# Patient Record
Sex: Male | Born: 1965 | Race: White | Hispanic: No | Marital: Single | Smoking: Current every day smoker
Health system: Southern US, Community
[De-identification: ages and names within clinical notes are randomized; demographics above are authoritative.]

## PROBLEM LIST (undated history)

## (undated) DIAGNOSIS — K859 Acute pancreatitis without necrosis or infection, unspecified: Secondary | ICD-10-CM

## (undated) DIAGNOSIS — S129XXA Fracture of neck, unspecified, initial encounter: Secondary | ICD-10-CM

## (undated) DIAGNOSIS — I10 Essential (primary) hypertension: Secondary | ICD-10-CM

## (undated) DIAGNOSIS — F319 Bipolar disorder, unspecified: Secondary | ICD-10-CM

## (undated) DIAGNOSIS — F333 Major depressive disorder, recurrent, severe with psychotic symptoms: Secondary | ICD-10-CM

## (undated) DIAGNOSIS — F1190 Opioid use, unspecified, uncomplicated: Secondary | ICD-10-CM

## (undated) DIAGNOSIS — G4700 Insomnia, unspecified: Secondary | ICD-10-CM

## (undated) DIAGNOSIS — F1010 Alcohol abuse, uncomplicated: Secondary | ICD-10-CM

## (undated) DIAGNOSIS — D649 Anemia, unspecified: Secondary | ICD-10-CM

## (undated) DIAGNOSIS — Z87898 Personal history of other specified conditions: Secondary | ICD-10-CM

---

## 2006-01-03 ENCOUNTER — Emergency Department: Payer: Self-pay | Admitting: Emergency Medicine

## 2006-02-03 ENCOUNTER — Emergency Department: Payer: Self-pay | Admitting: Emergency Medicine

## 2006-04-11 ENCOUNTER — Emergency Department: Payer: Self-pay | Admitting: Emergency Medicine

## 2006-04-13 ENCOUNTER — Emergency Department: Payer: Self-pay | Admitting: Internal Medicine

## 2011-03-21 ENCOUNTER — Emergency Department: Payer: Self-pay | Admitting: Unknown Physician Specialty

## 2012-07-12 DIAGNOSIS — Z87898 Personal history of other specified conditions: Secondary | ICD-10-CM | POA: Insufficient documentation

## 2013-08-25 DIAGNOSIS — K861 Other chronic pancreatitis: Secondary | ICD-10-CM | POA: Insufficient documentation

## 2013-08-25 DIAGNOSIS — F1020 Alcohol dependence, uncomplicated: Secondary | ICD-10-CM | POA: Insufficient documentation

## 2014-02-26 DIAGNOSIS — IMO0002 Reserved for concepts with insufficient information to code with codable children: Secondary | ICD-10-CM | POA: Insufficient documentation

## 2014-02-26 DIAGNOSIS — Z5982 Transportation insecurity: Secondary | ICD-10-CM | POA: Insufficient documentation

## 2014-03-20 DIAGNOSIS — K2981 Duodenitis with bleeding: Secondary | ICD-10-CM | POA: Insufficient documentation

## 2014-05-22 DIAGNOSIS — D649 Anemia, unspecified: Secondary | ICD-10-CM | POA: Insufficient documentation

## 2014-05-22 DIAGNOSIS — I10 Essential (primary) hypertension: Secondary | ICD-10-CM | POA: Insufficient documentation

## 2014-06-25 DIAGNOSIS — G8929 Other chronic pain: Secondary | ICD-10-CM | POA: Insufficient documentation

## 2014-07-22 DIAGNOSIS — F333 Major depressive disorder, recurrent, severe with psychotic symptoms: Secondary | ICD-10-CM | POA: Insufficient documentation

## 2014-07-22 DIAGNOSIS — G40409 Other generalized epilepsy and epileptic syndromes, not intractable, without status epilepticus: Secondary | ICD-10-CM | POA: Insufficient documentation

## 2014-10-02 DIAGNOSIS — K859 Acute pancreatitis without necrosis or infection, unspecified: Secondary | ICD-10-CM | POA: Insufficient documentation

## 2015-03-27 DIAGNOSIS — M958 Other specified acquired deformities of musculoskeletal system: Secondary | ICD-10-CM | POA: Insufficient documentation

## 2015-03-27 DIAGNOSIS — G5621 Lesion of ulnar nerve, right upper limb: Secondary | ICD-10-CM | POA: Insufficient documentation

## 2015-03-28 DIAGNOSIS — G8929 Other chronic pain: Secondary | ICD-10-CM | POA: Insufficient documentation

## 2015-03-28 DIAGNOSIS — M19019 Primary osteoarthritis, unspecified shoulder: Secondary | ICD-10-CM | POA: Insufficient documentation

## 2015-03-28 DIAGNOSIS — S248XXA Injury of other specified nerves of thorax, initial encounter: Secondary | ICD-10-CM | POA: Insufficient documentation

## 2015-10-30 DIAGNOSIS — R339 Retention of urine, unspecified: Secondary | ICD-10-CM | POA: Insufficient documentation

## 2015-10-30 DIAGNOSIS — R350 Frequency of micturition: Secondary | ICD-10-CM | POA: Insufficient documentation

## 2015-10-30 DIAGNOSIS — N3943 Post-void dribbling: Secondary | ICD-10-CM | POA: Insufficient documentation

## 2015-10-30 DIAGNOSIS — R351 Nocturia: Secondary | ICD-10-CM | POA: Insufficient documentation

## 2015-11-02 DIAGNOSIS — N5203 Combined arterial insufficiency and corporo-venous occlusive erectile dysfunction: Secondary | ICD-10-CM | POA: Insufficient documentation

## 2015-11-02 DIAGNOSIS — R3914 Feeling of incomplete bladder emptying: Secondary | ICD-10-CM | POA: Insufficient documentation

## 2016-11-09 DIAGNOSIS — L409 Psoriasis, unspecified: Secondary | ICD-10-CM | POA: Insufficient documentation

## 2019-06-07 ENCOUNTER — Encounter: Payer: Self-pay | Admitting: *Deleted

## 2019-06-07 ENCOUNTER — Emergency Department: Payer: Medicaid Other

## 2019-06-07 ENCOUNTER — Encounter
Admission: EM | Disposition: A | Discharge status: Home Health | Payer: Self-pay | Source: Home / Self Care | Attending: Internal Medicine

## 2019-06-07 ENCOUNTER — Other Ambulatory Visit: Payer: Self-pay

## 2019-06-07 ENCOUNTER — Inpatient Hospital Stay
Admission: EM | Admit: 2019-06-07 | Discharge: 2019-07-09 | DRG: 252 | Disposition: A | Discharge status: Home Health | Payer: Medicaid Other | Attending: Internal Medicine | Admitting: Internal Medicine

## 2019-06-07 DIAGNOSIS — Z515 Encounter for palliative care: Secondary | ICD-10-CM | POA: Diagnosis not present

## 2019-06-07 DIAGNOSIS — F17200 Nicotine dependence, unspecified, uncomplicated: Secondary | ICD-10-CM | POA: Diagnosis present

## 2019-06-07 DIAGNOSIS — I10 Essential (primary) hypertension: Secondary | ICD-10-CM | POA: Diagnosis present

## 2019-06-07 DIAGNOSIS — Z8669 Personal history of other diseases of the nervous system and sense organs: Secondary | ICD-10-CM

## 2019-06-07 DIAGNOSIS — Z20828 Contact with and (suspected) exposure to other viral communicable diseases: Secondary | ICD-10-CM | POA: Diagnosis present

## 2019-06-07 DIAGNOSIS — F250 Schizoaffective disorder, bipolar type: Secondary | ICD-10-CM | POA: Diagnosis present

## 2019-06-07 DIAGNOSIS — J9602 Acute respiratory failure with hypercapnia: Secondary | ICD-10-CM | POA: Diagnosis not present

## 2019-06-07 DIAGNOSIS — I998 Other disorder of circulatory system: Secondary | ICD-10-CM | POA: Diagnosis not present

## 2019-06-07 DIAGNOSIS — I70203 Unspecified atherosclerosis of native arteries of extremities, bilateral legs: Secondary | ICD-10-CM

## 2019-06-07 DIAGNOSIS — J156 Pneumonia due to other aerobic Gram-negative bacteria: Secondary | ICD-10-CM | POA: Diagnosis not present

## 2019-06-07 DIAGNOSIS — J984 Other disorders of lung: Secondary | ICD-10-CM

## 2019-06-07 DIAGNOSIS — G4700 Insomnia, unspecified: Secondary | ICD-10-CM | POA: Diagnosis present

## 2019-06-07 DIAGNOSIS — J150 Pneumonia due to Klebsiella pneumoniae: Secondary | ICD-10-CM | POA: Diagnosis not present

## 2019-06-07 DIAGNOSIS — F1290 Cannabis use, unspecified, uncomplicated: Secondary | ICD-10-CM | POA: Diagnosis present

## 2019-06-07 DIAGNOSIS — I959 Hypotension, unspecified: Secondary | ICD-10-CM | POA: Diagnosis not present

## 2019-06-07 DIAGNOSIS — M79662 Pain in left lower leg: Secondary | ICD-10-CM

## 2019-06-07 DIAGNOSIS — R0989 Other specified symptoms and signs involving the circulatory and respiratory systems: Secondary | ICD-10-CM

## 2019-06-07 DIAGNOSIS — R230 Cyanosis: Secondary | ICD-10-CM | POA: Diagnosis present

## 2019-06-07 DIAGNOSIS — Z981 Arthrodesis status: Secondary | ICD-10-CM | POA: Diagnosis not present

## 2019-06-07 DIAGNOSIS — Z01818 Encounter for other preprocedural examination: Secondary | ICD-10-CM

## 2019-06-07 DIAGNOSIS — Z885 Allergy status to narcotic agent status: Secondary | ICD-10-CM

## 2019-06-07 DIAGNOSIS — J690 Pneumonitis due to inhalation of food and vomit: Secondary | ICD-10-CM | POA: Diagnosis not present

## 2019-06-07 DIAGNOSIS — G9341 Metabolic encephalopathy: Secondary | ICD-10-CM | POA: Diagnosis not present

## 2019-06-07 DIAGNOSIS — Z8719 Personal history of other diseases of the digestive system: Secondary | ICD-10-CM

## 2019-06-07 DIAGNOSIS — Y92010 Kitchen of single-family (private) house as the place of occurrence of the external cause: Secondary | ICD-10-CM

## 2019-06-07 DIAGNOSIS — J9600 Acute respiratory failure, unspecified whether with hypoxia or hypercapnia: Secondary | ICD-10-CM

## 2019-06-07 DIAGNOSIS — S82851A Displaced trimalleolar fracture of right lower leg, initial encounter for closed fracture: Secondary | ICD-10-CM

## 2019-06-07 DIAGNOSIS — Q7290 Unspecified reduction defect of unspecified lower limb: Secondary | ICD-10-CM

## 2019-06-07 DIAGNOSIS — R410 Disorientation, unspecified: Secondary | ICD-10-CM | POA: Diagnosis not present

## 2019-06-07 DIAGNOSIS — M79661 Pain in right lower leg: Secondary | ICD-10-CM

## 2019-06-07 DIAGNOSIS — X501XXA Overexertion from prolonged static or awkward postures, initial encounter: Secondary | ICD-10-CM

## 2019-06-07 DIAGNOSIS — I70221 Atherosclerosis of native arteries of extremities with rest pain, right leg: Principal | ICD-10-CM | POA: Diagnosis present

## 2019-06-07 DIAGNOSIS — A498 Other bacterial infections of unspecified site: Secondary | ICD-10-CM | POA: Diagnosis not present

## 2019-06-07 DIAGNOSIS — J9601 Acute respiratory failure with hypoxia: Secondary | ICD-10-CM

## 2019-06-07 DIAGNOSIS — E538 Deficiency of other specified B group vitamins: Secondary | ICD-10-CM | POA: Diagnosis present

## 2019-06-07 DIAGNOSIS — Z4659 Encounter for fitting and adjustment of other gastrointestinal appliance and device: Secondary | ICD-10-CM

## 2019-06-07 DIAGNOSIS — W010XXA Fall on same level from slipping, tripping and stumbling without subsequent striking against object, initial encounter: Secondary | ICD-10-CM | POA: Diagnosis present

## 2019-06-07 DIAGNOSIS — F419 Anxiety disorder, unspecified: Secondary | ICD-10-CM | POA: Diagnosis present

## 2019-06-07 DIAGNOSIS — J9690 Respiratory failure, unspecified, unspecified whether with hypoxia or hypercapnia: Secondary | ICD-10-CM

## 2019-06-07 DIAGNOSIS — F10231 Alcohol dependence with withdrawal delirium: Secondary | ICD-10-CM | POA: Diagnosis not present

## 2019-06-07 DIAGNOSIS — I745 Embolism and thrombosis of iliac artery: Secondary | ICD-10-CM | POA: Diagnosis present

## 2019-06-07 DIAGNOSIS — M7989 Other specified soft tissue disorders: Secondary | ICD-10-CM

## 2019-06-07 DIAGNOSIS — Z7189 Other specified counseling: Secondary | ICD-10-CM | POA: Diagnosis not present

## 2019-06-07 DIAGNOSIS — I82409 Acute embolism and thrombosis of unspecified deep veins of unspecified lower extremity: Secondary | ICD-10-CM

## 2019-06-07 DIAGNOSIS — F05 Delirium due to known physiological condition: Secondary | ICD-10-CM | POA: Diagnosis not present

## 2019-06-07 DIAGNOSIS — F1120 Opioid dependence, uncomplicated: Secondary | ICD-10-CM | POA: Diagnosis present

## 2019-06-07 DIAGNOSIS — Z9114 Patient's other noncompliance with medication regimen: Secondary | ICD-10-CM

## 2019-06-07 DIAGNOSIS — Z95828 Presence of other vascular implants and grafts: Secondary | ICD-10-CM

## 2019-06-07 DIAGNOSIS — R0902 Hypoxemia: Secondary | ICD-10-CM

## 2019-06-07 DIAGNOSIS — Z79899 Other long term (current) drug therapy: Secondary | ICD-10-CM

## 2019-06-07 DIAGNOSIS — Z0189 Encounter for other specified special examinations: Secondary | ICD-10-CM

## 2019-06-07 DIAGNOSIS — Z781 Physical restraint status: Secondary | ICD-10-CM

## 2019-06-07 DIAGNOSIS — S82891A Other fracture of right lower leg, initial encounter for closed fracture: Secondary | ICD-10-CM

## 2019-06-07 HISTORY — DX: Bipolar disorder, unspecified: F31.9

## 2019-06-07 HISTORY — DX: Major depressive disorder, recurrent, severe with psychotic symptoms: F33.3

## 2019-06-07 HISTORY — DX: Acute pancreatitis without necrosis or infection, unspecified: K85.90

## 2019-06-07 HISTORY — PX: LOWER EXTREMITY ANGIOGRAPHY: CATH118251

## 2019-06-07 HISTORY — DX: Insomnia, unspecified: G47.00

## 2019-06-07 HISTORY — DX: Personal history of other specified conditions: Z87.898

## 2019-06-07 HISTORY — DX: Fracture of neck, unspecified, initial encounter: S12.9XXA

## 2019-06-07 HISTORY — DX: Opioid use, unspecified, uncomplicated: F11.90

## 2019-06-07 HISTORY — DX: Essential (primary) hypertension: I10

## 2019-06-07 HISTORY — DX: Anemia, unspecified: D64.9

## 2019-06-07 HISTORY — DX: Alcohol abuse, uncomplicated: F10.10

## 2019-06-07 LAB — PROTIME-INR
INR: 1 (ref 0.8–1.2)
Prothrombin Time: 12.7 seconds (ref 11.4–15.2)

## 2019-06-07 LAB — BASIC METABOLIC PANEL
Anion gap: 8 (ref 5–15)
BUN: 18 mg/dL (ref 6–20)
CO2: 25 mmol/L (ref 22–32)
Calcium: 8.5 mg/dL — ABNORMAL LOW (ref 8.9–10.3)
Chloride: 103 mmol/L (ref 98–111)
Creatinine, Ser: 1.58 mg/dL — ABNORMAL HIGH (ref 0.61–1.24)
GFR calc Af Amer: 57 mL/min — ABNORMAL LOW (ref >60)
GFR calc non Af Amer: 49 mL/min — ABNORMAL LOW (ref >60)
Glucose, Bld: 96 mg/dL (ref 70–99)
Potassium: 4.3 mmol/L (ref 3.5–5.1)
Sodium: 136 mmol/L (ref 135–145)

## 2019-06-07 LAB — CBC
HCT: 46.7 % (ref 39.0–52.0)
Hemoglobin: 15.6 g/dL (ref 13.0–17.0)
MCH: 33.9 pg (ref 26.0–34.0)
MCHC: 33.4 g/dL (ref 30.0–36.0)
MCV: 101.5 fL — ABNORMAL HIGH (ref 80.0–100.0)
Platelets: 131 10*3/uL — ABNORMAL LOW (ref 150–400)
RBC: 4.6 MIL/uL (ref 4.22–5.81)
RDW: 13.8 % (ref 11.5–15.5)
WBC: 8 10*3/uL (ref 4.0–10.5)
nRBC: 0 % (ref 0.0–0.2)

## 2019-06-07 LAB — SARS CORONAVIRUS 2 BY RT PCR (HOSPITAL ORDER, PERFORMED IN ~~LOC~~ HOSPITAL LAB): SARS Coronavirus 2: NEGATIVE

## 2019-06-07 SURGERY — LOWER EXTREMITY ANGIOGRAPHY
Anesthesia: Moderate Sedation | Laterality: Right

## 2019-06-07 MED ORDER — HEPARIN SODIUM (PORCINE) 1000 UNIT/ML IJ SOLN
INTRAMUSCULAR | Status: AC
  Filled 2019-06-07: qty 1

## 2019-06-07 MED ORDER — FENTANYL CITRATE (PF) 100 MCG/2ML IJ SOLN
INTRAMUSCULAR | Status: AC
  Filled 2019-06-07: qty 8

## 2019-06-07 MED ORDER — MIDAZOLAM HCL 2 MG/2ML IJ SOLN
INTRAMUSCULAR | Status: AC
  Filled 2019-06-07: qty 8

## 2019-06-07 MED ORDER — SODIUM CHLORIDE 0.9 % IV SOLN: 250.0000 mL | INTRAVENOUS | Status: DC | PRN

## 2019-06-07 MED ORDER — SODIUM CHLORIDE 0.9% FLUSH
3.0000 mL | Freq: Two times a day (BID) | INTRAVENOUS | Status: DC
  Administered 2019-06-09: 22:00:00 3 mL via INTRAVENOUS

## 2019-06-07 MED ORDER — FENTANYL CITRATE (PF) 100 MCG/2ML IJ SOLN
INTRAMUSCULAR | Status: AC
  Filled 2019-06-07: qty 2

## 2019-06-07 MED ORDER — HEPARIN BOLUS VIA INFUSION
2000.0000 [IU] | Freq: Once | INTRAVENOUS | Status: AC
  Administered 2019-06-08: 2000 [IU] via INTRAVENOUS
  Filled 2019-06-07: qty 2000

## 2019-06-07 MED ORDER — HEPARIN (PORCINE) 25000 UT/250ML-% IV SOLN
1100.0000 [IU]/h | INTRAVENOUS | Status: DC
  Administered 2019-06-08: 1100 [IU]/h via INTRAVENOUS
  Filled 2019-06-07: qty 250

## 2019-06-07 MED ORDER — MIDAZOLAM HCL 2 MG/2ML IJ SOLN
INTRAMUSCULAR | Status: AC | PRN
  Administered 2019-06-07: 2 mg via INTRAVENOUS

## 2019-06-07 MED ORDER — ATORVASTATIN CALCIUM 10 MG PO TABS: 10.0000 mg | ORAL_TABLET | Freq: Every day | ORAL | Status: DC

## 2019-06-07 MED ORDER — ONDANSETRON HCL 4 MG/2ML IJ SOLN: 4.0000 mg | Freq: Four times a day (QID) | INTRAMUSCULAR | Status: DC | PRN

## 2019-06-07 MED ORDER — HYDROMORPHONE HCL 1 MG/ML IJ SOLN
1.0000 mg | Freq: Once | INTRAMUSCULAR | Status: AC
  Administered 2019-06-07: 1 mg via INTRAVENOUS
  Filled 2019-06-07: qty 1

## 2019-06-07 MED ORDER — CEFAZOLIN SODIUM-DEXTROSE 1-4 GM/50ML-% IV SOLN: 1.0000 g | INTRAVENOUS | Status: DC

## 2019-06-07 MED ORDER — INFLUENZA VAC SPLIT QUAD 0.5 ML IM SUSY: 0.5000 mL | PREFILLED_SYRINGE | INTRAMUSCULAR | Status: DC

## 2019-06-07 MED ORDER — OXYCODONE HCL 5 MG PO TABS
5.0000 mg | ORAL_TABLET | ORAL | Status: DC | PRN
  Administered 2019-06-08: 10 mg via ORAL
  Filled 2019-06-07 (×2): qty 1

## 2019-06-07 MED ORDER — SODIUM CHLORIDE 0.9% FLUSH
3.0000 mL | INTRAVENOUS | Status: DC | PRN
  Administered 2019-06-16 – 2019-07-09 (×4): 3 mL via INTRAVENOUS
  Filled 2019-06-07 (×4): qty 3

## 2019-06-07 MED ORDER — MIDAZOLAM HCL 5 MG/5ML IJ SOLN
INTRAMUSCULAR | Status: AC
  Filled 2019-06-07: qty 5

## 2019-06-07 MED ORDER — FENTANYL CITRATE (PF) 100 MCG/2ML IJ SOLN
INTRAMUSCULAR | Status: DC | PRN
  Administered 2019-06-07 (×3): 25 ug via INTRAVENOUS
  Administered 2019-06-07: 50 ug via INTRAVENOUS

## 2019-06-07 MED ORDER — HYDROMORPHONE HCL 1 MG/ML IJ SOLN
0.5000 mg | INTRAMUSCULAR | Status: DC | PRN
  Administered 2019-06-08: 1 mg via INTRAVENOUS
  Filled 2019-06-07: qty 1

## 2019-06-07 MED ORDER — HEPARIN SODIUM (PORCINE) 1000 UNIT/ML IJ SOLN
INTRAMUSCULAR | Status: DC | PRN
  Administered 2019-06-07: 4000 [IU] via INTRAVENOUS

## 2019-06-07 MED ORDER — MIDAZOLAM HCL 2 MG/2ML IJ SOLN
INTRAMUSCULAR | Status: DC | PRN
  Administered 2019-06-07: 1 mg via INTRAVENOUS
  Administered 2019-06-07: 2 mg via INTRAVENOUS
  Administered 2019-06-07 (×2): 1 mg via INTRAVENOUS

## 2019-06-07 MED ORDER — SODIUM CHLORIDE 0.9 % IV SOLN: INTRAVENOUS | Status: DC

## 2019-06-07 MED ORDER — ACETAMINOPHEN 325 MG PO TABS: 650.0000 mg | ORAL_TABLET | ORAL | Status: DC | PRN

## 2019-06-07 MED ORDER — FENTANYL CITRATE (PF) 100 MCG/2ML IJ SOLN
INTRAMUSCULAR | Status: AC | PRN
  Administered 2019-06-07: 50 ug via INTRAVENOUS
  Administered 2019-06-07: 100 ug via INTRAVENOUS

## 2019-06-07 MED ORDER — SODIUM CHLORIDE 0.9 % IV SOLN
INTRAVENOUS | Status: DC
  Administered 2019-06-07: 23:00:00 via INTRAVENOUS

## 2019-06-07 SURGICAL SUPPLY — 27 items
BALLN LUTONIX 4X220X130 (BALLOONS) ×3
BALLN LUTONIX 5X150X130 (BALLOONS) ×3
BALLN LUTONIX DCB 5X40X130 (BALLOONS) ×3
BALLN LUTONIX DCB 5X60X130 (BALLOONS) ×3
BALLN LUTONIX DCB 7X60X130 (BALLOONS) ×3
BALLOON LUTONIX 4X220X130 (BALLOONS) ×1 IMPLANT
BALLOON LUTONIX 5X150X130 (BALLOONS) ×1 IMPLANT
BALLOON LUTONIX DCB 5X40X130 (BALLOONS) ×1 IMPLANT
BALLOON LUTONIX DCB 5X60X130 (BALLOONS) ×1 IMPLANT
BALLOON LUTONIX DCB 7X60X130 (BALLOONS) ×1 IMPLANT
CATH BEACON 5 .035 65 KMP TIP (CATHETERS) ×3 IMPLANT
CATH BEACON 5 .038 100 VERT TP (CATHETERS) ×3 IMPLANT
CATH PIG 70CM (CATHETERS) ×3 IMPLANT
CATH VERT 5FR 125CM (CATHETERS) ×3 IMPLANT
DEVICE PRESTO INFLATION (MISCELLANEOUS) ×3 IMPLANT
DEVICE STARCLOSE SE CLOSURE (Vascular Products) ×3 IMPLANT
GLIDEWIRE ADV .035X260CM (WIRE) ×3 IMPLANT
NEEDLE ENTRY 21GA 7CM ECHOTIP (NEEDLE) ×3 IMPLANT
PACK ANGIOGRAPHY (CUSTOM PROCEDURE TRAY) ×3 IMPLANT
SET INTRO CAPELLA COAXIAL (SET/KITS/TRAYS/PACK) ×3 IMPLANT
SHEATH ANL2 6FRX45 HC (SHEATH) ×3 IMPLANT
SHEATH BRITE TIP 5FRX11 (SHEATH) ×3 IMPLANT
STENT LIFESTAR 8X40 (Permanent Stent) ×3 IMPLANT
STENT LIFESTENT 5F 6X40X135 (Permanent Stent) ×3 IMPLANT
SYR MEDRAD MARK 7 150ML (SYRINGE) ×3 IMPLANT
TUBING CONTRAST HIGH PRESS 72 (TUBING) ×3 IMPLANT
WIRE J 3MM .035X145CM (WIRE) ×3 IMPLANT

## 2019-06-07 NOTE — ED Provider Notes (Signed)
Personally saw and evaluated this patient.   ____________________________________________  RADIOLOGY  Dg Ankle 2 Views Right  Result Date: 06/07/2019 CLINICAL DATA:  Medial and lateral malleolar fracture subluxation status post reduction. EXAM: RIGHT ANKLE - 2 VIEW COMPARISON:  Right ankle x-rays from same day. FINDINGS: Improved alignment of the medial malleolar and distal fibular fractures status post reduction. Residual 2 mm posterior displacement of the distal fibular fracture with 1.5 cm overriding. Slight residual widening of the medial clear space. Joint spaces are preserved. Bone mineralization is normal. Unchanged diffuse soft tissue swelling. IMPRESSION: Improved alignment status post reduction of the medial and lateral malleolar fractures. Electronically Signed   By: Obie DredgeWilliam T Derry M.D.   On: 06/07/2019 20:18   Dg Ankle Complete Right  Result Date: 06/07/2019 CLINICAL DATA:  Per EMS report, patient slipped and fell in his kitchen and was witnessed by his wife. Patient has deformity to right ankle. Patient was given 50mcg of Fentanyl en route by EMT. Patient denies relief of the pain. Patient arrives with right lower leg splinted. EXAM: RIGHT ANKLE - COMPLETE 3+ VIEW COMPARISON:  None. FINDINGS: There is a transverse fracture across the base of the medial malleolus and an oblique fracture of the distal fibula extending from the metadiaphysis to the distal metaphysis at the level of the ankle joint. The talus is subluxed posteriorly and laterally by approximately 5 mm. The distal fibular fracture is displaced posteriorly by 1.3 cm. There is surrounding soft tissue swelling. IMPRESSION: 1. Displaced fractures of the medial malleolus and distal fibula with subluxation of the talus as detailed. No dislocation. Electronically Signed   By: Amie Portlandavid  Ormond M.D.   On: 06/07/2019 18:41    ____________________________________________  Procedure(s) performed: Moderate sedation, right ankle reduction,  see procedure note(s).  .Sedation  Date/Time: 06/07/2019 7:38 PM Performed by: Sharyn CreamerQuale, Ceasar Decandia, MD Authorized by: Sharyn CreamerQuale, Sotero Brinkmeyer, MD   Consent:    Consent obtained:  Written (electronic informed consent)   Consent given by:  Patient   Risks discussed:  Allergic reaction, dysrhythmia, inadequate sedation, nausea, respiratory compromise necessitating ventilatory assistance and intubation, prolonged sedation necessitating reversal and prolonged hypoxia resulting in organ damage   Alternatives discussed:  Analgesia without sedation Universal protocol:    Procedure explained and questions answered to patient or proxy's satisfaction: yes     Relevant documents present and verified: yes     Test results available and properly labeled: yes     Imaging studies available: yes     Required blood products, implants, devices, and special equipment available: yes     Site/side marked: yes     Immediately prior to procedure a time out was called: yes     Patient identity confirmation method:  Arm band Indications:    Procedure performed:  Dislocation reduction   Procedure necessitating sedation performed by:  Physician performing sedation Pre-sedation assessment:    NPO status caution: urgency dictates proceeding with non-ideal NPO status     ASA classification: class 2 - patient with mild systemic disease     Neck mobility: normal     Mouth opening:  2 finger widths   Thyromental distance:  3 finger widths   Mallampati score:  II - soft palate, uvula, fauces visible   Pre-sedation assessments completed and reviewed: airway patency, cardiovascular function, hydration status, mental status, nausea/vomiting, pain level, respiratory function and temperature     Pre-sedation assessment completed:  05/31/2019 6:50 PM Immediate pre-procedure details:    Reassessment: Patient reassessed immediately prior to  procedure     Reviewed: vital signs, relevant labs/tests and NPO status     Verified: bag valve mask  available, emergency equipment available, intubation equipment available, IV patency confirmed, oxygen available, reversal medications available and suction available   Procedure details (see MAR for exact dosages):    Preoxygenation:  Nasal cannula   Analgesia:  Fentanyl (versed as anxiolysis)   Intra-procedure monitoring:  Blood pressure monitoring, continuous pulse oximetry, cardiac monitor, frequent vital sign checks and frequent LOC assessments   Intra-procedure events: none     Total Provider sedation time (minutes):  20 Post-procedure details:    Post-sedation assessment completed:  06/07/2019 7:40 PM   Attendance: Constant attendance by certified staff until patient recovered     Recovery: Patient returned to pre-procedure baseline     Post-sedation assessments completed and reviewed: airway patency, cardiovascular function, hydration status, mental status, nausea/vomiting, pain level and respiratory function     Patient is stable for discharge or admission: yes     Patient tolerance:  Tolerated well, no immediate complications Reduction of fracture  Date/Time: 06/07/2019 7:41 PM Performed by: Delman Kitten, MD Authorized by: Delman Kitten, MD  Consent: Verbal consent obtained. Risks and benefits: risks, benefits and alternatives were discussed Consent given by: patient Patient understanding: patient states understanding of the procedure being performed Patient consent: the patient's understanding of the procedure matches consent given Procedure consent: procedure consent matches procedure scheduled Relevant documents: relevant documents present and verified Test results: test results available and properly labeled Site marked: the operative site was marked Imaging studies: imaging studies available Required items: required blood products, implants, devices, and special equipment available Patient identity confirmed: arm band Time out: Immediately prior to procedure a "time out" was  called to verify the correct patient, procedure, equipment, support staff and site/side marked as required. Local anesthesia used: no  Anesthesia: Local anesthesia used: no  Sedation: Patient sedated: yes Sedation type: moderate (conscious) sedation  Patient tolerance: patient tolerated the procedure well with no immediate complications     Critical Care performed: Yes, see critical care note(s)  CRITICAL CARE Performed by: Delman Kitten   Total critical care time: 65 minutes  Critical care time was exclusive of separately billable procedures and treating other patients.  Critical care was necessary to treat or prevent imminent or life-threatening deterioration.  Critical care was time spent personally by me on the following activities: development of treatment plan with patient and/or surrogate as well as nursing, discussions with consultants, evaluation of patient's response to treatment, examination of patient, obtaining history from patient or surrogate, ordering and performing treatments and interventions, ordering and review of laboratory studies, ordering and review of radiographic studies, pulse oximetry and re-evaluation of patient's condition.   Clinical Course as of Jun 06 2045  Thu Jun 07, 2019  1839 Patient alert, consents to sedation and reduction for R ankle injury. Emergent basis due to pulseless R foot.   [MQ]  1839 Paged ortho for stat consult (Dr. Sabra Heck) at this time   [MQ]  1858 Dr. Sabra Heck returned page. Stat consult placed. Will see patient shortly.    [MQ]  2015 Dr. Ronalee Belts, vascular surgery at bedside with patient now.   [MQ]    Clinical Course User Index [MQ] Delman Kitten, MD   ----------------------------------------- 8:46 PM on 06/07/2019 -----------------------------------------  Have discussed the case with both orthopedics as well as vascular surgery of both seen and evaluated the patient in the ER at this time.  Patient currently posted  to go  to the OR with vascular surgery for further work-up regarding his right foot.  Dr. Hyacinth Meeker consulting with Dr. Cyril Loosen Devian Bartolomei was evaluated in Emergency Department on 06/07/2019 for the symptoms described in the history of present illness. He was evaluated in the context of the global COVID-19 pandemic, which necessitated consideration that the patient might be at risk for infection with the SARS-CoV-2 virus that causes COVID-19. Institutional protocols and algorithms that pertain to the evaluation of patients at risk for COVID-19 are in a state of rapid change based on information released by regulatory bodies including the CDC and federal and state organizations. These policies and algorithms were followed during the patient's care in the ED.  COVID negative  ____________________________________________   FINAL CLINICAL IMPRESSION(S) / ED DIAGNOSES  Final diagnoses:  Closed fracture of right ankle, initial encounter       Sharyn Creamer, MD 06/07/19 2047

## 2019-06-07 NOTE — ED Notes (Signed)
Report given to [facility] (vasc lab)

## 2019-06-07 NOTE — H&P (Signed)
@LOGO @   MRN : [id number]  Thomas [name] is a 53 y.o. ([birth year]) male who presents with chief complaint of  Chief Complaint  Patient presents with  . Fall  .  History of Present Illness: I am asked to evaluate the patient by Dr. Jacqualine Code for loss of Doppler signal in the right foot.  The patient is a 53 year old gentleman who sustained a right ankle fracture earlier today.  Is brought to the emergency room by EMS.  He subsequently underwent closed reduction in the emergency room.  On evaluation by ER staff he was noted to have a cool foot with mild cyanosis and absence of Doppler signals.  By history the patient has known peripheral vascular disease.  ABIs obtained several months ago at [facility] were significant for a right ABI of 0.4 with absence of Doppler signals in the great toe.  Patient denies baseline rest pain symptoms or difficulty with walking.  He denies claudication.  He denies recent open wounds or nonhealing ulcerations.  At the time of my interview he is complaining of intense ankle pain.   No outpatient medications have been marked as taking for the 06/07/19 encounter [facility] Encounter).    No past medical history on file.    Social History Social History   Tobacco Use  . Smoking status: Current Every Day Smoker  . Smokeless tobacco: Never Used  Substance Use Topics  . Alcohol use: Yes    Comment: occasionally  . Drug use: Not on file    Family History No family history on file.  Allergies  Allergen Reactions  . Haldol [Haloperidol Lactate]      REVIEW OF SYSTEMS (Negative unless checked)  Constitutional: [] Weight loss  [] Fever  [] Chills Cardiac: [] Chest pain   [] Chest pressure   [] Palpitations   [] Shortness of breath when laying flat   [] Shortness of breath with exertion. Vascular:  [] Pain in legs with walking   [x] Pain in legs at rest  [] History of DVT   [] Phlebitis   [x] Swelling in legs   [] Varicose veins   [] Non-healing ulcers Pulmonary:    [] Uses home oxygen   [] Productive cough   [] Hemoptysis   [] Wheeze  [] COPD   [] Asthma Neurologic:  [] Dizziness   [] Seizures   [] History of stroke   [] History of TIA  [] Aphasia   [] Vissual changes   [] Weakness or numbness in arm   [] Weakness or numbness in leg Musculoskeletal:   [] Joint swelling   [x] Joint pain   [] Low back pain Hematologic:  [] Easy bruising  [] Easy bleeding   [] Hypercoagulable state   [] Anemic Gastrointestinal:  [] Diarrhea   [] Vomiting  [] Gastroesophageal reflux/heartburn   [] Difficulty swallowing. Genitourinary:  [] Chronic kidney disease   [] Difficult urination  [] Frequent urination   [] Blood in urine Skin:  [] Rashes   [] Ulcers  Psychological:  [] History of anxiety   []  History of major depression.  Physical Examination  Vitals:   06/07/19 1916 06/07/19 1920 06/07/19 1934 06/07/19 2000  BP: (!) 120/104 94/62  91/69  Pulse: 79 76 79 72  Resp: 13 16 17 15   Temp:      TempSrc:      SpO2: 94% 100% 97% 100%   There is no height or weight on file to calculate BMI. Gen: WD/WN, NAD Head: [place]/AT, No temporalis wasting.  Ear/Nose/Throat: Hearing grossly intact, nares w/o erythema or drainage Eyes: PER, EOMI, sclera nonicteric.  Neck: Supple, no large masses.   Pulmonary:  Good air movement, no audible wheezing bilaterally, no use of accessory  muscles.  Cardiac: RRR, no JVD Vascular: Right foot is cyanotic with very poor capillary refill it is quite cool to the touch.  I am unable to obtain Doppler signals in the dorsalis pedis posterior tibial or peroneal distributions.  In contrast on the left I am able to obtain a monophasic to biphasic posterior tibial signal I am unable to obtain a DP signal. Vessel Right Left  PT  not palpable  not palpable  DP  not palpable  not palpable  Gastrointestinal: Non-distended. No guarding/no peritoneal signs.  Musculoskeletal: M/S 5/5 throughout.  Right ankle deformity consistent with his recent fracture dislocation but no atrophy.   Neurologic: CN 2-12 intact. Symmetrical.  Speech is fluent. Motor exam as listed above. Psychiatric: Judgment intact, Mood & affect appropriate for pt's clinical situation. Dermatologic: No rashes or ulcers noted.  No changes consistent with cellulitis. Lymph : No lichenification or skin changes of chronic lymphedema.  CBC Lab Results  Component Value Date   WBC 8.0 06/07/2019   HGB 15.6 06/07/2019   HCT 46.7 06/07/2019   MCV 101.5 (H) 06/07/2019   PLT 131 (L) 06/07/2019    BMET    Component Value Date/Time   NA 136 06/07/2019 1834   K 4.3 06/07/2019 1834   CL 103 06/07/2019 1834   CO2 25 06/07/2019 1834   GLUCOSE 96 06/07/2019 1834   BUN 18 06/07/2019 1834   CREATININE 1.58 (H) 06/07/2019 1834   CALCIUM 8.5 (L) 06/07/2019 1834   GFRNONAA 49 (L) 06/07/2019 1834   GFRAA 57 (L) 06/07/2019 1834   CrCl cannot be calculated (Unknown ideal weight.).  COAG Lab Results  Component Value Date   INR 1.0 06/07/2019    Radiology Dg Ankle 2 Views Right  Result Date: 06/07/2019 CLINICAL DATA:  Medial and lateral malleolar fracture subluxation status post reduction. EXAM: RIGHT ANKLE - 2 VIEW COMPARISON:  Right ankle x-rays from same day. FINDINGS: Improved alignment of the medial malleolar and distal fibular fractures status post reduction. Residual 2 mm posterior displacement of the distal fibular fracture with 1.5 cm overriding. Slight residual widening of the medial clear space. Joint spaces are preserved. Bone mineralization is normal. Unchanged diffuse soft tissue swelling. IMPRESSION: Improved alignment status post reduction of the medial and lateral malleolar fractures. Electronically Signed   By: Obie Dredge M.D.   On: 06/07/2019 20:18   Dg Ankle Complete Right  Result Date: 06/07/2019 CLINICAL DATA:  Per EMS report, patient slipped and fell in his kitchen and was witnessed by his wife. Patient has deformity to right ankle. Patient was given of Fentanyl en route by  EMT. Patient denies relief of the pain. Patient arrives with right lower leg splinted. EXAM: RIGHT ANKLE - COMPLETE 3+ VIEW COMPARISON:  None. FINDINGS: There is a transverse fracture across the base of the medial malleolus and an oblique fracture of the distal fibula extending from the metadiaphysis to the distal metaphysis at the level of the ankle joint. The talus is subluxed posteriorly and laterally by approximately 5 mm. The distal fibular fracture is displaced posteriorly by 1.3 cm. There is surrounding soft tissue swelling. IMPRESSION: 1. Displaced fractures of the medial malleolus and distal fibula with subluxation of the talus as detailed. No dislocation. Electronically Signed   By: Amie Portland M.D.   On: 06/07/2019 18:41    Assessment/Plan 1.  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  The risks and benefits of been reviewed all questions have been answered and the patient agrees to proceed with angiography for limb salvage.  2.  Fracture dislocation right ankle: I have personally reviewed the films and have discussed this injury with Dr. Hyacinth MeekerMiller the orthopedic surgeon on-call.  The plan will be to move forward with angiography to prevent limb loss and keep his foot splinted as he is adequately reduced at this time.  Further assessment regarding surgical repair will be rendered at a later time point once his vascular status has been secured. Levora DredgeGregory Nevia Henkin, MD  06/07/2019 8:52 PM

## 2019-06-07 NOTE — ED Triage Notes (Addendum)
Per EMS report, patient slipped and fell in his kitchen and was witnessed by his wife. Patient has deformity to right ankle. Patient was given 47mcg of Fentanyl en route by EMT. Patient denies relief of the pain. Patient arrives with right lower leg splinted.

## 2019-06-07 NOTE — Consult Note (Signed)
ORTHOPAEDIC CONSULTATION  REQUESTING PHYSICIAN: Sharyn Creamer, MD  Chief Complaint: Right ankle fracture with vascular compromise  HPI: Thomas [name] is a 53 y.o. male who complains of right ankle fracture after getting up from his recliner and stumbling this evening.  He suffered a injury to the right ankle.  On exam and x-rays in the emergency room tonight he is noted to have a trimalleolar fracture dislocation of the ankle.  The dislocation appeared to be straight posteriorly.  In addition a foot and ankle were found to be cold and pulseless even by Doppler.  Vascular surgery consultation was obtained and Dr. Gilda Crease has seen the patient.  Vascular compromise outweighs any concerns for the fracture.  The ankle joint was reduced by Dr. Fanny Bien in the emergency room and post reduction x-rays show satisfactory position of the talus under the tibia.  There is mild   displacement of the medial malleolus and lateral malleolus.  There is a small fragment off the posterior malleolus.  Review of outside records from [facility] system shows that the patient had vascular studies at the vascular diagnostic laboratory at [facility] on 02/02/2019.  This showed an ankle-brachial index of on the right of 0.48.  On the left it was 1.06.  The right ankle posterior tibial pressure was 66 mm the dorsal pedis pressure 56 mm and the digital pressure was absent.  No further evaluation or procedures were done.  He has a extensive medical history including bipolar disease, severe cervical spine fracture with fusions in 2013 and resultant right arm neurologic damage, seizures, carotid artery calcification, relapsing pancreatitis, severe alcohol use disorder, vitamin B12 deficiency, folate deficiency, duodenitis with bleeding, hypertension, anemia, and other associated problems.  I have discussed the treatment with Dr. Gilda Crease.  He feels that the patient will need a catheterization today.  The ankle is reduced and  stabilized and he does not feel that we need to proceed with any fixation tonight.  Will discuss orthopedic procedures depending on the condition of the foot and ankle later.  No past medical history on file. History reviewed. No pertinent surgical history. Social History   Socioeconomic History  . Marital status: Single    Spouse name: Not on file  . Number of children: Not on file  . Years of education: Not on file  . Highest education level: Not on file  Occupational History  . Not on file  Social Needs  . Financial resource strain: Not on file  . Food insecurity    Worry: Not on file    Inability: Not on file  . Transportation needs    Medical: Not on file    Non-medical: Not on file  Tobacco Use  . Smoking status: Current Every Day Smoker  . Smokeless tobacco: Never Used  Substance and Sexual Activity  . Alcohol use: Yes    Comment: occasionally  . Drug use: Not on file  . Sexual activity: Not on file  Lifestyle  . Physical activity    Days per week: Not on file    Minutes per session: Not on file  . Stress: Not on file  Relationships  . Social Musician on phone: Not on file    Gets together: Not on file    Attends religious service: Not on file    Active member of club or organization: Not on file    Attends meetings of clubs or organizations: Not on file    Relationship status: Not  on file  Other Topics Concern  . Not on file  Social History Narrative  . Not on file   No family history on file. Allergies  Allergen Reactions  . Haldol [Haloperidol Lactate]    Prior to Admission medications   Not on File   Dg Ankle 2 Views Right  Result Date: 06/07/2019 CLINICAL DATA:  Medial and lateral malleolar fracture subluxation status post reduction. EXAM: RIGHT ANKLE - 2 VIEW COMPARISON:  Right ankle x-rays from same day. FINDINGS: Improved alignment of the medial malleolar and distal fibular fractures status post reduction. Residual 2 mm posterior  displacement of the distal fibular fracture with 1.5 cm overriding. Slight residual widening of the medial clear space. Joint spaces are preserved. Bone mineralization is normal. Unchanged diffuse soft tissue swelling. IMPRESSION: Improved alignment status post reduction of the medial and lateral malleolar fractures. Electronically Signed   By: Titus Dubin M.D.   On: 06/07/2019 20:18   Dg Ankle Complete Right  Result Date: 06/07/2019 CLINICAL DATA:  Per EMS report, patient slipped and fell in his kitchen and was witnessed by his wife. Patient has deformity to right ankle. Patient was given 8mcg of Fentanyl en route by EMT. Patient denies relief of the pain. Patient arrives with right lower leg splinted. EXAM: RIGHT ANKLE - COMPLETE 3+ VIEW COMPARISON:  None. FINDINGS: There is a transverse fracture across the base of the medial malleolus and an oblique fracture of the distal fibula extending from the metadiaphysis to the distal metaphysis at the level of the ankle joint. The talus is subluxed posteriorly and laterally by approximately 5 mm. The distal fibular fracture is displaced posteriorly by 1.3 cm. There is surrounding soft tissue swelling. IMPRESSION: 1. Displaced fractures of the medial malleolus and distal fibula with subluxation of the talus as detailed. No dislocation. Electronically Signed   By: Lajean Manes M.D.   On: 06/07/2019 18:41    Positive ROS: All other systems have been reviewed and were otherwise negative with the exception of those mentioned in the HPI and as above.  Physical Exam: General: Alert, no acute distress Cardiovascular: No pedal edema Respiratory: No cyanosis, no use of accessory musculature GI: No organomegaly, abdomen is soft and non-tender Skin: No lesions in the area of chief complaint Neurologic: Sensation intact distally Psychiatric: Patient is competent for consent with normal mood and affect Lymphatic: No axillary or cervical  lymphadenopathy  MUSCULOSKELETAL: Patient is alert and awake.  He is complaining of intense pain.  Right ankle is splinted.  Skin is visible and there is moderate swelling.  The there is no the skin is intact.  He is able to move the toes dorsally and volarly.  He has gross sensation.  Toes are cool.  No other injuries are noted.  Assessment: Trimalleolar fracture dislocation right ankle with vascular compromise due to severe peripheral vascular disease  Plan: Dr. Delana Meyer will deal with the vascular condition tonight.  We will follow along to consider orthopedic stabilization at a later time.  He will remain splinted as is.    Park Breed, MD [phone]   06/07/2019 8:55 PM

## 2019-06-07 NOTE — Op Note (Signed)
[facility] Percutaneous Study/Intervention Procedural Note   Date of Surgery: 06/07/2019  Surgeon:  Katha Cabal, MD.  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 Introduction catheter into right lower extremity 3rd order catheter placement  2. Contrast injection right lower extremity for distal runoff   3. Percutaneous transluminal angioplasty and stent placement right superficial femoral artery  4. Percutaneous transluminal angioplasty and stent placement right external iliac artery             5.  Star close closure left common femoral arteriotomy  Anesthesia: Conscious sedation was administered under my direct supervision by the interventional radiology RN. IV Versed plus fentanyl were utilized. Continuous ECG, pulse oximetry and blood pressure was monitored throughout the entire procedure.  Conscious sedation was for a total of 70 minutes.  Sheath: 6 [place] Ansell left common femoral retrograde  Contrast: 150 cc  Fluoroscopy Time: 15.0 minutes  Indications: Thomas [name] presents with increasing pain of his right foot.  He is status post fracture dislocation which was reduced in the emergency room.  Postreduction his foot appears pale and cold and we are unable to obtain Doppler signals.  This represents a limb threatening ischemia.  Angiography with the hope for intervention for limb salvage recommended.  The risks and benefits are reviewed all questions answered patient agrees to proceed.  Procedure: Thomas [name] is a 53 y.o. y.o. male who was identified and appropriate procedural time out was performed. The patient was then placed supine on the table and prepped and draped in the usual sterile fashion.   Ultrasound was  placed in the sterile sleeve and the left groin was evaluated the left common femoral artery was echolucent and pulsatile indicating patency.  Image was recorded for the permanent record and under real-time visualization a microneedle was inserted into the common femoral artery microwire followed by a micro-sheath.  A J-wire was then advanced through the micro-sheath and a  5 [place] sheath was then inserted over a J-wire. J-wire was then advanced and a 5 French pigtail catheter was positioned at the level of T12. AP projection of the aorta was then obtained. Pigtail catheter was repositioned to above the bifurcation and a LAO view of the pelvis was obtained.  Subsequently a pigtail catheter with the stiff angle Glidewire was used to cross the aortic bifurcation the catheter wire were advanced down into the right distal external iliac artery. Oblique view of the femoral bifurcation was then obtained and subsequently the wire was reintroduced and the pigtail catheter negotiated into the profunda femoris representing third order catheter placement. Distal runoff was then performed.  Diagnostic interpretation: The abdominal aorta is opacified with a bolus injection of contrast.  There are minimal atherosclerotic changes noted.  Single renal arteries are noted.  They are widely patent.  Normal nephrograms.  There are no hemodynamically significant lesions noted within the abdominal aorta and the aortic bifurcation is widely patent.  Bilateral common iliac arteries are widely patent.  On the right the external iliac artery demonstrates a string sign in its proximal half.  The right internal iliac artery is occluded.  On the left the external and internal iliac arteries are patent with mild to moderate atherosclerotic changes noted.  The right common femoral and profunda femoris are widely patent.  There are minimal atherosclerotic changes.  The right SFA is occluded at  its origin there is reconstitution of the SFA at  its mid level and there is retrograde filling of the distal SFA and above-knee popliteal although on the delayed images they do appear to be patent.  The mid and distal popliteal are patent and free of hemodynamically significant stenosis.  Trifurcation is patent.  Initially there appears to be three-vessel runoff to the foot although the distalmost images are nondiagnostic.  Later in the case after successful revascularization the patient does have two-vessel runoff to the foot via the anterior tibial and posterior tibial arteries.  The posterior tibial artery is the dominant runoff to the foot.  5000 units of heparin was then given and allowed to circulate and a 6 [place] Ansell sheath was advanced up and over the bifurcation and positioned in the femoral artery  KMP  catheter and stiff angle Glidewire were then negotiated down into the distal popliteal.  Distal runoff was then completed by hand injection through the catheter. The wire was then reintroduced and first the iliac lesion was addressed.  A 5 mm x 60 mm Lutonix balloon was used to angioplasty the external iliac artery.  Next a 8 mm x 40 mm life star stent was deployed across the lesion.  This was then post dilated with a 7 mm x 60 mm Lutonix drug-eluting balloon.  Follow-up imaging demonstrated excellent result with less than 5% residual stenosis.  The dilator was then returned into the sheath and the Ansell sheath was advanced through the stent so that the tip was in the mid common femoral.  KMP catheter and advantage wire were then negotiated into the superficial femoral artery.  Initially a 4 mm x 22 cm Lutonix balloon was used to angioplasty the SFA from Hunter's canal proximally.  A second Lutonix balloon 5 mm x 100 mm was used to complete the angioplasty of the SFA through its origin.  Follow-up imaging demonstrated greater than 60% stenosis at the origin of the SFA magnified RAO images were obtained and a 6 mm x 40 mm life stent was  deployed across this lesion and postdilated with a 5 mm x 40 mm Lutonix drug-eluting  was used to post dilate the stent.    Distal runoff was then performed from the common femoral down to the ankle.  He now reveals the SFA is patent with less than 10% residual stenosis throughout its course.  At the origin where the stent is located there is less than 5% residual stenosis.  The popliteal remains widely patent as is the trifurcation and there is now distal runoff to the foot with the posterior tibial filling the plantar vessels and the pedal arch.  After review of these images the sheath is pulled into the left external iliac oblique of the common femoral is obtained and a Star close device deployed. There no immediate complications.   Findings:  The abdominal aorta is opacified with a bolus injection of contrast.  There are minimal atherosclerotic changes noted.  Single renal arteries are noted.  They are widely patent.  Normal nephrograms.  There are no hemodynamically significant lesions noted within the abdominal aorta and the aortic bifurcation is widely patent.  Bilateral common iliac arteries are widely patent.  On the right the external iliac artery demonstrates a string sign in its proximal half.  The right internal iliac artery is occluded.  On the left the external and internal iliac arteries are patent with mild to moderate atherosclerotic changes noted.  The right common femoral  and profunda femoris are widely patent.  There are minimal atherosclerotic changes.  The right SFA is occluded at its origin there is reconstitution of the SFA at its mid level and there is retrograde filling of the distal SFA and above-knee popliteal although on the delayed images they do appear to be patent.  The mid and distal popliteal are patent and free of hemodynamically significant stenosis.  Trifurcation is patent.  Initially there appears to be three-vessel runoff to the foot although the distalmost images  are nondiagnostic.  Later in the case after successful revascularization the patient does have two-vessel runoff to the foot via the anterior tibial and posterior tibial arteries.  The posterior tibial artery is the dominant runoff to the foot.  Following angioplasty and stent placement in the right external iliac artery there is less than 5% residual stenosis.  Distal runoff was then performed from the common femoral down to the ankle.  He now reveals the SFA is patent with less than 10% residual stenosis throughout its course.  At the origin where the stent is located there is less than 5% residual stenosis.  The popliteal remains widely patent as is the trifurcation and there is now distal runoff to the foot with the posterior tibial filling the plantar vessels and the pedal arch.    Summary: Successful recanalization right lower extremity for limb salvage    Disposition: Patient was taken to the recovery room in stable condition having tolerated the procedure well.  Thomas [name], Latina CraverGregory [name] 06/07/2019,10:52 PM

## 2019-06-07 NOTE — Sedation Documentation (Signed)
Intra-procedure documentation (VS) completed to best of RN's ability. Pt continually turning and twisting in bed. Pt is A&Ox4 but requires frequent reminders not to remove pulse ox, BP cuff, cardiac leads, and capnography sensor. Monitoring equipment needed to be repositioned multiple times during procedure, leading to incomplete sets of VS during procedure. Pt remained awake and alert through entire procedure.

## 2019-06-07 NOTE — ED Notes (Signed)
Report given to Alicia RN.

## 2019-06-07 NOTE — Progress Notes (Signed)
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 Haldol [Haloperidol Lactate]     Patient Measurements: Height: 6' 0.01" (182.9 cm) Weight: 168 lb 14 oz (76.6 kg) IBW/kg (Calculated) : 77.62 Heparin Dosing Weight: 76.6 kg  Vital Signs: Temp: 97.8 F (36.6 C) (09/24 2336) Temp Source: Oral (09/24 2311) BP: 104/70 (09/24 2336) Pulse Rate: 77 (09/24 2336)  Labs: Recent Labs    06/07/19 1834  HGB 15.6  HCT 46.7  PLT 131*  LABPROT 12.7  INR 1.0  CREATININE 1.58*    Estimated Creatinine Clearance: 58.6 mL/min (A) (by C-G formula based on SCr of 1.58 mg/dL (H)).   Medical History: History reviewed. No pertinent past medical history.  Medications:  Scheduled:  . atorvastatin  10 mg Oral q1800  . fentaNYL      . fentaNYL      . heparin      . [START ON 06/08/2019] heparin  2,000 Units Intravenous Once  . [START ON 06/08/2019] influenza vac split quadrivalent PF  0.5 mL Intramuscular Tomorrow-1000  . midazolam      . [START ON 06/08/2019] sodium chloride flush  3 mL Intravenous Q12H    Assessment: Patient admitted x fall w/ ankle fx s/p ortho reduction w/ ischemic foot consulted by vascular s/p catheterization. Patient not on PTA anticoagulation and is being started on heparin drip x anti-coagulation post cath for LE ischemic foot.  Goal of Therapy:  Heparin level 0.3-0.7 units/ml Monitor platelets by anticoagulation protocol: Yes   Plan:  Per Dr. Delana Meyer will give one-half of calculated bolus of heparin 2000 units IV x 1 (patient received 400 units heparin in cath suite). Will start rate at heparin 1100 units/hr (roughly 14 units/kg/hr) Baseline PT/INR, CBC WNL, aPTT pending. Will check anti-Xa at 0600 w/ CBC. Will continue monitoring CBC's and will adjust per anti-Xa levels.  Thank you for  involving pharmacy in the care of this patient, will continue to follow with you.  Tobie Lords, PharmD, BCPS Clinical Pharmacist 06/07/2019,11:49 PM

## 2019-06-07 NOTE — ED Provider Notes (Addendum)
[facility] Emergency Department Provider Note  ____________________________________________   First MD Initiated Contact with Patient 06/07/19 1823     (approximate)  I have reviewed the triage vital signs and the nursing notes.   HISTORY  Chief Complaint Fall    HPI Thomas [name] is a 53 y.o. male presents emergency department complaint of right ankle pain.  States he fell in the kitchen.  States the ankle rolled and is pointed in the wrong direction.  He arrived via EMS.  EMS states that his pulse was thready on the way in.  Patient was given 50 of fentanyl via EMS.  He is still complaining of a great deal of pain.   Patient states he last had some soup at lunch.  That was approximately 12 or 1:00.   History reviewed. No pertinent past medical history.  There are no active problems to display for this patient.     Prior to Admission medications   Not on File    Allergies Haldol [haloperidol lactate]  History reviewed. No pertinent family history.  Social History Social History   Tobacco Use  . Smoking status: Current Every Day Smoker  . Smokeless tobacco: Never Used  Substance Use Topics  . Alcohol use: Yes    Comment: occasionally  . Drug use: Not on file    Review of Systems  Constitutional: No fever/chills Eyes: No visual changes. ENT: No sore throat. Respiratory: Denies cough Genitourinary: Negative for dysuria. Musculoskeletal: Negative for back pain.  Positive for right ankle pain Skin: Negative for rash.    ____________________________________________   PHYSICAL EXAM:  VITAL SIGNS: ED Triage Vitals [06/07/19 1753]  Enc Vitals Group     BP 102/88     Pulse Rate 83     Resp 18     Temp 98.3 F (36.8 C)     Temp Source Oral     SpO2 98 %     Weight      Height      Head Circumference      Peak Flow      Pain Score 10     Pain Loc      Pain Edu?      Excl. in [place]?     Constitutional: Alert and  oriented. Well appearing and in no acute distress. Eyes: Conjunctivae are normal.  Head: Atraumatic. Nose: No congestion/rhinnorhea. Mouth/Throat: Mucous membranes are moist.   Neck:  supple no lymphadenopathy noted Cardiovascular: Normal rate, regular rhythm.  Respiratory: Normal respiratory effort.  No retractions,  GU: deferred Musculoskeletal: Positive deformity noted to the right ankle, pulses were not palpated or found with Doppler.  Foot appears to be cool there is no cap refill.   Neurologic:  Normal speech and language.  Skin:  Skin is warm, dry and intact. No rash noted. Psychiatric: Mood and affect are normal. Speech and behavior are normal.  ____________________________________________   LABS (all labs ordered are listed, but only abnormal results are displayed)  Labs Reviewed  CBC - Abnormal; Notable for the following components:      Result Value   MCV 101.5 (*)    Platelets 131 (*)    All other components within normal limits  BASIC METABOLIC PANEL - Abnormal; Notable for the following components:   Creatinine, Ser 1.58 (*)    Calcium 8.5 (*)    GFR calc non Af Amer 49 (*)    GFR calc Af Amer 57 (*)    All  other components within normal limits  SARS CORONAVIRUS 2 (HOSPITAL ORDER, PERFORMED IN [facility])  PROTIME-INR   ____________________________________________   ____________________________________________  RADIOLOGY  Right ankle  ____________________________________________   PROCEDURES  Procedure(s) performed: No  Procedures    ____________________________________________   INITIAL IMPRESSION / ASSESSMENT AND PLAN / ED COURSE  Pertinent labs & imaging results that were available during my care of the patient were reviewed by me and considered in my medical decision making (see chart for details).   Patient is 53 year old male presents emergency department complaint of right ankle pain.  See HPI  Physical exam shows  deformity with decreased sensation and no pulses in the right foot.  When the pulse was not found with Doppler patient was moved to room 2 immediately.  Dr. Fanny Bien notified immediately.  He is in see the patient.  Clinical Course as of Jun 07 2147  Thu Jun 07, 2019  1839 Patient alert, consents to sedation and reduction for R ankle injury. Emergent basis due to pulseless R foot.   [MQ]  1839 Paged ortho for stat consult (Dr. Hyacinth Meeker) at this time   [MQ]  1858 Dr. Hyacinth Meeker returned page. Stat consult placed. Will see patient shortly.    [MQ]  2015 Dr. Lorretta Harp, vascular surgery at bedside with patient now.   [MQ]    Clinical Course User Index [MQ] Sharyn Creamer, MD   Danne Vasek was evaluated in Emergency Department on 06/07/2019 for the symptoms described in the history of present illness. He was evaluated in the context of the global COVID-19 pandemic, which necessitated consideration that the patient might be at risk for infection with the SARS-CoV-2 virus that causes COVID-19. Institutional protocols and algorithms that pertain to the evaluation of patients at risk for COVID-19 are in a state of rapid change based on information released by regulatory bodies including the CDC and federal and state organizations. These policies and algorithms were followed during the patient's care in the ED.   As part of my medical decision making, I reviewed the following data within the electronic MEDICAL RECORD NUMBER Nursing notes reviewed and incorporated, Old chart reviewed, Radiograph reviewed , Evaluated by EM attending Dr. Fanny Bien, Notes from prior ED visits and [place] Controlled Substance Database  ____________________________________________   FINAL CLINICAL IMPRESSION(S) / ED DIAGNOSES  Final diagnoses:  Closed fracture of right ankle, initial encounter  Absent foot pulse      NEW MEDICATIONS STARTED DURING THIS VISIT:  There are no discharge medications for this patient.    Note:  This  document was prepared using Dragon voice recognition software and may include unintentional dictation errors.    Faythe Ghee, PA-C 06/07/19 Corinna Lines, MD 06/07/19 Aretha Parrot    Faythe Ghee, PA-C 06/07/19 2148    Sharyn Creamer, MD 06/08/19 [phone]

## 2019-06-08 ENCOUNTER — Other Ambulatory Visit: Payer: Self-pay

## 2019-06-08 ENCOUNTER — Encounter
Admission: EM | Disposition: A | Discharge status: Home Health | Payer: Self-pay | Source: Home / Self Care | Attending: Internal Medicine

## 2019-06-08 ENCOUNTER — Inpatient Hospital Stay: Payer: Medicaid Other | Admitting: Anesthesiology

## 2019-06-08 ENCOUNTER — Encounter: Payer: Self-pay | Admitting: Emergency Medicine

## 2019-06-08 ENCOUNTER — Inpatient Hospital Stay: Payer: Medicaid Other

## 2019-06-08 DIAGNOSIS — I70203 Unspecified atherosclerosis of native arteries of extremities, bilateral legs: Secondary | ICD-10-CM

## 2019-06-08 HISTORY — PX: ORIF ANKLE FRACTURE: SHX5408

## 2019-06-08 LAB — CBC
HCT: 42.6 % (ref 39.0–52.0)
Hemoglobin: 14.3 g/dL (ref 13.0–17.0)
MCH: 34 pg (ref 26.0–34.0)
MCHC: 33.6 g/dL (ref 30.0–36.0)
MCV: 101.2 fL — ABNORMAL HIGH (ref 80.0–100.0)
Platelets: 134 10*3/uL — ABNORMAL LOW (ref 150–400)
RBC: 4.21 MIL/uL — ABNORMAL LOW (ref 4.22–5.81)
RDW: 13.9 % (ref 11.5–15.5)
WBC: 6.8 10*3/uL (ref 4.0–10.5)
nRBC: 0 % (ref 0.0–0.2)

## 2019-06-08 LAB — APTT: aPTT: 35 seconds (ref 24–36)

## 2019-06-08 LAB — MRSA PCR SCREENING: MRSA by PCR: NEGATIVE

## 2019-06-08 LAB — HEPARIN LEVEL (UNFRACTIONATED): Heparin Unfractionated: 0.45 IU/mL (ref 0.30–0.70)

## 2019-06-08 SURGERY — OPEN REDUCTION INTERNAL FIXATION (ORIF) ANKLE FRACTURE
Anesthesia: General | Site: Ankle | Laterality: Right

## 2019-06-08 MED ORDER — FLEET ENEMA 7-19 GM/118ML RE ENEM: 1.0000 | ENEMA | Freq: Once | RECTAL | Status: DC | PRN

## 2019-06-08 MED ORDER — ENOXAPARIN SODIUM 30 MG/0.3ML ~~LOC~~ SOLN: 30.0000 mg | SUBCUTANEOUS | Status: DC

## 2019-06-08 MED ORDER — CLINDAMYCIN PHOSPHATE 600 MG/50ML IV SOLN
INTRAVENOUS | Status: DC | PRN
  Administered 2019-06-08: 600 mg via INTRAVENOUS

## 2019-06-08 MED ORDER — ONDANSETRON HCL 4 MG/2ML IJ SOLN: 4.0000 mg | Freq: Four times a day (QID) | INTRAMUSCULAR | Status: DC | PRN

## 2019-06-08 MED ORDER — EPHEDRINE SULFATE 50 MG/ML IJ SOLN
INTRAMUSCULAR | Status: DC | PRN
  Administered 2019-06-08: 10 mg via INTRAVENOUS
  Administered 2019-06-08: 5 mg via INTRAVENOUS

## 2019-06-08 MED ORDER — SODIUM CHLORIDE 0.9 % IV BOLUS
1000.0000 mL | Freq: Once | INTRAVENOUS | Status: AC
  Administered 2019-06-08: 08:00:00 1000 mL via INTRAVENOUS

## 2019-06-08 MED ORDER — CEFAZOLIN SODIUM 1 G IJ SOLR
INTRAMUSCULAR | Status: AC
  Filled 2019-06-08: qty 10

## 2019-06-08 MED ORDER — LORAZEPAM 1 MG PO TABS: 1.0000 mg | ORAL_TABLET | ORAL | Status: DC | PRN

## 2019-06-08 MED ORDER — OXYCODONE HCL 5 MG PO TABS: 5.0000 mg | ORAL_TABLET | Freq: Once | ORAL | Status: DC | PRN

## 2019-06-08 MED ORDER — ENOXAPARIN SODIUM 40 MG/0.4ML ~~LOC~~ SOLN: 40.0000 mg | SUBCUTANEOUS | Status: DC

## 2019-06-08 MED ORDER — CLINDAMYCIN PHOSPHATE 600 MG/50ML IV SOLN
600.0000 mg | Freq: Three times a day (TID) | INTRAVENOUS | Status: AC
  Administered 2019-06-09 (×2): 600 mg via INTRAVENOUS
  Filled 2019-06-08 (×3): qty 50

## 2019-06-08 MED ORDER — MORPHINE SULFATE (PF) 2 MG/ML IV SOLN
2.0000 mg | INTRAVENOUS | Status: DC | PRN
  Administered 2019-06-08 – 2019-06-10 (×3): 2 mg via INTRAVENOUS
  Filled 2019-06-08 (×3): qty 1

## 2019-06-08 MED ORDER — MIDAZOLAM HCL 2 MG/2ML IJ SOLN
INTRAMUSCULAR | Status: DC | PRN
  Administered 2019-06-08: 2 mg via INTRAVENOUS

## 2019-06-08 MED ORDER — PHENYLEPHRINE HCL (PRESSORS) 10 MG/ML IV SOLN
INTRAVENOUS | Status: DC | PRN
  Administered 2019-06-08 (×2): 100 ug via INTRAVENOUS
  Administered 2019-06-08 (×3): 200 ug via INTRAVENOUS
  Administered 2019-06-08 (×2): 100 ug via INTRAVENOUS
  Administered 2019-06-08: 200 ug via INTRAVENOUS
  Administered 2019-06-08: 100 ug via INTRAVENOUS
  Administered 2019-06-08: 200 ug via INTRAVENOUS

## 2019-06-08 MED ORDER — METHOCARBAMOL 1000 MG/10ML IJ SOLN
500.0000 mg | Freq: Four times a day (QID) | INTRAVENOUS | Status: DC | PRN
  Filled 2019-06-08: qty 5

## 2019-06-08 MED ORDER — PROMETHAZINE HCL 25 MG/ML IJ SOLN: 6.2500 mg | INTRAMUSCULAR | Status: DC | PRN

## 2019-06-08 MED ORDER — LORAZEPAM 2 MG PO TABS: 2.0000 mg | ORAL_TABLET | Freq: Four times a day (QID) | ORAL | Status: DC | PRN

## 2019-06-08 MED ORDER — METOCLOPRAMIDE HCL 10 MG PO TABS
5.0000 mg | ORAL_TABLET | Freq: Three times a day (TID) | ORAL | Status: DC | PRN
  Filled 2019-06-08: qty 1

## 2019-06-08 MED ORDER — PROPOFOL 10 MG/ML IV BOLUS
INTRAVENOUS | Status: DC | PRN
  Administered 2019-06-08: 200 mg via INTRAVENOUS

## 2019-06-08 MED ORDER — HYDROMORPHONE HCL 1 MG/ML IJ SOLN
INTRAMUSCULAR | Status: AC
  Administered 2019-06-08: 0.5 mg via INTRAVENOUS
  Filled 2019-06-08: qty 1

## 2019-06-08 MED ORDER — KETOROLAC TROMETHAMINE 30 MG/ML IJ SOLN
INTRAMUSCULAR | Status: AC
  Filled 2019-06-08: qty 1

## 2019-06-08 MED ORDER — BUPIVACAINE HCL 0.5 % IJ SOLN
INTRAMUSCULAR | Status: DC | PRN
  Administered 2019-06-08: 30 mL

## 2019-06-08 MED ORDER — SEVOFLURANE IN SOLN
RESPIRATORY_TRACT | Status: AC
  Filled 2019-06-08: qty 250

## 2019-06-08 MED ORDER — CEFAZOLIN SODIUM-DEXTROSE 2-4 GM/100ML-% IV SOLN
2.0000 g | Freq: Three times a day (TID) | INTRAVENOUS | Status: AC
  Administered 2019-06-08 – 2019-06-09 (×3): 2 g via INTRAVENOUS
  Filled 2019-06-08 (×3): qty 100

## 2019-06-08 MED ORDER — ADULT MULTIVITAMIN W/MINERALS CH: 1.0000 | ORAL_TABLET | Freq: Every day | ORAL | Status: DC

## 2019-06-08 MED ORDER — LORAZEPAM 2 MG PO TABS: 0.0000 mg | ORAL_TABLET | Freq: Two times a day (BID) | ORAL | Status: DC

## 2019-06-08 MED ORDER — LIDOCAINE HCL (CARDIAC) PF 100 MG/5ML IV SOSY
PREFILLED_SYRINGE | INTRAVENOUS | Status: DC | PRN
  Administered 2019-06-08: 60 mg via INTRAVENOUS

## 2019-06-08 MED ORDER — DOXEPIN HCL 100 MG PO CAPS
100.0000 mg | ORAL_CAPSULE | Freq: Every day | ORAL | Status: DC
  Filled 2019-06-08 (×2): qty 1

## 2019-06-08 MED ORDER — LACTATED RINGERS IV SOLN
INTRAVENOUS | Status: DC
  Administered 2019-06-08 (×2): via INTRAVENOUS

## 2019-06-08 MED ORDER — CLOPIDOGREL BISULFATE 75 MG PO TABS: 75.0000 mg | ORAL_TABLET | Freq: Every day | ORAL | Status: DC

## 2019-06-08 MED ORDER — SODIUM CHLORIDE 0.9 % IV SOLN: 250.0000 mL | INTRAVENOUS | Status: DC | PRN

## 2019-06-08 MED ORDER — BISACODYL 10 MG RE SUPP: 10.0000 mg | Freq: Every day | RECTAL | Status: DC | PRN

## 2019-06-08 MED ORDER — MEPERIDINE HCL 50 MG/ML IJ SOLN: 6.2500 mg | INTRAMUSCULAR | Status: DC | PRN

## 2019-06-08 MED ORDER — DIPHENHYDRAMINE HCL 12.5 MG/5ML PO ELIX
12.5000 mg | ORAL_SOLUTION | Freq: Four times a day (QID) | ORAL | Status: DC | PRN
  Filled 2019-06-08: qty 5

## 2019-06-08 MED ORDER — NEOMYCIN-POLYMYXIN B GU 40-200000 IR SOLN
Status: DC | PRN
  Administered 2019-06-08: 4 mL

## 2019-06-08 MED ORDER — OXYCODONE HCL 5 MG/5ML PO SOLN: 5.0000 mg | Freq: Once | ORAL | Status: DC | PRN

## 2019-06-08 MED ORDER — FENTANYL CITRATE (PF) 100 MCG/2ML IJ SOLN: 25.0000 ug | INTRAMUSCULAR | Status: DC | PRN

## 2019-06-08 MED ORDER — NALOXONE HCL 0.4 MG/ML IJ SOLN: 0.4000 mg | INTRAMUSCULAR | Status: DC | PRN

## 2019-06-08 MED ORDER — FENTANYL CITRATE (PF) 100 MCG/2ML IJ SOLN
INTRAMUSCULAR | Status: AC
  Filled 2019-06-08: qty 2

## 2019-06-08 MED ORDER — SODIUM CHLORIDE 0.9% FLUSH: 9.0000 mL | INTRAVENOUS | Status: DC | PRN

## 2019-06-08 MED ORDER — HYDROMORPHONE HCL 1 MG/ML IJ SOLN
0.2500 mg | INTRAMUSCULAR | Status: DC | PRN
  Administered 2019-06-08: 17:00:00 0.5 mg via INTRAVENOUS

## 2019-06-08 MED ORDER — DOCUSATE SODIUM 100 MG PO CAPS
100.0000 mg | ORAL_CAPSULE | Freq: Two times a day (BID) | ORAL | Status: DC
  Administered 2019-06-10: 100 mg via ORAL
  Filled 2019-06-08: qty 1

## 2019-06-08 MED ORDER — CLOPIDOGREL BISULFATE 75 MG PO TABS
75.0000 mg | ORAL_TABLET | Freq: Every day | ORAL | Status: DC
  Administered 2019-06-11: 75 mg via ORAL
  Filled 2019-06-08: qty 1

## 2019-06-08 MED ORDER — MAGNESIUM HYDROXIDE 400 MG/5ML PO SUSP: 30.0000 mL | Freq: Every day | ORAL | Status: DC | PRN

## 2019-06-08 MED ORDER — QUETIAPINE FUMARATE 200 MG PO TABS
400.0000 mg | ORAL_TABLET | Freq: Every day | ORAL | Status: DC
  Filled 2019-06-08 (×2): qty 2

## 2019-06-08 MED ORDER — GABAPENTIN 400 MG PO CAPS
400.0000 mg | ORAL_CAPSULE | Freq: Three times a day (TID) | ORAL | Status: DC
  Administered 2019-06-10 – 2019-06-11 (×3): 400 mg via ORAL
  Filled 2019-06-08 (×3): qty 1

## 2019-06-08 MED ORDER — BACLOFEN 10 MG PO TABS
20.0000 mg | ORAL_TABLET | Freq: Three times a day (TID) | ORAL | Status: DC
  Administered 2019-06-10 – 2019-06-11 (×3): 20 mg via ORAL
  Filled 2019-06-08 (×11): qty 2

## 2019-06-08 MED ORDER — MIDAZOLAM HCL 2 MG/2ML IJ SOLN
INTRAMUSCULAR | Status: AC
  Filled 2019-06-08: qty 2

## 2019-06-08 MED ORDER — VASOPRESSIN 20 UNIT/ML IV SOLN
INTRAVENOUS | Status: DC | PRN
  Administered 2019-06-08: 2 [IU] via INTRAVENOUS

## 2019-06-08 MED ORDER — ACETAMINOPHEN 10 MG/ML IV SOLN
INTRAVENOUS | Status: AC
  Filled 2019-06-08: qty 100

## 2019-06-08 MED ORDER — ACETAMINOPHEN 10 MG/ML IV SOLN
INTRAVENOUS | Status: DC | PRN
  Administered 2019-06-08: 1000 mg via INTRAVENOUS

## 2019-06-08 MED ORDER — NICOTINE 21 MG/24HR TD PT24
21.0000 mg | MEDICATED_PATCH | Freq: Every day | TRANSDERMAL | Status: DC
  Administered 2019-06-09: 21 mg via TRANSDERMAL
  Filled 2019-06-08 (×2): qty 1

## 2019-06-08 MED ORDER — LORAZEPAM 2 MG/ML IJ SOLN
1.0000 mg | INTRAMUSCULAR | Status: DC | PRN
  Administered 2019-06-08 – 2019-06-09 (×8): 2 mg via INTRAVENOUS
  Administered 2019-06-09: 4 mg via INTRAVENOUS
  Administered 2019-06-09: 2 mg via INTRAVENOUS
  Filled 2019-06-08 (×8): qty 1
  Filled 2019-06-08: qty 2

## 2019-06-08 MED ORDER — VITAMIN B-1 100 MG PO TABS
100.0000 mg | ORAL_TABLET | Freq: Every day | ORAL | Status: DC
  Administered 2019-06-11: 11:00:00 100 mg via ORAL
  Filled 2019-06-08: qty 1

## 2019-06-08 MED ORDER — METOCLOPRAMIDE HCL 5 MG/ML IJ SOLN: 5.0000 mg | Freq: Three times a day (TID) | INTRAMUSCULAR | Status: DC | PRN

## 2019-06-08 MED ORDER — THIAMINE HCL 100 MG/ML IJ SOLN
100.0000 mg | Freq: Every day | INTRAMUSCULAR | Status: DC
  Administered 2019-06-10: 100 mg via INTRAVENOUS
  Filled 2019-06-08: qty 2

## 2019-06-08 MED ORDER — HYDROMORPHONE HCL 1 MG/ML IJ SOLN: 1.0000 mg | Freq: Once | INTRAMUSCULAR | Status: DC

## 2019-06-08 MED ORDER — SODIUM CHLORIDE 0.45 % IV SOLN
INTRAVENOUS | Status: DC
  Administered 2019-06-08 – 2019-06-11 (×7): via INTRAVENOUS

## 2019-06-08 MED ORDER — DEXMEDETOMIDINE HCL IN NACL 200 MCG/50ML IV SOLN
INTRAVENOUS | Status: DC | PRN
  Administered 2019-06-08 (×2): 4 ug via INTRAVENOUS

## 2019-06-08 MED ORDER — OLANZAPINE 5 MG PO TABS: 5.0000 mg | ORAL_TABLET | Freq: Two times a day (BID) | ORAL | Status: DC | PRN

## 2019-06-08 MED ORDER — FOLIC ACID 1 MG PO TABS
1.0000 mg | ORAL_TABLET | Freq: Every day | ORAL | Status: DC
  Administered 2019-06-11: 1 mg via ORAL
  Filled 2019-06-08: qty 1

## 2019-06-08 MED ORDER — DIPHENHYDRAMINE HCL 50 MG/ML IJ SOLN
12.5000 mg | Freq: Four times a day (QID) | INTRAMUSCULAR | Status: DC | PRN
  Administered 2019-06-09 – 2019-06-10 (×2): 12.5 mg via INTRAVENOUS
  Filled 2019-06-08 (×2): qty 1

## 2019-06-08 MED ORDER — ONDANSETRON HCL 4 MG PO TABS: 4.0000 mg | ORAL_TABLET | Freq: Four times a day (QID) | ORAL | Status: DC | PRN

## 2019-06-08 MED ORDER — LORAZEPAM 2 MG PO TABS: 0.0000 mg | ORAL_TABLET | Freq: Four times a day (QID) | ORAL | Status: DC

## 2019-06-08 MED ORDER — PROPOFOL 10 MG/ML IV BOLUS
INTRAVENOUS | Status: AC
  Filled 2019-06-08: qty 20

## 2019-06-08 MED ORDER — METHOCARBAMOL 500 MG PO TABS
500.0000 mg | ORAL_TABLET | Freq: Four times a day (QID) | ORAL | Status: DC | PRN
  Filled 2019-06-08: qty 1

## 2019-06-08 MED ORDER — FENTANYL CITRATE (PF) 100 MCG/2ML IJ SOLN
INTRAMUSCULAR | Status: DC | PRN
  Administered 2019-06-08: 25 ug via INTRAVENOUS
  Administered 2019-06-08: 50 ug via INTRAVENOUS

## 2019-06-08 MED ORDER — CLINDAMYCIN PHOSPHATE 600 MG/50ML IV SOLN
INTRAVENOUS | Status: AC
  Filled 2019-06-08: qty 50

## 2019-06-08 SURGICAL SUPPLY — 48 items
BIT DRILL 2.5X110 QC LCP DISP (BIT) ×2 IMPLANT
BIT DRILL CANN 2.7X625 NONSTRL (BIT) ×2 IMPLANT
BLADE SURG SZ10 CARB STEEL (BLADE) ×3 IMPLANT
BNDG COHESIVE 4X5 TAN STRL (GAUZE/BANDAGES/DRESSINGS) ×3 IMPLANT
BNDG ESMARK 6X12 TAN STRL LF (GAUZE/BANDAGES/DRESSINGS) ×3 IMPLANT
CANISTER SUCT 1200ML W/VALVE (MISCELLANEOUS) ×3 IMPLANT
CHLORAPREP W/TINT 26 (MISCELLANEOUS) ×3 IMPLANT
COVER WAND RF STERILE (DRAPES) ×3 IMPLANT
CUFF TOURN SGL QUICK 18X4 (TOURNIQUET CUFF) ×2 IMPLANT
DRAPE C-ARMOR (DRAPES) ×2 IMPLANT
DRAPE FLUOR MINI C-ARM 54X84 (DRAPES) ×3 IMPLANT
ELECT REM PT RETURN 9FT ADLT (ELECTROSURGICAL) ×3
ELECTRODE REM PT RTRN 9FT ADLT (ELECTROSURGICAL) ×1 IMPLANT
GAUZE SPONGE 4X4 12PLY STRL (GAUZE/BANDAGES/DRESSINGS) ×3 IMPLANT
GAUZE XEROFORM 1X8 LF (GAUZE/BANDAGES/DRESSINGS) ×3 IMPLANT
GLOVE SURG ORTHO 8.0 STRL STRW (GLOVE) ×3 IMPLANT
GOWN STRL REUS W/ TWL LRG LVL3 (GOWN DISPOSABLE) ×1 IMPLANT
GOWN STRL REUS W/TWL LRG LVL3 (GOWN DISPOSABLE) ×2
GOWN STRL REUS W/TWL LRG LVL4 (GOWN DISPOSABLE) ×3 IMPLANT
GUIDEWIRE THREADED 150MM (WIRE) ×4 IMPLANT
HANDLE YANKAUER SUCT BULB TIP (MISCELLANEOUS) ×3 IMPLANT
K-WIRE 1.25X100 (WIRE) ×6 IMPLANT
KIT TURNOVER KIT A (KITS) ×3 IMPLANT
LABEL OR SOLS (LABEL) ×3 IMPLANT
NS IRRIG 1000ML POUR BTL (IV SOLUTION) ×3 IMPLANT
PACK EXTREMITY ARMC (MISCELLANEOUS) ×3 IMPLANT
PAD PREP 24X41 OB/GYN DISP (PERSONAL CARE ITEMS) ×3 IMPLANT
PADDING CAST BLEND 6X4 STRL (MISCELLANEOUS) IMPLANT
PADDING STRL CAST 6IN (MISCELLANEOUS) ×2
PLATE LCP 3.5 1/3 TUB 6HX69 (Plate) ×2 IMPLANT
SCREW CANC FT 4.0X20 (Screw) ×2 IMPLANT
SCREW CANC FT ST SFS 4X16 (Screw) ×2 IMPLANT
SCREW CANN L THRD/50 4.0 (Screw) ×4 IMPLANT
SCREW CORTEX 3.5 16MM (Screw) ×6 IMPLANT
SCREW CORTEX 3.5 18MM (Screw) ×2 IMPLANT
SCREW CORTEX 3.5 20MM (Screw) ×2 IMPLANT
SCREW LOCK CORT ST 3.5X16 (Screw) IMPLANT
SCREW LOCK CORT ST 3.5X18 (Screw) IMPLANT
SCREW LOCK CORT ST 3.5X20 (Screw) IMPLANT
SPLINT CAST 1 STEP 4X30 (MISCELLANEOUS) ×4 IMPLANT
SPONGE LAP 18X18 RF (DISPOSABLE) ×5 IMPLANT
STAPLER SKIN PROX 35W (STAPLE) ×3 IMPLANT
STOCKINETTE BIAS CUT 6 980064 (GAUZE/BANDAGES/DRESSINGS) ×3 IMPLANT
STOCKINETTE STRL 6IN 960660 (GAUZE/BANDAGES/DRESSINGS) ×3 IMPLANT
SUT VIC AB 2-0 CT1 27 (SUTURE) ×2
SUT VIC AB 2-0 CT1 TAPERPNT 27 (SUTURE) ×1 IMPLANT
SUT VIC AB 3-0 SH 27 (SUTURE) ×4
SUT VIC AB 3-0 SH 27X BRD (SUTURE) ×1 IMPLANT

## 2019-06-08 NOTE — Progress Notes (Signed)
Pt very agitated upon arriving back from surgery. Medication given per order with good effect.

## 2019-06-08 NOTE — Progress Notes (Signed)
Pt refused BP

## 2019-06-08 NOTE — Progress Notes (Signed)
Subjective: Day of Surgery Procedure(s) (LRB): OPEN REDUCTION INTERNAL FIXATION (ORIF) ANKLE FRACTURE (Right)   The patient is awake but extremely argumentative today.  His mother indicates that he is bipolar and schizophrenic and has been off his medications.  I have discussed treatment with Dr. Delana Meyer his vascular surgeon who feels it is safe to proceed with ORIF of the ankle today.  His heparin has been stopped 6 hours ago.  Discussed surgery with the patient and his mother.  I emphasized the risk of nonunion, infection, and even amputation due to the compromised circulation in this leg.  After long discussion the patient agrees to surgery.  We will get a psychiatric consultation after surgery and get him back on medication.  He demands excessive amounts of pain medication will try to deal with this as best we can.  Patient reports pain as severe.  Objective:   VITALS:   Vitals:   06/08/19 0804 06/08/19 0922  BP: 92/75 (!) 112/94  Pulse: 66 84  Resp: 19 19  Temp: 98.1 F (36.7 C) 98.4 F (36.9 C)  SpO2: 96% 99%    Neurologically intact ABD soft Neurovascular intact Sensation intact distally  The ankle is tender to touch.  The skin is warm.  He is able to move his toes. LABS Recent Labs    06/07/19 1834 06/08/19 0556  HGB 15.6 14.3  HCT 46.7 42.6  WBC 8.0 6.8  PLT 131* 134*    Recent Labs    06/07/19 1834  NA 136  K 4.3  BUN 18  CREATININE 1.58*  GLUCOSE 96    Recent Labs    06/07/19 1834  INR 1.0     Assessment/Plan: Day of Surgery Procedure(s) (LRB): OPEN REDUCTION INTERNAL FIXATION (ORIF) ANKLE FRACTURE (Right)  Plan for open reduction internal fixation right ankle today.  Patient and his mother know this is higher risk than normal due to his vascular problems.

## 2019-06-08 NOTE — Transfer of Care (Signed)
Immediate Anesthesia Transfer of Care Note  Patient: Thomas [name]  Procedure(s) Performed: OPEN REDUCTION INTERNAL FIXATION (ORIF) ANKLE FRACTURE (Right Ankle)  Patient Location: PACU  Anesthesia Type:General  Level of Consciousness: sedated  Airway & Oxygen Therapy: Patient Spontanous Breathing and Patient connected to face mask oxygen  Post-op Assessment: Report given to RN and Post -op Vital signs reviewed and stable  Post vital signs: Reviewed and stable  Last Vitals:  Vitals Value Taken Time  BP 91/58 06/08/19 1559  Temp 36.6 C 06/08/19 1559  Pulse 94 06/08/19 1603  Resp 14 06/08/19 1603  SpO2 100 % 06/08/19 1603  Vitals shown include unvalidated device data.  Last Pain:  Vitals:   06/08/19 1559  TempSrc:   PainSc: Asleep         Complications: No apparent anesthesia complications

## 2019-06-08 NOTE — Anesthesia Postprocedure Evaluation (Signed)
Anesthesia Post Note  Patient: Thomas [name]  Procedure(s) Performed: OPEN REDUCTION INTERNAL FIXATION (ORIF) ANKLE FRACTURE (Right Ankle)  Patient location during evaluation: PACU Anesthesia Type: General Level of consciousness: awake and alert Pain management: pain level controlled Vital Signs Assessment: post-procedure vital signs reviewed and stable Respiratory status: spontaneous breathing, nonlabored ventilation, respiratory function stable and patient connected to nasal cannula oxygen Cardiovascular status: blood pressure returned to baseline and stable Postop Assessment: no apparent nausea or vomiting Anesthetic complications: no     Last Vitals:  Vitals:   06/08/19 1735 06/08/19 1830  BP: (!) 120/95 (!) 107/94  Pulse: 90 88  Resp: 18 17  Temp: 36.8 C 36.8 C  SpO2: 100%     Last Pain:  Vitals:   06/08/19 1659  TempSrc:   PainSc: Asleep                 Precious Haws Piscitello

## 2019-06-08 NOTE — H&P (Signed)
THE PATIENT WAS SEEN PRIOR TO SURGERY TODAY.  HISTORY, ALLERGIES, HOME MEDICATIONS AND OPERATIVE PROCEDURE WERE REVIEWED. RISKS AND BENEFITS OF SURGERY DISCUSSED WITH PATIENT AGAIN.  NO CHANGES FROM INITIAL HISTORY AND PHYSICAL NOTED.  I AGAIN INFORMED THE PATIENT AND HIS MOTHER THAT THIS WAS A HIGHER THAN NORMAL RISK OPERATION DUE TO HIS VASCULAR ISSUES BUT THAT WE COULD NOT EXPECT A GOOD RESULT WITH CASTING ALONE.  THEY AGREE TO [place]

## 2019-06-08 NOTE — Progress Notes (Signed)
Pt refused that valuables be locked in safe. Pt's mother Elois witnessed refusal.

## 2019-06-08 NOTE — Progress Notes (Signed)
Patient arrived to [facility], This RN and Linsey RN went in to check patient in and he would not give [place] his name or birthday. Patient stated, you can bring me a cup of water. We explained to the patient that he can not have any water. Patient stated, "well then I am not having surgery, so now bring me my cup of water. We explained again if you want surgery we can't give you water. Patient stated well I am not having surgery.   Dr Sabra Heck came in and spoke with patient and is agreeable for surgery after speaking with Doctor. Patient prepped for surgery.

## 2019-06-08 NOTE — Anesthesia Preprocedure Evaluation (Signed)
Anesthesia Evaluation  Patient identified by MRN, date of birth, ID band Patient awake    Reviewed: Allergy & Precautions, NPO status , Patient's Chart, lab work & pertinent test results  History of Anesthesia Complications Negative for: history of anesthetic complications  Airway Mallampati: II  TM Distance: >3 FB Neck ROM: Full    Dental  (+) Partial Upper, Partial Lower   Pulmonary neg sleep apnea, neg COPD, Current Smoker and Patient abstained from smoking.,    breath sounds clear to auscultation- rhonchi (-) wheezing      Cardiovascular hypertension, + Peripheral Vascular Disease  (-) CAD, (-) Past MI, (-) Cardiac Stents and (-) CABG  Rhythm:Regular Rate:Normal - Systolic murmurs and - Diastolic murmurs    Neuro/Psych Seizures -,  PSYCHIATRIC DISORDERS (EtOH abuse) Anxiety Bipolar Disorder Hx of c-spine fracture     GI/Hepatic negative GI ROS, Neg liver ROS,   Endo/Other  negative endocrine ROSneg diabetes  Renal/GU negative Renal ROS     Musculoskeletal negative musculoskeletal ROS (+)   Abdominal (+) - obese,   Peds  Hematology  (+) anemia ,   Anesthesia Other Findings   Reproductive/Obstetrics                             Anesthesia Physical Anesthesia Plan  ASA: III  Anesthesia Plan: General   Post-op Pain Management:    Induction: Intravenous  PONV Risk Score and Plan: 0 and Ondansetron  Airway Management Planned: LMA  Additional Equipment:   Intra-op Plan:   Post-operative Plan:   Informed Consent: I have reviewed the patients History and Physical, chart, labs and discussed the procedure including the risks, benefits and alternatives for the proposed anesthesia with the patient or authorized representative who has indicated his/her understanding and acceptance.     Dental advisory given  Plan Discussed with: CRNA and Anesthesiologist  Anesthesia Plan  Comments:         Anesthesia Quick Evaluation

## 2019-06-08 NOTE — Progress Notes (Signed)
Conversation with Sabra Heck (Ortho) regarding pain medication. He wants to speak with Vascular at this time. No surgery at time d/t heparin drip

## 2019-06-08 NOTE — Progress Notes (Signed)
Pt has family at bedside overnight. visitor did not have a ride home and would have to call the elderly mother to pick up. AC notified and said visitor could stay overnight. Visitor instructed that she would not be able to stay overnight and additional nights. visitor verbalized understanding.

## 2019-06-08 NOTE — Anesthesia Post-op Follow-up Note (Signed)
Anesthesia QCDR form completed.        

## 2019-06-08 NOTE — Consult Note (Signed)
[facility] Face-to[facility]Face [facility]iatry Consult   Reason for Consult: Bipolar and schizophrenia off medications Referring Physician: Burna SisGregory Schrnier Patient Identification: Thomas [name] MRN:  [id number] Principal Diagnosis: <principal problem not specified> Diagnosis:  Active Problems:   Ischemia of extremity   Total Time spent with patient: 15 minutes  Subjective:   Thomas [name] is a 53 y.o. male patient admitted with orthopedic and vascular injury of the right ankle/foot.  Psychiatry consult was called due to patient displaying aggressive behavior to nursing staff and history of bipolar schizophrenia off medications.  HPI: Patient is a 53 year old male presented to the emergency room with orthopedic and vascular injury of the right foot.  Last night shortly after presentation patient was displaying agitated and aggressive behaviors but due to the the need to operate on his foot was brought into surgery.  Overnight patient was abusive and aggressive with the nurses.   today patient had another operation and was seen in PACU shortly following procedure.  Due to sedation writer was unable to fully evaluate patient.  This was discussed with treatment team who asked for recommendations regarding acute agitation in case patient were to become belligerent again tonight. Team is unsure of substance abuse problems given pain out of proportion to exam and therefore he was placed on CIWA protocol. Chart reviewed and case discussed with treatment team.  Pharmacy was called and medication history was reviewed.  More recently patient was prescribed Zyprexa 5 mg, doxepin unknown milligrams, and Adderall 20 mg twice daily.  Past Psychiatric History: History of schizoaffective bipolar type. per mother patient has been noncompliant with his medications.  Risk to Self:  Unknown Risk to Others:  Unknown Prior Inpatient Therapy:  Unknown Prior Outpatient Therapy:  Unknown  Past Medical History:  Past Medical  History:  Diagnosis Date  . Alcohol abuse   . Anemia   . Bipolar 1 disorder (HCC)   . Cervical spine fracture (HCC)   . Chronic, continuous use of opioids   . History of seizure   . HTN (hypertension)   . Insomnia   . MDD (major depressive disorder), recurrent, severe, with psychosis (HCC)   . Pancreatitis    History reviewed. No pertinent surgical history. Family History: History reviewed. No pertinent family history. Family Psychiatric  History: Not applicable Social History:  Social History   Substance and Sexual Activity  Alcohol Use Yes   Comment: occasionally     Social History   Substance and Sexual Activity  Drug Use Not on file    Social History   Socioeconomic History  . Marital status: Single    Spouse name: Not on file  . Number of children: Not on file  . Years of education: Not on file  . Highest education level: Not on file  Occupational History  . Not on file  Social Needs  . Financial resource strain: Not on file  . Food insecurity    Worry: Not on file    Inability: Not on file  . Transportation needs    Medical: Not on file    Non-medical: Not on file  Tobacco Use  . Smoking status: Current Every Day Smoker  . Smokeless tobacco: Never Used  Substance and Sexual Activity  . Alcohol use: Yes    Comment: occasionally  . Drug use: Not on file  . Sexual activity: Not on file  Lifestyle  . Physical activity    Days per week: Not on file    Minutes per session: Not  on file  . Stress: Not on file  Relationships  . Social Musician on phone: Not on file    Gets together: Not on file    Attends religious service: Not on file    Active member of club or organization: Not on file    Attends meetings of clubs or organizations: Not on file    Relationship status: Not on file  Other Topics Concern  . Not on file  Social History Narrative  . Not on file   Additional Social History:    Allergies:   Allergies  Allergen Reactions   . Haldol [Haloperidol Lactate]     Labs:  Results for orders placed or performed during the hospital encounter of 06/07/19 (from the past 48 hour(s))  SARS Coronavirus 2 [facility] order, Performed in [facility] hospital lab) Nasopharyngeal Nasopharyngeal Swab     Status: None   Collection Time: 06/07/19  6:34 PM   Specimen: Nasopharyngeal Swab  Result Value Ref Range   SARS Coronavirus 2 NEGATIVE NEGATIVE    Comment: (NOTE) If result is NEGATIVE SARS-CoV-2 target nucleic acids are NOT DETECTED. The SARS-CoV-2 RNA is generally detectable in upper and lower  respiratory specimens during the acute phase of infection. The lowest  concentration of SARS-CoV-2 viral copies this assay can detect is 250  copies / mL. A negative result does not preclude SARS-CoV-2 infection  and should not be used as the sole basis for treatment or other  patient management decisions.  A negative result may occur with  improper specimen collection / handling, submission of specimen other  than nasopharyngeal swab, presence of viral mutation(s) within the  areas targeted by this assay, and inadequate number of viral copies  (<250 copies / mL). A negative result must be combined with clinical  observations, patient history, and epidemiological information. If result is POSITIVE SARS-CoV-2 target nucleic acids are DETECTED. The SARS-CoV-2 RNA is generally detectable in upper and lower  respiratory specimens dur ing the acute phase of infection.  Positive  results are indicative of active infection with SARS-CoV-2.  Clinical  correlation with patient history and other diagnostic information is  necessary to determine patient infection status.  Positive results do  not rule out bacterial infection or co-infection with other viruses. If result is PRESUMPTIVE POSTIVE SARS-CoV-2 nucleic acids MAY BE PRESENT.   A presumptive positive result was obtained on the submitted specimen  and confirmed on repeat testing.   While 2019 novel coronavirus  (SARS-CoV-2) nucleic acids may be present in the submitted sample  additional confirmatory testing may be necessary for epidemiological  and / or clinical management purposes  to differentiate between  SARS-CoV-2 and other Sarbecovirus currently known to infect humans.  If clinically indicated additional testing with an alternate test  methodology [phone]) is advised. The SARS-CoV-2 RNA is generally  detectable in upper and lower respiratory sp ecimens during the acute  phase of infection. The expected result is Negative. Fact Sheet for Patients:  BoilerBrush.com.cy Fact Sheet for Healthcare Providers: https://pope.com/ This test is not yet approved or cleared by the [place] FDA and has been authorized for detection and/or diagnosis of SARS-CoV-2 by FDA under an Emergency Use Authorization (EUA).  This EUA will remain in effect (meaning this test can be used) for the duration of the COVID-19 declaration under Section 564(b)(1) of the Act, 21 U.S.C. section 360bbb-3(b)(1), unless the authorization is terminated or revoked sooner. Performed at [facility], [address]  [place], [place] [address]   CBC     Status: Abnormal   Collection Time: 06/07/19  6:34 PM  Result Value Ref Range   WBC 8.0 4.0 - 10.5 K/uL   RBC 4.60 4.22 - 5.81 MIL/uL   Hemoglobin 15.6 13.0 - 17.0 g/dL   HCT 62.1 30.8 - 65.7 %   MCV 101.5 (H) 80.0 - 100.0 fL   MCH 33.9 26.0 - 34.0 pg   MCHC 33.4 30.0 - 36.0 g/dL   RDW 84.6 96.2 - 95.2 %   Platelets 131 (L) 150 - 400 K/uL   nRBC 0.0 0.0 - 0.2 %    Comment: Performed at [facility] [address] [place], [place] [address]  Basic metabolic panel     Status: Abnormal   Collection Time: 06/07/19  6:34 PM  Result Value Ref Range   Sodium 136 135 - 145 mmol/L   Potassium 4.3 3.5 - 5.1 mmol/L   Chloride 103 98 - 111 mmol/L   CO2 25 22 - 32 mmol/L    Glucose, Bld 96 70 - 99 mg/dL   BUN 18 6 - 20 mg/dL   Creatinine, Ser 4.40 (H) 0.61 - 1.24 mg/dL   Calcium 8.5 (L) 8.9 - 10.3 mg/dL   GFR calc non Af Amer 49 (L) >60 mL/min   GFR calc Af Amer 57 (L) >60 mL/min   Anion gap 8 5 - 15    Comment: Performed at [facility], [address] [place], [place] [address]  Protime-INR     Status: None   Collection Time: 06/07/19  6:34 PM  Result Value Ref Range   Prothrombin Time 12.7 11.4 - 15.2 seconds   INR 1.0 0.8 - 1.2    Comment: (NOTE) INR goal varies based on device and disease states. Performed at [facility], [address] [place], [place] [address]   APTT     Status: None   Collection Time: 06/08/19 12:53 AM  Result Value Ref Range   aPTT 35 24 - 36 seconds    Comment: Performed at [facility], [address] [place], [place] [address]  MRSA PCR Screening     Status: None   Collection Time: 06/08/19  5:53 AM   Specimen: Nasal Mucosa; Nasopharyngeal  Result Value Ref Range   MRSA by PCR NEGATIVE NEGATIVE    Comment:        The GeneXpert MRSA Assay (FDA approved for NASAL specimens only), is one component of a comprehensive MRSA colonization surveillance program. It is not intended to diagnose MRSA infection nor to guide or monitor treatment for MRSA infections. Performed at [facility], [address] [place], [place] [address]   Heparin level (unfractionated)     Status: None   Collection Time: 06/08/19  5:56 AM  Result Value Ref Range   Heparin Unfractionated 0.45 0.30 - 0.70 IU/mL    Comment: (NOTE) If heparin results are below expected values, and patient dosage has  been confirmed, suggest follow up testing of antithrombin III levels. Performed at [facility], [address] [place], [place] [address]   CBC     Status: Abnormal   Collection Time: 06/08/19  5:56 AM  Result Value Ref Range   WBC 6.8 4.0 - 10.5 K/uL   RBC 4.21 (L) 4.22 - 5.81 MIL/uL    Hemoglobin 14.3 13.0 - 17.0 g/dL   HCT 64.3 32.9 - 51.8 %   MCV 101.2 (H) 80.0 - 100.0  fL   MCH 34.0 26.0 - 34.0 pg   MCHC 33.6 30.0 - 36.0 g/dL   RDW 13.9 11.5 - 15.5 %   Platelets 134 (L) 150 - 400 K/uL   nRBC 0.0 0.0 - 0.2 %    Comment: Performed at [facility], [address], [place], [place] [address]    Current Facility-Administered Medications  Medication Dose Route Frequency Provider Last Rate Last Dose  . 0.45 % sodium chloride infusion   Intravenous Continuous Earnestine Leys, MD      . Doug Sou Hold] 0.9 %  sodium chloride infusion  250 mL Intravenous PRN Stegmayer, Janalyn Harder, PA-C      . [MAR Hold] acetaminophen (TYLENOL) tablet 650 mg  650 mg Oral Q4H PRN Schnier, Dolores Lory, MD      . Doug Sou Hold] atorvastatin (LIPITOR) tablet 10 mg  10 mg Oral q1800 Schnier, Dolores Lory, MD      . baclofen (LIORESAL) tablet 20 mg  20 mg Oral TID Earnestine Leys, MD      . clopidogrel (PLAVIX) tablet 75 mg  75 mg Oral Daily Schnier, Dolores Lory, MD      . doxepin (SINEQUAN) capsule 100 mg  100 mg Oral QHS Earnestine Leys, MD      . enoxaparin (LOVENOX) injection 40 mg  40 mg Subcutaneous Q24H Schnier, Dolores Lory, MD      . fentaNYL (SUBLIMAZE) injection 25-50 mcg  25-50 mcg Intravenous Q5 min PRN Penwarden, Amy, MD      . Doug Sou Hold] folic acid (FOLVITE) tablet 1 mg  1 mg Oral Daily Stegmayer, Kimberly A, PA-C      . HYDROmorphone (DILAUDID) injection 0.25-0.5 mg  0.25-0.5 mg Intravenous Q5 min PRN Penwarden, Amy, MD   0.5 mg at 06/08/19 1653  . [MAR Hold] HYDROmorphone (DILAUDID) injection 0.5-1 mg  0.5-1 mg Intravenous Q2H PRN Schnier, Dolores Lory, MD   1 mg at 06/08/19 0557  . [MAR Hold] influenza vac split quadrivalent PF (FLUARIX) injection 0.5 mL  0.5 mL Intramuscular Tomorrow-1000 Schnier, Dolores Lory, MD      . lactated ringers infusion   Intravenous Continuous Penwarden, Amy, MD 100 mL/hr at 06/08/19 1405    . [MAR Hold] LORazepam (ATIVAN) tablet 1-4 mg  1-4 mg Oral Q1H PRN Stegmayer,  Kimberly A, PA-C       Or  . [MAR Hold] LORazepam (ATIVAN) injection 1-4 mg  1-4 mg Intravenous Q1H PRN Stegmayer, Kimberly A, PA-C   2 mg at 06/08/19 0826  . [MAR Hold] LORazepam (ATIVAN) tablet 0-4 mg  0-4 mg Oral Q6H Stegmayer, Kimberly A, PA-C       Followed by  . [MAR Hold] LORazepam (ATIVAN) tablet 0-4 mg  0-4 mg Oral Q12H Stegmayer, Kimberly A, PA-C      . meperidine (DEMEROL) injection 6.25-12.5 mg  6.25-12.5 mg Intravenous Q5 min PRN Penwarden, Amy, MD      . Doug Sou Hold] morphine 2 MG/ML injection 2 mg  2 mg Intravenous Q1H PRN Earnestine Leys, MD   2 mg at 06/08/19 1246  . [MAR Hold] multivitamin with minerals tablet 1 tablet  1 tablet Oral Daily Stegmayer, Kimberly A, PA-C      . [MAR Hold] nicotine (NICODERM CQ - dosed in mg/24 hours) patch 21 mg  21 mg Transdermal Daily Stegmayer, Kimberly A, PA-C      . [MAR Hold] ondansetron (ZOFRAN) injection 4 mg  4 mg Intravenous Q6H PRN Schnier, Dolores Lory, MD      .  oxyCODONE (Oxy IR/ROXICODONE) immediate release tablet 5 mg  5 mg Oral Once PRN Penwarden, Amy, MD       Or  . oxyCODONE (ROXICODONE) 5 MG/5ML solution 5 mg  5 mg Oral Once PRN Penwarden, Amy, MD      . Mitzi Hansen Hold] oxyCODONE (Oxy IR/ROXICODONE) immediate release tablet 5-10 mg  5-10 mg Oral Q4H PRN Schnier, Latina Craver, MD   10 mg at 06/08/19 0347  . promethazine (PHENERGAN) injection 6.25-12.5 mg  6.25-12.5 mg Intravenous Q15 min PRN Penwarden, Amy, MD      . QUEtiapine (SEROQUEL) tablet 400 mg  400 mg Oral QHS Deeann Saint, MD      . Mitzi Hansen Hold] sodium chloride flush (NS) 0.9 % injection 3 mL  3 mL Intravenous Q12H Schnier, Latina Craver, MD      . Mitzi Hansen Hold] sodium chloride flush (NS) 0.9 % injection 3 mL  3 mL Intravenous PRN Schnier, Latina Craver, MD      . Mitzi Hansen Hold] thiamine (VITAMIN B-1) tablet 100 mg  100 mg Oral Daily Stegmayer, Kimberly A, PA-C       Or  . Mitzi Hansen Hold] thiamine (B-1) injection 100 mg  100 mg Intravenous Daily Stegmayer, Ranae Plumber, PA-C       Mental status  exam: Patient was lying in bed sedated, air tube present.  Visible cast on right foot.  Patient appears older than stated age.  The remainder of the mental status exam was unable to be performed due to patient's condition.   DX: Schizoaffective disorder  Treatment Plan Summary: Daily contact with patient to assess and evaluate symptoms and progress in treatment  Medication Management:   Agitation: Continue CIWA protocol Lorazepam 2 mg p.o. as needed agitation Zyprexa 5 mg p.o. as needed for severe agitation  Psych Medications: Do not administer Adderall as could contribute to agitation Do not administer doxepin as could contribute to agitation  Disposition: Unclear at this time what level of care patient will need, psychiatry will continue to reassess.   Clement Sayres, MD 06/08/2019 5:04 PM

## 2019-06-08 NOTE — Anesthesia Procedure Notes (Signed)
Procedure Name: LMA Insertion Performed by: Allean Found, CRNA Pre-anesthesia Checklist: Patient identified, Patient being monitored, Timeout performed, Emergency Drugs available and Suction available Patient Re-evaluated:Patient Re-evaluated prior to induction Oxygen Delivery Method: Circle system utilized Preoxygenation: Pre-oxygenation with 100% oxygen Induction Type: IV induction Ventilation: Mask ventilation without difficulty LMA: LMA inserted Tube type: Oral Number of attempts: 1 Placement Confirmation: positive ETCO2 and breath sounds checked- equal and bilateral Tube secured with: Tape Dental Injury: Teeth and Oropharynx as per pre-operative assessment

## 2019-06-08 NOTE — Progress Notes (Signed)
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 Haldol [Haloperidol Lactate]     Patient Measurements: Height: 6' 0.01" (182.9 cm) Weight: 168 lb 14 oz (76.6 kg) IBW/kg (Calculated) : 77.62 Heparin Dosing Weight: 76.6 kg  Vital Signs: Temp: 98.2 F (36.8 C) (09/25 0553) Temp Source: Oral (09/25 0553) BP: 113/78 (09/25 0553) Pulse Rate: 80 (09/25 0553)  Labs: Recent Labs    06/07/19 1834 06/08/19 0053 06/08/19 0556  HGB 15.6  --  14.3  HCT 46.7  --  42.6  PLT 131*  --  134*  APTT  --  35  --   LABPROT 12.7  --   --   INR 1.0  --   --   HEPARINUNFRC  --   --  0.45  CREATININE 1.58*  --   --     Estimated Creatinine Clearance: 58.6 mL/min (A) (by C-G formula based on SCr of 1.58 mg/dL (H)).   Medical History: History reviewed. No pertinent past medical history.  Medications:  Scheduled:  . atorvastatin  10 mg Oral q1800  . fentaNYL      . fentaNYL      . heparin      . influenza vac split quadrivalent PF  0.5 mL Intramuscular Tomorrow-1000  . midazolam      . sodium chloride flush  3 mL Intravenous Q12H    Assessment: Patient admitted x fall w/ ankle fx s/p ortho reduction w/ ischemic foot consulted by vascular s/p catheterization. Patient not on PTA anticoagulation and is being started on heparin drip x anti-coagulation post cath for LE ischemic foot.  Goal of Therapy:  Heparin level 0.3-0.7 units/ml Monitor platelets by anticoagulation protocol: Yes   Plan:  09/25 @ 0600 HL 0.45 therapeutic. Will continue current rate and will recheck HL @ 1200, CBC stable will continue to monitor.  Thank you for involving pharmacy in the care of this patient, will continue to follow with you.  Tobie Lords, PharmD, BCPS Clinical Pharmacist 06/08/2019,7:32 AM

## 2019-06-08 NOTE — Progress Notes (Signed)
Conversation with Vascular PA concerning pain medication. Pt is berating nightshift and oncoming shift regarding pain. Pt threatening to call 911. Nightshift had given all medications that were available.

## 2019-06-08 NOTE — Progress Notes (Signed)
[facility] Daily Progress Note   Subjective: 1 Day Post-Op: 1. Introduction catheter into right lower extremity 3rd order catheter placement  2. Contrast injection right lower extremity for distal runoff   3. Percutaneous transluminal angioplasty and stent placement right superficial femoral artery  4. Percutaneous transluminal angioplasty and stent placement right external iliac artery             5.  Star close closure left common femoral arteriotomy  Patient complaining of severe pain to the right lower extremity. On phone with mother threatening to leave the hospital because he is not "getting enough pain medication".   Objective: Vitals:   06/08/19 0259 06/08/19 0553 06/08/19 0804 06/08/19 0922  BP: 118/84 113/78 92/75 (!) 112/94  Pulse: 82 80 66 84  Resp: 16 18 19 19   Temp: 97.7 F (36.5 C) 98.2 F (36.8 C) 98.1 F (36.7 C) 98.4 F (36.9 C)  TempSrc: Oral Oral Oral   SpO2: 95% 94% 96% 99%  Weight:      Height:        Intake/Output Summary (Last 24 hours) at 06/08/2019 1222 Last data filed at 06/08/2019 1124 Gross per 24 hour  Intake 554.8 ml  Output 1000 ml  Net -445.2 ml   Physical Exam: A&Ox3, NAD CV: RRR Pulmonary: CTA Bilaterally Abdomen: Soft, Nontender, Nondistended Vascular:  Right Lower Extremity: Thigh soft. Calf soft. Extremity is warm distally to toes. Good toe capillary refill. Motor / sensory intact. No signs of compartment syndrome   Laboratory: CBC    Component Value Date/Time   WBC 6.8 06/08/2019 0556   HGB 14.3 06/08/2019 0556   HCT 42.6 06/08/2019 0556   PLT 134 (L) 06/08/2019 0556   BMET    Component Value Date/Time   NA 136 06/07/2019 1834   K 4.3 06/07/2019 1834   CL 103 06/07/2019 1834   CO2 25 06/07/2019 1834   GLUCOSE 96 06/07/2019 1834   BUN 18 06/07/2019 1834   CREATININE 1.58 (H) 06/07/2019 1834   CALCIUM 8.5 (L) 06/07/2019 1834   GFRNONAA 49 (L)  06/07/2019 1834   GFRAA 57 (L) 06/07/2019 1834   Assessment/Planning: The patient is a 53 year old male who presented with right ankle fracture and right lower extremity ischemia 1) Improvement in physical exam this AM when compared to presentation 2) Unable to add toradol to pain regimen due to patients kidney function, morphine added by Mr. Sabra Heck - patient on oxycodone, morphine dilaudid 3) Started CIWA protocol in response to patients agressive behavior toward staff, pain out of proportion to exam and suspicion of possible ETOH abuse 4) OR today with Dr. Sabra Heck  Discussed with Dr. Eber Hong Adaleah Forget PA-C 06/08/2019 12:22 PM

## 2019-06-08 NOTE — Op Note (Signed)
  06/08/2019  3:54 PM  PATIENT:  Thomas [name]  53 y.o. male  PRE-OPERATIVE DIAGNOSIS:  Right Ankle Fracture, displaced trimalleolar  POST-OPERATIVE DIAGNOSIS:  Same  PROCEDURE:  OPEN REDUCTION INTERNAL FIXATION (ORIF) ANKLE FRACTURE MEDIALLY AND LATERALLY  SURGEON:  Arshi Duarte E Calianna Kim MD  ASST:    ANESTHESIA:   General  EBL: 30 cc  TOURNIQUET TIME: Tourniquet was not used  OPERATIVE FINDINGS: The patient had a short oblique displaced fracture of the lateral malleolus and a displaced medial malleolar fracture.  There was a small fragment off the posterior malleolus.  Articular surfaces were intact.  The ankle was very stable after fixation medially and laterally.  OPERATIVE PROCEDURE:   The patient was brought to the operating room and placed in the supine position. All bony prominences were padded. General anesthesia was administered. The lower extremity was prepped and draped in the usual sterile fashion.  Due to the vascular compromise in this leg I elected not to use a tourniquet.  He had excellent bleeding during the procedure and the foot remained warm. Time out was performed.   Incision was made over the distal fibula and the fracture was exposed and reduced anatomically with a clamp. A lag screw was placed. I then applied a small fragment locking plate and secured it proximally with cortical screws and distally with cortical and locking screws.  I used the Fluoroscan to confirm satisfactory reduction and fixation. This wound was irrigated and closed with vicryl sutures and staples.  I then turned my attention to the medial malleolus. Incision was made over the medial malleolus and the fracture exposed and held provisionally with a clamp. 2 guidepins were placed for the 4.0 mm cannulated screws and then confirmation of reduction was made with fluoroscopy. I then placed 2  55mm screws which had satisfactory fixation. Fluoroscopy showed good reduction and hardware placement.   The  posterior malleolus fracture fragment was small enough that it did not need fixation.  The syndesmosis was stressed using live fluoroscopy and found to be stable.   The medial and anterior wound was irrigated, and closed with vicryl and staples. Sponge and needle counts were correct. The wounds were injected with local anesthetic. Sterile gauze was applied followed by a posterior splint. She was awakened and returned to the PACU in stable and satisfactory condition. There were no complications.  Park Breed, MD

## 2019-06-09 DIAGNOSIS — F250 Schizoaffective disorder, bipolar type: Secondary | ICD-10-CM | POA: Diagnosis present

## 2019-06-09 LAB — BASIC METABOLIC PANEL
Anion gap: 10 (ref 5–15)
BUN: 8 mg/dL (ref 6–20)
CO2: 25 mmol/L (ref 22–32)
Calcium: 8.2 mg/dL — ABNORMAL LOW (ref 8.9–10.3)
Chloride: 105 mmol/L (ref 98–111)
Creatinine, Ser: 0.65 mg/dL (ref 0.61–1.24)
GFR calc Af Amer: >60 mL/min (ref >60)
GFR calc non Af Amer: >60 mL/min (ref >60)
Glucose, Bld: 99 mg/dL (ref 70–99)
Potassium: 4.1 mmol/L (ref 3.5–5.1)
Sodium: 140 mmol/L (ref 135–145)

## 2019-06-09 LAB — CBC
HCT: 40.6 % (ref 39.0–52.0)
Hemoglobin: 13.5 g/dL (ref 13.0–17.0)
MCH: 33.7 pg (ref 26.0–34.0)
MCHC: 33.3 g/dL (ref 30.0–36.0)
MCV: 101.2 fL — ABNORMAL HIGH (ref 80.0–100.0)
Platelets: 126 10*3/uL — ABNORMAL LOW (ref 150–400)
RBC: 4.01 MIL/uL — ABNORMAL LOW (ref 4.22–5.81)
RDW: 13.9 % (ref 11.5–15.5)
WBC: 6.4 10*3/uL (ref 4.0–10.5)
nRBC: 0 % (ref 0.0–0.2)

## 2019-06-09 LAB — GLUCOSE, CAPILLARY: Glucose-Capillary: 97 mg/dL (ref 70–99)

## 2019-06-09 LAB — MAGNESIUM: Magnesium: 2.1 mg/dL (ref 1.7–2.4)

## 2019-06-09 MED ORDER — CHLORHEXIDINE GLUCONATE CLOTH 2 % EX PADS
6.0000 | MEDICATED_PAD | Freq: Every day | CUTANEOUS | Status: DC
  Administered 2019-06-09 – 2019-06-25 (×13): 6 via TOPICAL

## 2019-06-09 MED ORDER — LORAZEPAM 2 MG/ML IJ SOLN
1.0000 mg | INTRAMUSCULAR | Status: DC | PRN
  Administered 2019-06-10 (×4): 2 mg via INTRAVENOUS
  Filled 2019-06-09 (×5): qty 1

## 2019-06-09 MED ORDER — DEXMEDETOMIDINE HCL IN NACL 400 MCG/100ML IV SOLN
0.2000 ug/kg/h | INTRAVENOUS | Status: DC
  Administered 2019-06-10: 0.7 ug/kg/h via INTRAVENOUS
  Filled 2019-06-09: qty 100

## 2019-06-09 MED ORDER — OLANZAPINE 10 MG PO TABS
10.0000 mg | ORAL_TABLET | Freq: Three times a day (TID) | ORAL | Status: DC
  Administered 2019-06-10 (×2): 10 mg via ORAL
  Filled 2019-06-09 (×7): qty 1

## 2019-06-09 MED ORDER — ENOXAPARIN SODIUM 40 MG/0.4ML ~~LOC~~ SOLN
40.0000 mg | SUBCUTANEOUS | Status: DC
  Administered 2019-06-11: 40 mg via SUBCUTANEOUS
  Filled 2019-06-09 (×2): qty 0.4

## 2019-06-09 MED ORDER — DEXMEDETOMIDINE HCL IN NACL 200 MCG/50ML IV SOLN
0.4000 ug/kg/h | INTRAVENOUS | Status: DC
  Administered 2019-06-09: 0.4 ug/kg/h via INTRAVENOUS
  Filled 2019-06-09: qty 50

## 2019-06-09 MED ORDER — THIAMINE HCL 100 MG/ML IJ SOLN
Freq: Once | INTRAVENOUS | Status: AC
  Administered 2019-06-09: 21:00:00 via INTRAVENOUS
  Filled 2019-06-09: qty 1000

## 2019-06-09 MED ORDER — ROSUVASTATIN CALCIUM 10 MG PO TABS
10.0000 mg | ORAL_TABLET | Freq: Every day | ORAL | Status: DC
  Administered 2019-06-10: 17:00:00 10 mg via ORAL
  Filled 2019-06-09: qty 1

## 2019-06-09 MED ORDER — LORAZEPAM 2 MG/ML IJ SOLN
INTRAMUSCULAR | Status: AC
  Administered 2019-06-09: 2 mg via INTRAVENOUS
  Filled 2019-06-09: qty 1

## 2019-06-09 MED ORDER — DIPHENHYDRAMINE HCL 50 MG/ML IJ SOLN
INTRAMUSCULAR | Status: AC
  Filled 2019-06-09: qty 1

## 2019-06-09 MED ORDER — OLANZAPINE 10 MG IM SOLR
10.0000 mg | Freq: Three times a day (TID) | INTRAMUSCULAR | Status: DC
  Administered 2019-06-09 – 2019-06-10 (×3): 10 mg via INTRAMUSCULAR
  Filled 2019-06-09 (×10): qty 10

## 2019-06-09 MED ORDER — LORAZEPAM 1 MG PO TABS
1.0000 mg | ORAL_TABLET | Freq: Four times a day (QID) | ORAL | Status: DC | PRN
  Filled 2019-06-09: qty 1

## 2019-06-09 MED ORDER — DIAZEPAM 5 MG/ML IJ SOLN
5.0000 mg | Freq: Once | INTRAMUSCULAR | Status: AC
  Administered 2019-06-10: 5 mg via INTRAVENOUS
  Filled 2019-06-09 (×2): qty 2

## 2019-06-09 MED ORDER — DEXMEDETOMIDINE HCL IN NACL 400 MCG/100ML IV SOLN
0.4000 ug/kg/h | INTRAVENOUS | Status: DC
  Administered 2019-06-09: 0.4 ug/kg/h via INTRAVENOUS
  Administered 2019-06-09 – 2019-06-10 (×2): 1.2 ug/kg/h via INTRAVENOUS
  Filled 2019-06-09 (×4): qty 100

## 2019-06-09 MED ORDER — DIPHENHYDRAMINE HCL 50 MG/ML IJ SOLN
25.0000 mg | Freq: Once | INTRAMUSCULAR | Status: AC
  Administered 2019-06-10: 25 mg via INTRAVENOUS
  Filled 2019-06-09 (×2): qty 1

## 2019-06-09 NOTE — Progress Notes (Signed)
Patient very fidgety and restless. IV ativan given about every few hours. Patient tries to get up without assist. Patient educated on call bell and safety, patient had no response. Patient has flat affect. Bed low, alarm on, call bell in reach. Will reassess.

## 2019-06-09 NOTE — Progress Notes (Signed)
    Subjective  - POD #2  Requiring increasing ativan for agitation Not interacting appropriately with staff   Physical Exam:  Right foot in cast Does not appear to be having any pain Toes are pink with good cap refil.  Can not asses pedal pulses due to overlying cast       Assessment/Plan:  POD #2, s/p right EIA and SFA stent  ASA/Plavix for stents Will discuss with psych regarding transfer to ICU for closer monitoring.  They are adjusting agitation meds PT consult Will start statin, crestor 10  Thomas [name] 06/09/2019 2:43 PM --  Vitals:   06/09/19 1137 06/09/19 1142  BP: (!) 164/101 (!) 166/87  Pulse: (!) 105 99  Resp:    Temp: 98.4 F (36.9 C)   SpO2: 94%     Intake/Output Summary (Last 24 hours) at 06/09/2019 1443 Last data filed at 06/09/2019 1200 Gross per 24 hour  Intake 2946.62 ml  Output 1600 ml  Net 1346.62 ml     Laboratory CBC    Component Value Date/Time   WBC 6.4 06/09/2019 0411   HGB 13.5 06/09/2019 0411   HCT 40.6 06/09/2019 0411   PLT 126 (L) 06/09/2019 0411    BMET    Component Value Date/Time   NA 140 06/09/2019 0411   K 4.1 06/09/2019 0411   CL 105 06/09/2019 0411   CO2 25 06/09/2019 0411   GLUCOSE 99 06/09/2019 0411   BUN 8 06/09/2019 0411   CREATININE 0.65 06/09/2019 0411   CALCIUM 8.2 (L) 06/09/2019 0411   GFRNONAA >60 06/09/2019 0411   GFRAA >60 06/09/2019 0411    COAG Lab Results  Component Value Date   INR 1.0 06/07/2019   No results found for: PTT  Antibiotics Anti-infectives (From admission, onward)   Start     Dose/Rate Route Frequency Ordered Stop   06/08/19 1800  ceFAZolin (ANCEF) IVPB 2g/100 mL premix     2 g 200 mL/hr over 30 Minutes Intravenous Every 8 hours 06/08/19 1730 06/09/19 2159   06/08/19 1800  clindamycin (CLEOCIN) IVPB 600 mg     600 mg 100 mL/hr over 30 Minutes Intravenous Every 8 hours 06/08/19 1730 06/09/19 2159   06/08/19 0000  ceFAZolin (ANCEF) IVPB 1 g/50 mL premix  Status:   Discontinued    Note to Pharmacy: Send with pt to OR   1 g 100 mL/hr over 30 Minutes Intravenous On call 06/07/19 2030 06/07/19 2251       V. Leia Alf, M.D., [facility] [place] Office: [phone] Pager:  [phone]

## 2019-06-09 NOTE — Consult Note (Signed)
CRITICAL CARE NOTE      CHIEF COMPLAINT:   Altered mental status with agitation possible drug/alcohol withdrawal   History of present illness   This is a 53 year old male with history of alcohol abuse bipolar disorder schizophrenia and seizure disorder, major depressive disorder and recurrent psychotic episodes, history of pancreatitis, narcotic abuse, chronic anemia, came in with complaints of right ankle pain found to have a fracture.  He was also found to have critical limb ischemia of right lower extremity.  Vascular consultation was obtained and patient had 2 stents placed.  He subsequently had ORIF of the ankle, he was subsequently again by vascular with evaluation postop day #2 and was noted to be more agitated requiring increasing Ativan for agitation and not appropriately behaving with staff.  Patient was subsequently evaluated by psychiatry with modification of his medications including Zyprexa 10 3 times daily and Seroquel discontinued however patient continues to exhibit combative behavior and agitation with suspicion of drug and alcohol withdrawal.  He was brought to medical intensive care unit with consultation placed by vascular surgeon for upgrade in care and medical management.  PAST MEDICAL HISTORY   Past Medical History:  Diagnosis Date  . Alcohol abuse   . Anemia   . Bipolar 1 disorder ([place])   . Cervical spine fracture ([place])   . Chronic, continuous use of opioids   . History of seizure   . HTN (hypertension)   . Insomnia   . MDD (major depressive disorder), recurrent, severe, with psychosis ([place])   . Pancreatitis      SURGICAL HISTORY   History reviewed. No pertinent surgical history.   FAMILY HISTORY   History reviewed. No pertinent family history.   SOCIAL HISTORY   Social  History   Tobacco Use  . Smoking status: Current Every Day Smoker  . Smokeless tobacco: Never Used  Substance Use Topics  . Alcohol use: Yes    Comment: occasionally  . Drug use: Not on file     MEDICATIONS   Current Medication:  Current Facility-Administered Medications:  .  0.45 % sodium chloride infusion, , Intravenous, Continuous, Earnestine Leys, MD, Last Rate: 75 mL/hr at 06/09/19 1611 .  acetaminophen (TYLENOL) tablet 650 mg, 650 mg, Oral, Q4H PRN, Earnestine Leys, MD .  baclofen (LIORESAL) tablet 20 mg, 20 mg, Oral, TID, Earnestine Leys, MD .  bisacodyl (DULCOLAX) suppository 10 mg, 10 mg, Rectal, Daily PRN, Earnestine Leys, MD .  Chlorhexidine Gluconate Cloth 2 % PADS 6 each, 6 each, Topical, Daily, Ottie Glazier, MD, 6 each at 06/09/19 1608 .  clindamycin (CLEOCIN) IVPB 600 mg, 600 mg, Intravenous, Q8H, Earnestine Leys, MD, Last Rate: 100 mL/hr at 06/09/19 0539, 600 mg at 06/09/19 0539 .  clopidogrel (PLAVIX) tablet 75 mg, 75 mg, Oral, Daily, Earnestine Leys, MD .  dexmedetomidine (PRECEDEX) 400 MCG/100ML (4 mcg/mL) infusion, 0.4-1.2 mcg/kg/hr, Intravenous, Titrated, Charlett Nose, RPH, Last Rate: 7.66 mL/hr at 06/09/19 1617, 0.4 mcg/kg/hr at 06/09/19 1617 .  diphenhydrAMINE (BENADRYL) injection 12.5 mg, 12.5 mg, Intravenous, Q6H PRN, 12.5 mg at 06/09/19 1148 **OR** diphenhydrAMINE (BENADRYL) 12.5 MG/5ML elixir 12.5 mg, 12.5 mg, Oral, Q6H PRN, Earnestine Leys, MD .  docusate sodium (COLACE) capsule 100 mg, 100 mg, Oral, BID, Earnestine Leys, MD .  enoxaparin (LOVENOX) injection 40 mg, 40 mg, Subcutaneous, Q24H, Schnier, Dolores Lory, MD .  folic acid (FOLVITE) tablet 1 mg, 1 mg, Oral, Daily, Earnestine Leys, MD .  gabapentin (NEURONTIN) capsule 400 mg, 400 mg, Oral, TID, Sabra Heck,  Dimas Aguas, MD .  HYDROmorphone (DILAUDID) injection 0.5-1 mg, 0.5-1 mg, Intravenous, Q2H PRN, Deeann Saint, MD, 1 mg at 06/08/19 0557 .  influenza vac split quadrivalent PF (FLUARIX) injection 0.5 mL, 0.5  mL, Intramuscular, Tomorrow-1000, Deeann Saint, MD .  LORazepam (ATIVAN) tablet 1 mg, 1 mg, Oral, Q6H PRN, Shaune Pollack, Herminio Heads, NP .  magnesium hydroxide (MILK OF MAGNESIA) suspension 30 mL, 30 mL, Oral, Daily PRN, Deeann Saint, MD .  methocarbamol (ROBAXIN) tablet 500 mg, 500 mg, Oral, Q6H PRN **OR** methocarbamol (ROBAXIN) 500 mg in dextrose 5 % 50 mL IVPB, 500 mg, Intravenous, Q6H PRN, Deeann Saint, MD .  metoCLOPramide (REGLAN) tablet 5-10 mg, 5-10 mg, Oral, Q8H PRN **OR** metoCLOPramide (REGLAN) injection 5-10 mg, 5-10 mg, Intravenous, Q8H PRN, Deeann Saint, MD .  morphine 2 MG/ML injection 2 mg, 2 mg, Intravenous, Q1H PRN, Deeann Saint, MD, 2 mg at 06/08/19 1246 .  multivitamin with minerals tablet 1 tablet, 1 tablet, Oral, Daily, Deeann Saint, MD .  naloxone [facility]) injection 0.4 mg, 0.4 mg, Intravenous, PRN **AND** sodium chloride flush (NS) 0.9 % injection 9 mL, 9 mL, Intravenous, PRN, Deeann Saint, MD .  nicotine (NICODERM CQ - dosed in mg/24 hours) patch 21 mg, 21 mg, Transdermal, Daily, Deeann Saint, MD, 21 mg at 06/09/19 1000 .  OLANZapine (ZYPREXA) tablet 10 mg, 10 mg, Oral, TID **OR** OLANZapine (ZYPREXA) injection 10 mg, 10 mg, Intramuscular, TID, Lord, Herminio Heads, NP .  ondansetron [facility]) injection 4 mg, 4 mg, Intravenous, Q6H PRN, Deeann Saint, MD .  ondansetron [facility]) tablet 4 mg, 4 mg, Oral, Q6H PRN **OR** ondansetron (ZOFRAN) injection 4 mg, 4 mg, Intravenous, Q6H PRN, Deeann Saint, MD .  ondansetron [facility]) injection 4 mg, 4 mg, Intravenous, Q6H PRN, Deeann Saint, MD .  oxyCODONE (Oxy IR/ROXICODONE) immediate release tablet 5-10 mg, 5-10 mg, Oral, Q4H PRN, Deeann Saint, MD, 10 mg at 06/08/19 0347 .  rosuvastatin (CRESTOR) tablet 10 mg, 10 mg, Oral, q1800, Nada Libman, MD .  sodium chloride flush (NS) 0.9 % injection 3 mL, 3 mL, Intravenous, Q12H, Deeann Saint, MD, Stopped at 06/08/19 2200 .  sodium chloride flush (NS) 0.9 % injection 3 mL, 3 mL,  Intravenous, PRN, Deeann Saint, MD .  sodium phosphate (FLEET) 7-19 GM/118ML enema 1 enema, 1 enema, Rectal, Once PRN, Deeann Saint, MD .  thiamine (VITAMIN B-1) tablet 100 mg, 100 mg, Oral, Daily **OR** thiamine (B-1) injection 100 mg, 100 mg, Intravenous, Daily, Deeann Saint, MD    ALLERGIES   Haldol [haloperidol lactate]    REVIEW OF SYSTEMS     10 point ROS obtained and is negative except as per HPI  PHYSICAL EXAMINATION   Vitals:   06/09/19 1611 06/09/19 1615  BP: (!) 166/109 (!) 148/105  Pulse: 96 95  Resp: 19 17  Temp:  98.4 F (36.9 C)  SpO2: 100% 96%    GENERAL: Patient agitated and exhibiting disruptive behavior HEAD: Normocephalic, atraumatic.  EYES: Pupils equal, round, reactive to light.  No scleral icterus.  MOUTH: Moist mucosal membrane. NECK: Supple. No thyromegaly. No nodules. No JVD.  PULMONARY: Clear to auscultation CARDIOVASCULAR: S1 and S2. Regular rate and rhythm. No murmurs, rubs, or gallops.  GASTROINTESTINAL: Soft, nontender, non-distended. No masses. Positive bowel sounds. No hepatosplenomegaly.  MUSCULOSKELETAL: No swelling, clubbing, or edema.  NEUROLOGIC: Mild distress due to acute illness SKIN:intact,warm,dry   PERTINENT DATA      Lines / Drains: Peripheral IV   Antibiotics: None   Protocols / Consultants: Critical  care     Infusions: . sodium chloride 75 mL/hr at 06/09/19 1611  . clindamycin (CLEOCIN) IV 600 mg (06/09/19 0539)  . dexmedetomidine (PRECEDEX) IV infusion 0.4 mcg/kg/hr (06/09/19 1617)  . methocarbamol (ROBAXIN) IV     Scheduled Medications: . baclofen  20 mg Oral TID  . Chlorhexidine Gluconate Cloth  6 each Topical Daily  . clopidogrel  75 mg Oral Daily  . docusate sodium  100 mg Oral BID  . enoxaparin (LOVENOX) injection  40 mg Subcutaneous Q24H  . folic acid  1 mg Oral Daily  . gabapentin  400 mg Oral TID  . influenza vac split quadrivalent PF  0.5 mL Intramuscular Tomorrow-1000  .  multivitamin with minerals  1 tablet Oral Daily  . nicotine  21 mg Transdermal Daily  . OLANZapine  10 mg Oral TID   Or  . OLANZapine  10 mg Intramuscular TID  . rosuvastatin  10 mg Oral q1800  . sodium chloride flush  3 mL Intravenous Q12H  . thiamine  100 mg Oral Daily   Or  . thiamine  100 mg Intravenous Daily   PRN Medications: acetaminophen, bisacodyl, diphenhydrAMINE **OR** diphenhydrAMINE, HYDROmorphone (DILAUDID) injection, LORazepam, magnesium hydroxide, methocarbamol **OR** methocarbamol (ROBAXIN) IV, metoCLOPramide **OR** metoCLOPramide (REGLAN) injection, morphine injection, naloxone **AND** sodium chloride flush, ondansetron (ZOFRAN) IV, ondansetron **OR** ondansetron (ZOFRAN) IV, ondansetron (ZOFRAN) IV, oxyCODONE, sodium chloride flush, sodium phosphate Hemodynamic parameters:   Intake/Output: 09/25 0701 - 09/26 0700 In: 3126.6 [I.V.:2707.7; IV Piggyback:419] Out: 1500 [Urine:1500]  Ventilator  Settings:     LAB RESULTS:  Basic Metabolic Panel: Recent Labs  Lab 06/07/19 1834 06/09/19 0411  NA 136 140  K 4.3 4.1  CL 103 105  CO2 25 25  GLUCOSE 96 99  BUN 18 8  CREATININE 1.58* 0.65  CALCIUM 8.5* 8.2*  MG  --  2.1   Liver Function Tests: No results for input(s): AST, ALT, ALKPHOS, BILITOT, PROT, ALBUMIN in the last 168 hours. No results for input(s): LIPASE, AMYLASE in the last 168 hours. No results for input(s): AMMONIA in the last 168 hours. CBC: Recent Labs  Lab 06/07/19 1834 06/08/19 0556 06/09/19 0411  WBC 8.0 6.8 6.4  HGB 15.6 14.3 13.5  HCT 46.7 42.6 40.6  MCV 101.5* 101.2* 101.2*  PLT 131* 134* 126*   Cardiac Enzymes: No results for input(s): CKTOTAL, CKMB, CKMBINDEX, TROPONINI in the last 168 hours. BNP: Invalid input(s): POCBNP CBG: Recent Labs  Lab 06/09/19 1612  GLUCAP 97     IMAGING RESULTS:  Imaging: Dg Ankle 2 Views Right  Result Date: 06/07/2019 CLINICAL DATA:  Medial and lateral malleolar fracture subluxation  status post reduction. EXAM: RIGHT ANKLE - 2 VIEW COMPARISON:  Right ankle x-rays from same day. FINDINGS: Improved alignment of the medial malleolar and distal fibular fractures status post reduction. Residual 2 mm posterior displacement of the distal fibular fracture with 1.5 cm overriding. Slight residual widening of the medial clear space. Joint spaces are preserved. Bone mineralization is normal. Unchanged diffuse soft tissue swelling. IMPRESSION: Improved alignment status post reduction of the medial and lateral malleolar fractures. Electronically Signed   By: Obie Dredge M.D.   On: 06/07/2019 20:18   Dg Ankle Complete Right  Result Date: 06/08/2019 CLINICAL DATA:  Status post surgery to right ankle. EXAM: RIGHT ANKLE - COMPLETE 3+ VIEW COMPARISON:  None. FINDINGS: A plate has been placed across the distal fibular fracture. Two screws been placed through the medial malleolus. Skin staples are identified. IMPRESSION:  Repair of right ankle fractures as above. Electronically Signed   By: Gerome Samavid  Williams III M.D   On: 06/08/2019 17:37   Dg Ankle Complete Right  Result Date: 06/07/2019 CLINICAL DATA:  Per EMS report, patient slipped and fell in his kitchen and was witnessed by his wife. Patient has deformity to right ankle. Patient was given 50mcg of Fentanyl en route by EMT. Patient denies relief of the pain. Patient arrives with right lower leg splinted. EXAM: RIGHT ANKLE - COMPLETE 3+ VIEW COMPARISON:  None. FINDINGS: There is a transverse fracture across the base of the medial malleolus and an oblique fracture of the distal fibula extending from the metadiaphysis to the distal metaphysis at the level of the ankle joint. The talus is subluxed posteriorly and laterally by approximately 5 mm. The distal fibular fracture is displaced posteriorly by 1.3 cm. There is surrounding soft tissue swelling. IMPRESSION: 1. Displaced fractures of the medial malleolus and distal fibula with subluxation of the talus  as detailed. No dislocation. Electronically Signed   By: Amie Portlandavid  Ormond M.D.   On: 06/07/2019 18:41      ASSESSMENT AND PLAN    -Multidisciplinary rounds held today  Acute drug/alcohol withdrawal syndrome -Follow [place] alcohol withdrawal protocol -CIWA -Folate/thiamine repletion/banana bag -Refractory to multiple psychotropic meds including Ativan/doxepin/Lyrica/Sonata/Zyprexa/Seroquel, will place on Precedex drip for now -Close ICU monitoring  Critical limb ischemia of the right lower extremity -Status post vascular procedure with stent placementrightsuperficial femoral artery and stent placementrightsuperficial femoral artery  -Vascular surgery on case-follow recommendations -oxygen as needed ICU monitoring  Displaced trimalleolar or right ankle fracture    -Status post open reduction internal fixation medially and laterally   Extensive psychiatric history   -Psychiatry on case-appreciate collaboration   Essential hypertension -Restart home regimen -Close monitoring PRN IV as needed   GI/Nutrition GI PROPHYLAXIS as indicated DIET-->TF's as tolerated Constipation protocol as indicated  ENDO - ICU hypoglycemic\Hyperglycemia protocol -check FSBS per protocol   ELECTROLYTES -follow labs as needed -replace as needed -pharmacy consultation   DVT/GI PRX ordered -SCDs  TRANSFUSIONS AS NEEDED MONITOR FSBS ASSESS the need for LABS as needed   Critical care provider statement:    Critical care time (minutes):  109   Critical care time was exclusive of:  Separately billable procedures and treating other patients   Critical care was necessary to treat or prevent imminent or life-threatening deterioration of the following conditions:   Acute drug/alcohol withdrawal syndrome, critical limb ischemia of the right lower extremity, right malleolar fracture, essential hypertension, extensive psychiatric history   Critical care was time spent personally by me on the  following activities:  Development of treatment plan with patient or surrogate, discussions with consultants, evaluation of patient's response to treatment, examination of patient, obtaining history from patient or surrogate, ordering and performing treatments and interventions, ordering and review of laboratory studies and re-evaluation of patient's condition.  I assumed direction of critical care for this patient from another provider in my specialty: no    This document was prepared using Dragon voice recognition software and may include unintentional dictation errors.    Vida RiggerFuad Farid Grigorian, M.D.  Division of Pulmonary & Critical Care Medicine  [facility]

## 2019-06-09 NOTE — Progress Notes (Signed)
PT Cancellation Note  Patient Details Name: Thomas [name] MRN: [id number] DOB: Apr 05, 1966   Cancelled Treatment:    Reason Eval/Treat Not Completed: Patient not medically ready.  PT consult received.  Chart reviewed.  Pt transferred to CCU this afternoon.  Per discussion with pt's nurse, pt is currently on precedex drip and not appropriate for PT evaluation.   Also d/t pt transferring to higher level of care, per PT protocol require new PT consult in order to continue therapy (will discontinue current PT order d/t this).  Please re-consult PT when pt is medically appropriate to participate in PT (discussed with pt's nurse).  Leitha Bleak, PT 06/09/19, 5:02 PM [phone]

## 2019-06-09 NOTE — Progress Notes (Signed)
PHARMACY NOTE:  ANTICOAGULATION RENAL DOSAGE ADJUSTMENT  Current anticoagulation regimen includes a mismatch between anticoagulation dosage and estimated renal function.  As per policy approved by the Pharmacy & Therapeutics and Medical Executive Committees, the anticoagulation dosage will be adjusted accordingly.  Current anticoagulation dosage:  Enoxaparin 30 mg subq daily  Indication: VTE prophylaxis  Renal Function:  Estimated Creatinine Clearance: 115.7 mL/min (by C-G formula based on SCr of 0.65 mg/dL). []      On intermittent HD, scheduled: []      On CRRT    Anticoagulation dosage has been changed to:  Enoxaparin 40 mg subq daily  Additional comments: initially patient had a CrCl < 30 ml/min -- since CrCl has risen to > 30 ml/min that warrants dose adjustment of VTE prophylaxis.  Thank you for allowing pharmacy to be a part of this patient's care.  Tobie Lords, PharmD, BCPS Clinical Pharmacist 06/09/2019 6:36 AM

## 2019-06-09 NOTE — Plan of Care (Signed)

## 2019-06-09 NOTE — Progress Notes (Signed)
Subjective:  Patient reports pain as mild.  Was agitated earlier today.  Objective:   VITALS:   Vitals:   06/08/19 2223 06/09/19 0022 06/09/19 1137 06/09/19 1142  BP:  (!) 134/92 (!) 164/101 (!) 166/87  Pulse: 97 97 (!) 105 99  Resp: 15 16    Temp: 98.3 F (36.8 C) 98.3 F (36.8 C) 98.4 F (36.9 C)   TempSrc: Oral Oral Oral   SpO2: 93% 95% 94%   Weight:      Height:        PHYSICAL EXAM:  Compartment soft toes pink with good cap refill  LABS  Results for orders placed or performed during the hospital encounter of 06/07/19 (from the past 24 hour(s))  CBC     Status: Abnormal   Collection Time: 06/09/19  4:11 AM  Result Value Ref Range   WBC 6.4 4.0 - 10.5 K/uL   RBC 4.01 (L) 4.22 - 5.81 MIL/uL   Hemoglobin 13.5 13.0 - 17.0 g/dL   HCT 40.6 39.0 - 52.0 %   MCV 101.2 (H) 80.0 - 100.0 fL   MCH 33.7 26.0 - 34.0 pg   MCHC 33.3 30.0 - 36.0 g/dL   RDW 13.9 11.5 - 15.5 %   Platelets 126 (L) 150 - 400 K/uL   nRBC 0.0 0.0 - 0.2 %  Basic metabolic panel     Status: Abnormal   Collection Time: 06/09/19  4:11 AM  Result Value Ref Range   Sodium 140 135 - 145 mmol/L   Potassium 4.1 3.5 - 5.1 mmol/L   Chloride 105 98 - 111 mmol/L   CO2 25 22 - 32 mmol/L   Glucose, Bld 99 70 - 99 mg/dL   BUN 8 6 - 20 mg/dL   Creatinine, Ser 0.65 0.61 - 1.24 mg/dL   Calcium 8.2 (L) 8.9 - 10.3 mg/dL   GFR calc non Af Amer >60 >60 mL/min   GFR calc Af Amer >60 >60 mL/min   Anion gap 10 5 - 15  Magnesium     Status: None   Collection Time: 06/09/19  4:11 AM  Result Value Ref Range   Magnesium 2.1 1.7 - 2.4 mg/dL    Dg Ankle 2 Views Right  Result Date: 06/07/2019 CLINICAL DATA:  Medial and lateral malleolar fracture subluxation status post reduction. EXAM: RIGHT ANKLE - 2 VIEW COMPARISON:  Right ankle x-rays from same day. FINDINGS: Improved alignment of the medial malleolar and distal fibular fractures status post reduction. Residual 2 mm posterior displacement of the distal fibular  fracture with 1.5 cm overriding. Slight residual widening of the medial clear space. Joint spaces are preserved. Bone mineralization is normal. Unchanged diffuse soft tissue swelling. IMPRESSION: Improved alignment status post reduction of the medial and lateral malleolar fractures. Electronically Signed   By: Titus Dubin M.D.   On: 06/07/2019 20:18   Dg Ankle Complete Right  Result Date: 06/08/2019 CLINICAL DATA:  Status post surgery to right ankle. EXAM: RIGHT ANKLE - COMPLETE 3+ VIEW COMPARISON:  None. FINDINGS: A plate has been placed across the distal fibular fracture. Two screws been placed through the medial malleolus. Skin staples are identified. IMPRESSION: Repair of right ankle fractures as above. Electronically Signed   By: Dorise Bullion III M.D   On: 06/08/2019 17:37   Dg Ankle Complete Right  Result Date: 06/07/2019 CLINICAL DATA:  Per EMS report, patient slipped and fell in his kitchen and was witnessed by his wife. Patient has deformity to right ankle.  Patient was given of Fentanyl en route by EMT. Patient denies relief of the pain. Patient arrives with right lower leg splinted. EXAM: RIGHT ANKLE - COMPLETE 3+ VIEW COMPARISON:  None. FINDINGS: There is a transverse fracture across the base of the medial malleolus and an oblique fracture of the distal fibula extending from the metadiaphysis to the distal metaphysis at the level of the ankle joint. The talus is subluxed posteriorly and laterally by approximately 5 mm. The distal fibular fracture is displaced posteriorly by 1.3 cm. There is surrounding soft tissue swelling. IMPRESSION: 1. Displaced fractures of the medial malleolus and distal fibula with subluxation of the talus as detailed. No dislocation. Electronically Signed   By: Amie Portland M.D.   On: 06/07/2019 18:41    Assessment/Plan: 1 Day Post-Op   Principal Problem:   Ischemia of extremity Active Problems:   Schizoaffective disorder, bipolar type (HCC)   Up  with therapy when able Non-weight bearing on right leg Follow closely    Lyndle Herrlich , MD 06/09/2019, 2:55 PM

## 2019-06-09 NOTE — Progress Notes (Signed)
Patient's mother and his friend/wife [name] called for collateral information.  No recent alcohol use and no history of benzodiazepine use or abuse.  He takes Seroquel sporadically at home.  He does take Sonata, Lyrica, and Doxepin at bedtime.  Right now he is on pain medications and will not restart this along with Doxepin could increase his agitation.  Ativan alcohol detox protocol discontinued based on collateral information and Zyprexa 10 mg TID started, Seroquel discontinued.    [name], PMHNP

## 2019-06-09 NOTE — Progress Notes (Signed)
Patient being transferred to ICU per MD order.  Report called

## 2019-06-09 NOTE — Consult Note (Addendum)
[facility] Face-to-Face Psychiatry Consult   Reason for Consult:  Medications for schizoaffective disorder Referring Physician:  Dr Hyacinth Meeker Patient Identification: Thomas [name] MRN:  [id number] Principal Diagnosis: Ischemia of extremity Diagnosis:  Principal Problem:   Ischemia of extremity Active Problems:   Schizoaffective disorder, bipolar type (HCC)  Total Time spent with patient: 30 minutes  Subjective:   Thomas [name] is a 53 y.o. male patient admitted with orthopedic and vascular injury of the right ankle/foot.  Psychiatry consult was called due to patient displaying aggressive behavior to nursing staff and history of bipolar schizophrenia off medications.  Patient seen and evaluated in person by this provider.  Patient irritable on assessment, appears to be responding to internal stimuli at times as he stares at this provider and then off to the side.  Minimal verbal interaction on assessment.  Medication recommended placed yesterday by Dr Brien Few.  Calm and no agitation notes placed at this time, agitation medications are in place.  Please avoid giving Ativan and Zyprexa together due to potential of dropping the pulse and BP too quickly.  HPI per psychiatrist: Patient is a 53 year old male presented to the emergency room with orthopedic and vascular injury of the right foot.  Last night shortly after presentation patient was displaying agitated and aggressive behaviors but due to the the need to operate on his foot was brought into surgery.  Overnight patient was abusive and aggressive with the nurses.   today patient had another operation and was seen in PACU shortly following procedure.  Due to sedation writer was unable to fully evaluate patient.  This was discussed with treatment team who asked for recommendations regarding acute agitation in case patient were to become belligerent again tonight. Team is unsure of substance abuse problems given pain out of proportion to exam and  therefore he was placed on CIWA protocol. Chart reviewed and case discussed with treatment team.  Pharmacy was called and medication history was reviewed.  More recently patient was prescribed Zyprexa 5 mg, doxepin unknown milligrams, and Adderall 20 mg twice daily.  Past Psychiatric History: History of schizoaffective bipolar type. per mother patient has been noncompliant with his medications.  Risk to Self:  none Risk to Others:  none Prior Inpatient Therapy:  yes Prior Outpatient Therapy:  yes  Past Medical History:  Past Medical History:  Diagnosis Date  . Alcohol abuse   . Anemia   . Bipolar 1 disorder (HCC)   . Cervical spine fracture (HCC)   . Chronic, continuous use of opioids   . History of seizure   . HTN (hypertension)   . Insomnia   . MDD (major depressive disorder), recurrent, severe, with psychosis (HCC)   . Pancreatitis    History reviewed. No pertinent surgical history. Family History: History reviewed. No pertinent family history. Family Psychiatric  History: none Social History:  Social History   Substance and Sexual Activity  Alcohol Use Yes   Comment: occasionally     Social History   Substance and Sexual Activity  Drug Use Not on file    Social History   Socioeconomic History  . Marital status: Single    Spouse name: Not on file  . Number of children: Not on file  . Years of education: Not on file  . Highest education level: Not on file  Occupational History  . Not on file  Social Needs  . Financial resource strain: Not on file  . Food insecurity    Worry: Not on file  Inability: Not on file  . Transportation needs    Medical: Not on file    Non-medical: Not on file  Tobacco Use  . Smoking status: Current Every Day Smoker  . Smokeless tobacco: Never Used  Substance and Sexual Activity  . Alcohol use: Yes    Comment: occasionally  . Drug use: Not on file  . Sexual activity: Not on file  Lifestyle  . Physical activity    Days per  week: Not on file    Minutes per session: Not on file  . Stress: Not on file  Relationships  . Social Musician on phone: Not on file    Gets together: Not on file    Attends religious service: Not on file    Active member of club or organization: Not on file    Attends meetings of clubs or organizations: Not on file    Relationship status: Not on file  Other Topics Concern  . Not on file  Social History Narrative  . Not on file   Additional Social History:  none    Allergies:   Allergies  Allergen Reactions  . Haldol [Haloperidol Lactate]     Labs:  Results for orders placed or performed during the hospital encounter of 06/07/19 (from the past 48 hour(s))  SARS Coronavirus 2 [facility] order, Performed in [facility] hospital lab) Nasopharyngeal Nasopharyngeal Swab     Status: None   Collection Time: 06/07/19  6:34 PM   Specimen: Nasopharyngeal Swab  Result Value Ref Range   SARS Coronavirus 2 NEGATIVE NEGATIVE    Comment: (NOTE) If result is NEGATIVE SARS-CoV-2 target nucleic acids are NOT DETECTED. The SARS-CoV-2 RNA is generally detectable in upper and lower  respiratory specimens during the acute phase of infection. The lowest  concentration of SARS-CoV-2 viral copies this assay can detect is 250  copies / mL. A negative result does not preclude SARS-CoV-2 infection  and should not be used as the sole basis for treatment or other  patient management decisions.  A negative result may occur with  improper specimen collection / handling, submission of specimen other  than nasopharyngeal swab, presence of viral mutation(s) within the  areas targeted by this assay, and inadequate number of viral copies  (<250 copies / mL). A negative result must be combined with clinical  observations, patient history, and epidemiological information. If result is POSITIVE SARS-CoV-2 target nucleic acids are DETECTED. The SARS-CoV-2 RNA is generally detectable in upper and  lower  respiratory specimens dur ing the acute phase of infection.  Positive  results are indicative of active infection with SARS-CoV-2.  Clinical  correlation with patient history and other diagnostic information is  necessary to determine patient infection status.  Positive results do  not rule out bacterial infection or co-infection with other viruses. If result is PRESUMPTIVE POSTIVE SARS-CoV-2 nucleic acids MAY BE PRESENT.   A presumptive positive result was obtained on the submitted specimen  and confirmed on repeat testing.  While 2019 novel coronavirus  (SARS-CoV-2) nucleic acids may be present in the submitted sample  additional confirmatory testing may be necessary for epidemiological  and / or clinical management purposes  to differentiate between  SARS-CoV-2 and other Sarbecovirus currently known to infect humans.  If clinically indicated additional testing with an alternate test  methodology [phone]) is advised. The SARS-CoV-2 RNA is generally  detectable in upper and lower respiratory sp ecimens during the acute  phase of infection. The expected result  is Negative. Fact Sheet for Patients:  BoilerBrush.com.cy Fact Sheet for Healthcare Providers: https://pope.com/ This test is not yet approved or cleared by the [place] FDA and has been authorized for detection and/or diagnosis of SARS-CoV-2 by FDA under an Emergency Use Authorization (EUA).  This EUA will remain in effect (meaning this test can be used) for the duration of the COVID-19 declaration under Section 564(b)(1) of the Act, 21 U.S.C. section 360bbb-3(b)(1), unless the authorization is terminated or revoked sooner. Performed at [facility], [address] [place], [place] [address]   CBC     Status: Abnormal   Collection Time: 06/07/19  6:34 PM  Result Value Ref Range   WBC 8.0 4.0 - 10.5 K/uL   RBC 4.60 4.22 - 5.81 MIL/uL   Hemoglobin 15.6  13.0 - 17.0 g/dL   HCT 67.6 19.5 - 09.3 %   MCV 101.5 (H) 80.0 - 100.0 fL   MCH 33.9 26.0 - 34.0 pg   MCHC 33.4 30.0 - 36.0 g/dL   RDW 26.7 12.4 - 58.0 %   Platelets 131 (L) 150 - 400 K/uL   nRBC 0.0 0.0 - 0.2 %    Comment: Performed at [facility], [address] [place], [place] [address]  Basic metabolic panel     Status: Abnormal   Collection Time: 06/07/19  6:34 PM  Result Value Ref Range   Sodium 136 135 - 145 mmol/L   Potassium 4.3 3.5 - 5.1 mmol/L   Chloride 103 98 - 111 mmol/L   CO2 25 22 - 32 mmol/L   Glucose, Bld 96 70 - 99 mg/dL   BUN 18 6 - 20 mg/dL   Creatinine, Ser 8.25 (H) 0.61 - 1.24 mg/dL   Calcium 8.5 (L) 8.9 - 10.3 mg/dL   GFR calc non Af Amer 49 (L) >60 mL/min   GFR calc Af Amer 57 (L) >60 mL/min   Anion gap 8 5 - 15    Comment: Performed at [facility], [address] [place], [place] [address]  Protime-INR     Status: None   Collection Time: 06/07/19  6:34 PM  Result Value Ref Range   Prothrombin Time 12.7 11.4 - 15.2 seconds   INR 1.0 0.8 - 1.2    Comment: (NOTE) INR goal varies based on device and disease states. Performed at [facility], [address] [place], [place] [address]   APTT     Status: None   Collection Time: 06/08/19 12:53 AM  Result Value Ref Range   aPTT 35 24 - 36 seconds    Comment: Performed at [facility], [address] [place], [place] [address]  MRSA PCR Screening     Status: None   Collection Time: 06/08/19  5:53 AM   Specimen: Nasal Mucosa; Nasopharyngeal  Result Value Ref Range   MRSA by PCR NEGATIVE NEGATIVE    Comment:        The GeneXpert MRSA Assay (FDA approved for NASAL specimens only), is one component of a comprehensive MRSA colonization surveillance program. It is not intended to diagnose MRSA infection nor to guide or monitor treatment for MRSA infections. Performed at [facility], [address] [place], [place] [address]   Heparin level  (unfractionated)     Status: None   Collection Time: 06/08/19  5:56 AM  Result Value Ref Range   Heparin Unfractionated 0.45 0.30 - 0.70 IU/mL    Comment: (NOTE) If heparin results are below  expected values, and patient dosage has  been confirmed, suggest follow up testing of antithrombin III levels. Performed at [facility], [address] [place], [place] [address]   CBC     Status: Abnormal   Collection Time: 06/08/19  5:56 AM  Result Value Ref Range   WBC 6.8 4.0 - 10.5 K/uL   RBC 4.21 (L) 4.22 - 5.81 MIL/uL   Hemoglobin 14.3 13.0 - 17.0 g/dL   HCT 09.8 11.9 - 14.7 %   MCV 101.2 (H) 80.0 - 100.0 fL   MCH 34.0 26.0 - 34.0 pg   MCHC 33.6 30.0 - 36.0 g/dL   RDW 82.9 56.2 - 13.0 %   Platelets 134 (L) 150 - 400 K/uL   nRBC 0.0 0.0 - 0.2 %    Comment: Performed at [facility], [address] [place], [place] [address]  CBC     Status: Abnormal   Collection Time: 06/09/19  4:11 AM  Result Value Ref Range   WBC 6.4 4.0 - 10.5 K/uL   RBC 4.01 (L) 4.22 - 5.81 MIL/uL   Hemoglobin 13.5 13.0 - 17.0 g/dL   HCT 46.9 62.9 - 52.8 %   MCV 101.2 (H) 80.0 - 100.0 fL   MCH 33.7 26.0 - 34.0 pg   MCHC 33.3 30.0 - 36.0 g/dL   RDW 41.3 24.4 - 01.0 %   Platelets 126 (L) 150 - 400 K/uL    Comment: Immature Platelet Fraction may be clinically indicated, consider ordering this additional test [id number]    nRBC 0.0 0.0 - 0.2 %    Comment: Performed at [facility], [address] [place], [place] [address]  Basic metabolic panel     Status: Abnormal   Collection Time: 06/09/19  4:11 AM  Result Value Ref Range   Sodium 140 135 - 145 mmol/L   Potassium 4.1 3.5 - 5.1 mmol/L   Chloride 105 98 - 111 mmol/L   CO2 25 22 - 32 mmol/L   Glucose, Bld 99 70 - 99 mg/dL   BUN 8 6 - 20 mg/dL   Creatinine, Ser 4.25 0.61 - 1.24 mg/dL   Calcium 8.2 (L) 8.9 - 10.3 mg/dL   GFR calc non Af Amer >60 >60 mL/min   GFR calc Af Amer >60 >60 mL/min   Anion gap 10 5 - 15     Comment: Performed at [facility], [address], [place], [place] [address]  Magnesium     Status: None   Collection Time: 06/09/19  4:11 AM  Result Value Ref Range   Magnesium 2.1 1.7 - 2.4 mg/dL    Comment: Performed at [facility], [address] [place], [place] [address]    Current Facility-Administered Medications  Medication Dose Route Frequency Provider Last Rate Last Dose  . 0.45 % sodium chloride infusion   Intravenous Continuous Deeann Saint, MD 75 mL/hr at 06/09/19 [phone]    . acetaminophen (TYLENOL) tablet 650 mg  650 mg Oral Q4H PRN Deeann Saint, MD      . atorvastatin (LIPITOR) tablet 10 mg  10 mg Oral q1800 Deeann Saint, MD      . baclofen (LIORESAL) tablet 20 mg  20 mg Oral TID Deeann Saint, MD      . bisacodyl (DULCOLAX) suppository 10 mg  10 mg Rectal Daily PRN Deeann Saint, MD      . ceFAZolin (ANCEF) IVPB 2g/100 mL premix  2 g Intravenous Q8H Deeann Saint, MD 200 mL/hr  at 06/09/19 0508 2 g at 06/09/19 0508  . clindamycin (CLEOCIN) IVPB 600 mg  600 mg Intravenous Epimenio FootQ8H Miller, Howard, MD 100 mL/hr at 06/09/19 0539 600 mg at 06/09/19 0539  . clopidogrel (PLAVIX) tablet 75 mg  75 mg Oral Daily Deeann SaintMiller, Howard, MD      . diphenhydrAMINE (BENADRYL) injection 12.5 mg  12.5 mg Intravenous Q6H PRN Deeann SaintMiller, Howard, MD   12.5 mg at 06/09/19 1148   Or  . diphenhydrAMINE (BENADRYL) 12.5 MG/5ML elixir 12.5 mg  12.5 mg Oral Q6H PRN Deeann SaintMiller, Howard, MD      . docusate sodium (COLACE) capsule 100 mg  100 mg Oral BID Deeann SaintMiller, Howard, MD      . doxepin (SINEQUAN) capsule 100 mg  100 mg Oral QHS Deeann SaintMiller, Howard, MD      . enoxaparin (LOVENOX) injection 40 mg  40 mg Subcutaneous Q24H Schnier, Latina CraverGregory G, MD      . folic acid (FOLVITE) tablet 1 mg  1 mg Oral Daily Deeann SaintMiller, Howard, MD      . gabapentin (NEURONTIN) capsule 400 mg  400 mg Oral TID Deeann SaintMiller, Howard, MD      . HYDROmorphone (DILAUDID) injection 0.5-1 mg  0.5-1 mg Intravenous Q2H PRN Deeann SaintMiller, Howard, MD   1  mg at 06/08/19 0557  . influenza vac split quadrivalent PF (FLUARIX) injection 0.5 mL  0.5 mL Intramuscular Tomorrow-1000 Deeann SaintMiller, Howard, MD      . LORazepam (ATIVAN) tablet 1-4 mg  1-4 mg Oral Q1H PRN Deeann SaintMiller, Howard, MD       Or  . LORazepam (ATIVAN) injection 1-4 mg  1-4 mg Intravenous Q1H PRN Deeann SaintMiller, Howard, MD   4 mg at 06/09/19 1307  . LORazepam (ATIVAN) tablet 0-4 mg  0-4 mg Oral Q6H Deeann SaintMiller, Howard, MD       Followed by  . [START ON 06/10/2019] LORazepam (ATIVAN) tablet 0-4 mg  0-4 mg Oral Q12H Deeann SaintMiller, Howard, MD      . LORazepam (ATIVAN) tablet 2 mg  2 mg Oral Q6H PRN Cristofano, Worthy RancherPaul A, MD      . magnesium hydroxide (MILK OF MAGNESIA) suspension 30 mL  30 mL Oral Daily PRN Deeann SaintMiller, Howard, MD      . methocarbamol (ROBAXIN) tablet 500 mg  500 mg Oral Q6H PRN Deeann SaintMiller, Howard, MD       Or  . methocarbamol (ROBAXIN) 500 mg in dextrose 5 % 50 mL IVPB  500 mg Intravenous Q6H PRN Deeann SaintMiller, Howard, MD      . metoCLOPramide (REGLAN) tablet 5-10 mg  5-10 mg Oral Q8H PRN Deeann SaintMiller, Howard, MD       Or  . metoCLOPramide (REGLAN) injection 5-10 mg  5-10 mg Intravenous Q8H PRN Deeann SaintMiller, Howard, MD      . morphine 2 MG/ML injection 2 mg  2 mg Intravenous Q1H PRN Deeann SaintMiller, Howard, MD   2 mg at 06/08/19 1246  . multivitamin with minerals tablet 1 tablet  1 tablet Oral Daily Deeann SaintMiller, Howard, MD      . naloxone [facility](NARCAN) injection 0.4 mg  0.4 mg Intravenous PRN Deeann SaintMiller, Howard, MD       And  . sodium chloride flush (NS) 0.9 % injection 9 mL  9 mL Intravenous PRN Deeann SaintMiller, Howard, MD      . nicotine (NICODERM CQ - dosed in mg/24 hours) patch 21 mg  21 mg Transdermal Daily Deeann SaintMiller, Howard, MD   21 mg at 06/09/19 1000  . OLANZapine (ZYPREXA) tablet 5 mg  5 mg Oral  Q12H PRN Cristofano, Dorene Ar, MD      . ondansetron (ZOFRAN) injection 4 mg  4 mg Intravenous Q6H PRN Earnestine Leys, MD      . ondansetron [facility]) tablet 4 mg  4 mg Oral Q6H PRN Earnestine Leys, MD       Or  . ondansetron [facility]) injection 4 mg  4 mg  Intravenous Q6H PRN Earnestine Leys, MD      . ondansetron [facility]) injection 4 mg  4 mg Intravenous Q6H PRN Earnestine Leys, MD      . oxyCODONE (Oxy IR/ROXICODONE) immediate release tablet 5-10 mg  5-10 mg Oral Q4H PRN Earnestine Leys, MD   10 mg at 06/08/19 0347  . QUEtiapine (SEROQUEL) tablet 400 mg  400 mg Oral QHS Earnestine Leys, MD      . sodium chloride flush (NS) 0.9 % injection 3 mL  3 mL Intravenous Q12H Earnestine Leys, MD   Stopped at 06/08/19 2200  . sodium chloride flush (NS) 0.9 % injection 3 mL  3 mL Intravenous PRN Earnestine Leys, MD      . sodium phosphate (FLEET) 7-19 GM/118ML enema 1 enema  1 enema Rectal Once PRN Earnestine Leys, MD      . thiamine (VITAMIN B-1) tablet 100 mg  100 mg Oral Daily Earnestine Leys, MD       Or  . thiamine (B-1) injection 100 mg  100 mg Intravenous Daily Earnestine Leys, MD        Musculoskeletal: Strength & Muscle Tone: decreased Gait & Station: did not witness Patient leans: N/A  Psychiatric Specialty Exam: Physical Exam  Nursing note and vitals reviewed. Constitutional: He is oriented to person, place, and time. He appears well-developed and well-nourished.  HENT:  Head: Normocephalic.  Neck: Normal range of motion.  Respiratory: Effort normal.  Musculoskeletal: Normal range of motion.  Neurological: He is alert and oriented to person, place, and time.  Psychiatric: His speech is normal. Judgment and thought content normal. His mood appears anxious. His affect is blunt. He is actively hallucinating. Cognition and memory are normal.    Review of Systems  Psychiatric/Behavioral: Positive for hallucinations. The patient is nervous/anxious.   All other systems reviewed and are negative.   Blood pressure (!) 166/87, pulse 99, temperature 98.4 F (36.9 C), temperature source Oral, resp. rate 16, height 6' 0.01" (1.829 m), weight 76.6 kg, SpO2 94 %.Body mass index is 22.9 kg/m.  General Appearance: Casual  Eye Contact:  stares  Speech:   minimal  Volume:  Decreased  Mood:  Anxious and Irritable  Affect:  Flat  Thought Process:  Coherent and Descriptions of Associations: Intact  Orientation:  Full (Time, Place, and Person)  Thought Content:  Hallucinations: Auditory  Suicidal Thoughts:  No  Homicidal Thoughts:  No  Memory:  Immediate;   Fair  Judgement:  Impaired  Insight:  Lacking  Psychomotor Activity:  Decreased  Concentration:  Concentration: Fair and Attention Span: Fair  Recall:  [employer] of Knowledge:  Fair  Language:  Fair  Akathisia:  No  Handed:  Right  AIMS (if indicated):     Assets:  Housing Leisure Time Resilience Social Support  ADL's:  Impaired  Cognition:  Impaired,  Mild  Sleep:        Treatment Plan Summary: Daily contact with patient to assess and evaluate symptoms and progress in treatment, Medication management and Plan schizoaffective disorder, bipolar type:  -Continue Seroquel 400 mg at bedtime  Alcohol abuse: -Continue Ativan  alcohol detox protocol  Agitation: -Continue Zyprexa 5 mg BID PRN OR -Continue Ativan 2 mg every six hours PRN  Disposition: Supportive therapy provided about ongoing stressors.  Nanine Means, NP 06/09/2019 1:27 PM   Case discussed and plan agreed upon as outlined above with Dr. Shaune Pollack

## 2019-06-10 ENCOUNTER — Inpatient Hospital Stay: Payer: Medicaid Other

## 2019-06-10 DIAGNOSIS — F250 Schizoaffective disorder, bipolar type: Secondary | ICD-10-CM | POA: Diagnosis not present

## 2019-06-10 LAB — COMPREHENSIVE METABOLIC PANEL
ALT: 13 U/L (ref 0–44)
AST: 16 U/L (ref 15–41)
Albumin: 3.3 g/dL — ABNORMAL LOW (ref 3.5–5.0)
Alkaline Phosphatase: 77 U/L (ref 38–126)
Anion gap: 6 (ref 5–15)
BUN: 10 mg/dL (ref 6–20)
CO2: 23 mmol/L (ref 22–32)
Calcium: 8.2 mg/dL — ABNORMAL LOW (ref 8.9–10.3)
Chloride: 110 mmol/L (ref 98–111)
Creatinine, Ser: 0.55 mg/dL — ABNORMAL LOW (ref 0.61–1.24)
GFR calc Af Amer: >60 mL/min (ref >60)
GFR calc non Af Amer: >60 mL/min (ref >60)
Glucose, Bld: 107 mg/dL — ABNORMAL HIGH (ref 70–99)
Potassium: 3.9 mmol/L (ref 3.5–5.1)
Sodium: 139 mmol/L (ref 135–145)
Total Bilirubin: 1.2 mg/dL (ref 0.3–1.2)
Total Protein: 6.2 g/dL — ABNORMAL LOW (ref 6.5–8.1)

## 2019-06-10 LAB — BLOOD GAS, ARTERIAL
Acid-base deficit: 3.3 mmol/L — ABNORMAL HIGH (ref 0.0–2.0)
Allens test (pass/fail): POSITIVE — AB
Bicarbonate: 23.2 mmol/L (ref 20.0–28.0)
FIO2: 0.4
MECHVT: 450 mL
O2 Saturation: 99 %
PEEP: 5 cmH2O
Patient temperature: 37
RATE: 16 resp/min
pCO2 arterial: 46 mmHg (ref 32.0–48.0)
pH, Arterial: 7.31 — ABNORMAL LOW (ref 7.350–7.450)
pO2, Arterial: 143 mmHg — ABNORMAL HIGH (ref 83.0–108.0)

## 2019-06-10 LAB — URINE DRUG SCREEN, QUALITATIVE (ARMC ONLY)
Amphetamines, Ur Screen: NOT DETECTED
Barbiturates, Ur Screen: NOT DETECTED
Benzodiazepine, Ur Scrn: POSITIVE — AB
Cannabinoid 50 Ng, Ur ~~LOC~~: POSITIVE — AB
Cocaine Metabolite,Ur ~~LOC~~: NOT DETECTED
MDMA (Ecstasy)Ur Screen: NOT DETECTED
Methadone Scn, Ur: NOT DETECTED
Opiate, Ur Screen: NOT DETECTED
Phencyclidine (PCP) Ur S: NOT DETECTED
Tricyclic, Ur Screen: POSITIVE — AB

## 2019-06-10 LAB — CBC
HCT: 39.5 % (ref 39.0–52.0)
Hemoglobin: 13.7 g/dL (ref 13.0–17.0)
MCH: 34.2 pg — ABNORMAL HIGH (ref 26.0–34.0)
MCHC: 34.7 g/dL (ref 30.0–36.0)
MCV: 98.5 fL (ref 80.0–100.0)
Platelets: 131 10*3/uL — ABNORMAL LOW (ref 150–400)
RBC: 4.01 MIL/uL — ABNORMAL LOW (ref 4.22–5.81)
RDW: 12.7 % (ref 11.5–15.5)
WBC: 6.9 10*3/uL (ref 4.0–10.5)
nRBC: 0 % (ref 0.0–0.2)

## 2019-06-10 LAB — URINALYSIS, COMPLETE (UACMP) WITH MICROSCOPIC
Bacteria, UA: NONE SEEN
Bilirubin Urine: NEGATIVE
Glucose, UA: NEGATIVE mg/dL
Hgb urine dipstick: NEGATIVE
Ketones, ur: 20 mg/dL — AB
Leukocytes,Ua: NEGATIVE
Nitrite: NEGATIVE
Protein, ur: NEGATIVE mg/dL
Specific Gravity, Urine: 1.01 (ref 1.005–1.030)
Squamous Epithelial / HPF: NONE SEEN (ref 0–5)
pH: 7 (ref 5.0–8.0)

## 2019-06-10 LAB — MAGNESIUM: Magnesium: 1.9 mg/dL (ref 1.7–2.4)

## 2019-06-10 LAB — AMMONIA: Ammonia: 37 umol/L — ABNORMAL HIGH (ref 9–35)

## 2019-06-10 LAB — TRIGLYCERIDES: Triglycerides: 497 mg/dL — ABNORMAL HIGH (ref <150)

## 2019-06-10 LAB — PHOSPHORUS: Phosphorus: 2.5 mg/dL (ref 2.5–4.6)

## 2019-06-10 MED ORDER — QUETIAPINE FUMARATE 25 MG PO TABS
200.0000 mg | ORAL_TABLET | Freq: Every day | ORAL | Status: DC
  Administered 2019-06-10: 21:00:00 200 mg via ORAL
  Filled 2019-06-10: qty 8

## 2019-06-10 MED ORDER — MIDAZOLAM HCL 2 MG/2ML IJ SOLN
INTRAMUSCULAR | Status: AC
  Administered 2019-06-10: 15:00:00 2 mg via INTRAVENOUS
  Filled 2019-06-10: qty 2

## 2019-06-10 MED ORDER — METHYLPREDNISOLONE SODIUM SUCC 125 MG IJ SOLR
125.0000 mg | Freq: Once | INTRAMUSCULAR | Status: AC
  Administered 2019-06-10: 15:00:00 125 mg via INTRAVENOUS

## 2019-06-10 MED ORDER — MIDAZOLAM HCL 2 MG/2ML IJ SOLN
INTRAMUSCULAR | Status: AC
  Administered 2019-06-10: 4 mg via INTRAVENOUS
  Filled 2019-06-10: qty 4

## 2019-06-10 MED ORDER — ROCURONIUM BROMIDE 50 MG/5ML IV SOLN
INTRAVENOUS | Status: AC
  Administered 2019-06-10: 25 mg via INTRAVENOUS
  Filled 2019-06-10: qty 1

## 2019-06-10 MED ORDER — DEXMEDETOMIDINE HCL IN NACL 400 MCG/100ML IV SOLN
0.4000 ug/kg/h | INTRAVENOUS | Status: DC
  Administered 2019-06-10 (×2): 1.2 ug/kg/h via INTRAVENOUS
  Administered 2019-06-11: 11:00:00 1 ug/kg/h via INTRAVENOUS
  Administered 2019-06-11 – 2019-06-12 (×3): 1.2 ug/kg/h via INTRAVENOUS
  Administered 2019-06-12: 01:00:00 0.8 ug/kg/h via INTRAVENOUS
  Administered 2019-06-12: 1.2 ug/kg/h via INTRAVENOUS
  Administered 2019-06-12 (×2): 0.8 ug/kg/h via INTRAVENOUS
  Administered 2019-06-13 (×2): 1.2 ug/kg/h via INTRAVENOUS
  Filled 2019-06-10 (×13): qty 100

## 2019-06-10 MED ORDER — ETOMIDATE 2 MG/ML IV SOLN
INTRAVENOUS | Status: AC
  Administered 2019-06-10: 20 mg via INTRAVENOUS
  Filled 2019-06-10: qty 10

## 2019-06-10 MED ORDER — SODIUM CHLORIDE 0.9 % IV SOLN
2.0000 mg/h | INTRAVENOUS | Status: DC
  Filled 2019-06-10: qty 10

## 2019-06-10 MED ORDER — DOXEPIN HCL 50 MG PO CAPS
150.0000 mg | ORAL_CAPSULE | Freq: Every day | ORAL | Status: DC
  Administered 2019-06-10 – 2019-06-13 (×4): 150 mg via ORAL
  Filled 2019-06-10 (×5): qty 1

## 2019-06-10 MED ORDER — MIDAZOLAM 50MG/50ML (1MG/ML) PREMIX INFUSION
2.0000 mg/h | INTRAVENOUS | Status: DC
  Filled 2019-06-10: qty 50

## 2019-06-10 MED ORDER — ORAL CARE MOUTH RINSE
15.0000 mL | OROMUCOSAL | Status: DC
  Administered 2019-06-10 – 2019-06-20 (×93): 15 mL via OROMUCOSAL

## 2019-06-10 MED ORDER — ROCURONIUM BROMIDE 50 MG/5ML IV SOLN
100.0000 mg | Freq: Once | INTRAVENOUS | Status: AC
  Administered 2019-06-10: 13:00:00 25 mg via INTRAVENOUS

## 2019-06-10 MED ORDER — LACTATED RINGERS BOLUS PEDS: 1000.0000 mL | Freq: Once | INTRAVENOUS | Status: DC

## 2019-06-10 MED ORDER — MIDAZOLAM HCL 2 MG/2ML IJ SOLN
2.0000 mg | Freq: Once | INTRAMUSCULAR | Status: AC
  Administered 2019-06-10: 15:00:00 2 mg via INTRAVENOUS

## 2019-06-10 MED ORDER — ETOMIDATE 2 MG/ML IV SOLN
20.0000 mg | Freq: Once | INTRAVENOUS | Status: AC
  Administered 2019-06-10: 13:00:00 20 mg via INTRAVENOUS

## 2019-06-10 MED ORDER — MIDAZOLAM HCL 2 MG/2ML IJ SOLN
2.0000 mg | INTRAMUSCULAR | Status: DC | PRN
  Filled 2019-06-10: qty 2

## 2019-06-10 MED ORDER — FENTANYL 2500MCG IN NS 250ML (10MCG/ML) PREMIX INFUSION
0.0000 ug/h | INTRAVENOUS | Status: DC
  Administered 2019-06-10: 200 ug/h via INTRAVENOUS
  Filled 2019-06-10: qty 250

## 2019-06-10 MED ORDER — SODIUM CHLORIDE 0.9 % IV SOLN
2.2000 mg/kg/h | INTRAVENOUS | Status: DC
  Administered 2019-06-10: 3 mg/kg/h via INTRAVENOUS
  Administered 2019-06-10 – 2019-06-11 (×2): 2.2 mg/kg/h via INTRAVENOUS
  Filled 2019-06-10 (×5): qty 25

## 2019-06-10 MED ORDER — CHLORHEXIDINE GLUCONATE 0.12% ORAL RINSE (MEDLINE KIT)
15.0000 mL | Freq: Two times a day (BID) | OROMUCOSAL | Status: DC
  Administered 2019-06-10 – 2019-07-05 (×41): 15 mL via OROMUCOSAL

## 2019-06-10 MED ORDER — MIDAZOLAM HCL 2 MG/2ML IJ SOLN
4.0000 mg | Freq: Once | INTRAMUSCULAR | Status: AC
  Administered 2019-06-10: 16:00:00 4 mg via INTRAVENOUS

## 2019-06-10 MED ORDER — PROPOFOL 1000 MG/100ML IV EMUL
5.0000 ug/kg/min | INTRAVENOUS | Status: DC
  Administered 2019-06-10: 20 ug/kg/min via INTRAVENOUS
  Filled 2019-06-10: qty 100

## 2019-06-10 MED ORDER — VECURONIUM BROMIDE 10 MG IV SOLR
INTRAVENOUS | Status: AC
  Administered 2019-06-10: 16:00:00 10 mg via INTRAVENOUS
  Filled 2019-06-10: qty 10

## 2019-06-10 MED ORDER — VECURONIUM BROMIDE 10 MG IV SOLR
10.0000 mg | Freq: Once | INTRAVENOUS | Status: AC
  Administered 2019-06-10: 16:00:00 10 mg via INTRAVENOUS

## 2019-06-10 MED ORDER — LACTATED RINGERS IV BOLUS
1000.0000 mL | Freq: Once | INTRAVENOUS | Status: AC
  Administered 2019-06-10: 1000 mL via INTRAVENOUS

## 2019-06-10 MED ORDER — LACTATED RINGERS IV BOLUS: 1000.0000 mL | Freq: Once | INTRAVENOUS | Status: DC

## 2019-06-10 MED ORDER — METHYLPREDNISOLONE SODIUM SUCC 125 MG IJ SOLR
INTRAMUSCULAR | Status: AC
  Administered 2019-06-10: 15:00:00 125 mg via INTRAVENOUS
  Filled 2019-06-10: qty 2

## 2019-06-10 NOTE — Progress Notes (Signed)
Physical assessment limited due to patient refusing for me to touch him at this time. Refused for temperature to be taken. Patient verbally abusive by cursing at nursing staff and threatening to hit [place]. Attempted redirection with no success. Will continue to monitor and attempt to further physically assess and take temperature once patient calms down.

## 2019-06-10 NOTE — Progress Notes (Signed)
Patient's behaviors continued to escalate and transferred to ICU.  His actions escalated higher and he was intubated.  Ammonia and other labs ordered.  This provider talked to his "wife" and his mother yesterday and they reported he has not had Suboxone in a "long time" but according to the Drain he received 21 on 06/05/19 along with Adderall and Pregabalin.  According to the records, he is receiving it on a regular basis.  However, typical opioid withdrawal reactions mimic flu like symptoms with irritability but not agitation, especially to this level.  He was on Doxepin 200 mg at bedtime and this should be tapered off to prevent agitation.  The Ativan did not seem to help his agitation either.  Family reported he was managed at home on Seroquel and baclofen.  Previous notes state he was also on Zyprexa.  Dr Mallie Darting suggested restarting the Seroquel, Seroquel started back at 200 mg at bedtime vs 400 mg after he is extubated.  [name], PMHNP
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
## 2019-06-10 NOTE — Procedures (Signed)
Endotracheal Intubation: Patient required placement of an artificial airway secondary to Respiratory Failure  Consent: Emergent.   Hand washing performed prior to starting the procedure.   Medications administered for sedation prior to procedure:  Etomidate and Rocuronium   A time out procedure was called and correct patient, name, & ID confirmed. Needed supplies and equipment were assembled and checked to include ETT, 10 ml syringe, Glidescope, Mac and Miller blades, suction, oxygen and bag mask valve, end tidal CO2 monitor.   Patient was positioned to align the mouth and pharynx to facilitate visualization of the glottis.   Heart rate, SpO2 and blood pressure was continuously monitored during the procedure. Pre-oxygenation was conducted prior to intubation and endotracheal tube was placed through the vocal cords into the trachea.     The artificial airway was placed under direct visualization via glidescope route using a 7.5 ETT on the first attempt.  ETT was secured at 23 cm mark.  Placement was confirmed by auscuitation of lungs with good breath sounds bilaterally and no stomach sounds.  Condensation was noted on endotracheal tube.   Pulse ox 98%.  CO2 detector in place with appropriate color change.   Complications: None .   Marland Kitchen   Chest radiograph ordered and pending.   Comments: OGT placed via glidescope.    Ottie Glazier, M.D.  Pulmonary & [place]

## 2019-06-10 NOTE — Consult Note (Addendum)
[facility] Consult   Reason for Consult:  Medications for schizoaffective disorder Referring Physician:  Dr Sabra Heck Patient Identification: Thomas [name] MRN:  [id number] Principal Diagnosis: Ischemia of extremity Diagnosis:  Principal Problem:   Ischemia of extremity Active Problems:   Schizoaffective disorder, bipolar type ([place])  Total Time spent with patient: 30 minutes  Subjective:   Thomas [name] is a 53 y.o. male patient admitted with orthopedic and vascular injury of the right ankle/foot.  Psychiatry consult was called due to patient displaying aggressive behavior to nursing staff and history of bipolar schizophrenia off medications.  06/10/19: Patient's behaviors continued to escalate and transferred to ICU.  His actions escalated higher and he was intubated.  Ammonia and other labs ordered.  This provider talked to his "wife" and his mother yesterday and they reported he has not had Suboxone in a "long time" but according to the Thomas [name] he received 21 on 06/05/19 along with Adderall and Pregabalin.  According to the records, he is receiving it on a regular basis.  However, typical opioid withdrawal reactions mimic flu like symptoms with irritability but not agitation, especially to this level.  He was on Doxepin 200 mg at bedtime and this should be tapered off to prevent agitation.  The Ativan did not seem to help his agitation either.  Family reported he was managed at home on Seroquel and baclofen.  Previous notes state he was also on Zyprexa.  Dr Mallie Darting suggested restarting the Seroquel, Seroquel started back at 200 mg at bedtime vs 400 mg after he is extubated.  Later on 06/09/19: Patient's mother and his friend/wife Thomas [name] called for collateral information.  No recent alcohol use and no history of benzodiazepine use or abuse.  He takes Seroquel sporadically at home.  He does take Sonata, Lyrica, and Doxepin at bedtime.  Right now he is on pain medications and will  not restart this along with Doxepin could increase his agitation.  Ativan alcohol detox protocol discontinued based on collateral information and Zyprexa 10 mg TID started, Seroquel discontinued to prevent oversedation.  06/09/19: Patient seen and evaluated in person by this provider.  Patient irritable on assessment, appears to be responding to internal stimuli at times as he stares at this provider and then off to the side.  Minimal verbal interaction on assessment.  Medication recommended placed yesterday by Dr Sheliah Mends.  Calm and no agitation notes placed at this time, agitation medications are in place.  Please avoid giving Ativan and Zyprexa together due to potential of dropping the pulse and BP too quickly.  HPI per psychiatrist: Patient is a 53 year old male presented to the emergency room with orthopedic and vascular injury of the right foot.  Last night shortly after presentation patient was displaying agitated and aggressive behaviors but due to the the need to operate on his foot was brought into surgery.  Overnight patient was abusive and aggressive with the nurses.   today patient had another operation and was seen in PACU shortly following procedure.  Due to sedation writer was unable to fully evaluate patient.  This was discussed with treatment team who asked for recommendations regarding acute agitation in case patient were to become belligerent again tonight. Team is unsure of substance abuse problems given pain out of proportion to exam and therefore he was placed on CIWA protocol. Chart reviewed and case discussed with treatment team.  Pharmacy was called and medication history was reviewed.  More recently patient was prescribed Zyprexa 5 mg,  doxepin unknown milligrams, and Adderall 20 mg twice daily.  Past Psychiatric History: History of schizoaffective bipolar type. per mother patient has been noncompliant with his medications.  Risk to Self:  none Risk to Others:  none Prior  Inpatient Therapy:  yes Prior Outpatient Therapy:  yes  Past Medical History:  Past Medical History:  Diagnosis Date  . Alcohol abuse   . Anemia   . Bipolar 1 disorder ([place])   . Cervical spine fracture ([place])   . Chronic, continuous use of opioids   . History of seizure   . HTN (hypertension)   . Insomnia   . MDD (major depressive disorder), recurrent, severe, with psychosis ([place])   . Pancreatitis    History reviewed. No pertinent surgical history. Family History: History reviewed. No pertinent family history. Family Psychiatric  History: none Social History:  Social History   Substance and Sexual Activity  Alcohol Use Yes   Comment: occasionally     Social History   Substance and Sexual Activity  Drug Use Not on file    Social History   Socioeconomic History  . Marital status: Single    Spouse name: Not on file  . Number of children: Not on file  . Years of education: Not on file  . Highest education level: Not on file  Occupational History  . Not on file  Social Needs  . Financial resource strain: Not on file  . Food insecurity    Worry: Not on file    Inability: Not on file  . Transportation needs    Medical: Not on file    Non-medical: Not on file  Tobacco Use  . Smoking status: Current Every Day Smoker  . Smokeless tobacco: Never Used  Substance and Sexual Activity  . Alcohol use: Yes    Comment: occasionally  . Drug use: Not on file  . Sexual activity: Not on file  Lifestyle  . Physical activity    Days per week: Not on file    Minutes per session: Not on file  . Stress: Not on file  Relationships  . Social Herbalist on phone: Not on file    Gets together: Not on file    Attends religious service: Not on file    Active member of club or organization: Not on file    Attends meetings of clubs or organizations: Not on file    Relationship status: Not on file  Other Topics Concern  . Not on file  Social History Narrative  . Not on  file   Additional Social History:  none    Allergies:   Allergies  Allergen Reactions  . Haldol [Haloperidol Lactate]     Labs:  Results for orders placed or performed during the hospital encounter of 06/07/19 (from the past 48 hour(s))  CBC     Status: Abnormal   Collection Time: 06/09/19  4:11 AM  Result Value Ref Range   WBC 6.4 4.0 - 10.5 K/uL   RBC 4.01 (L) 4.22 - 5.81 MIL/uL   Hemoglobin 13.5 13.0 - 17.0 g/dL   HCT 40.6 39.0 - 52.0 %   MCV 101.2 (H) 80.0 - 100.0 fL   MCH 33.7 26.0 - 34.0 pg   MCHC 33.3 30.0 - 36.0 g/dL   RDW 13.9 11.5 - 15.5 %   Platelets 126 (L) 150 - 400 K/uL    Comment: Immature Platelet Fraction may be clinically indicated, consider ordering this additional test [id number]  nRBC 0.0 0.0 - 0.2 %    Comment: Performed at [facility], [address] [place], [place] [address]  Basic metabolic panel     Status: Abnormal   Collection Time: 06/09/19  4:11 AM  Result Value Ref Range   Sodium 140 135 - 145 mmol/L   Potassium 4.1 3.5 - 5.1 mmol/L   Chloride 105 98 - 111 mmol/L   CO2 25 22 - 32 mmol/L   Glucose, Bld 99 70 - 99 mg/dL   BUN 8 6 - 20 mg/dL   Creatinine, Ser 0.65 0.61 - 1.24 mg/dL   Calcium 8.2 (L) 8.9 - 10.3 mg/dL   GFR calc non Af Amer >60 >60 mL/min   GFR calc Af Amer >60 >60 mL/min   Anion gap 10 5 - 15    Comment: Performed at [facility], [address] [place], [place] [address]  Magnesium     Status: None   Collection Time: 06/09/19  4:11 AM  Result Value Ref Range   Magnesium 2.1 1.7 - 2.4 mg/dL    Comment: Performed at [facility], [address] [place], [place] [address]  Glucose, capillary     Status: None   Collection Time: 06/09/19  4:12 PM  Result Value Ref Range   Glucose-Capillary 97 70 - 99 mg/dL  CBC     Status: Abnormal   Collection Time: 06/10/19  4:51 AM  Result Value Ref Range   WBC 6.9 4.0 - 10.5 K/uL   RBC 4.01 (L) 4.22 - 5.81 MIL/uL   Hemoglobin 13.7 13.0 - 17.0  g/dL   HCT 39.5 39.0 - 52.0 %   MCV 98.5 80.0 - 100.0 fL   MCH 34.2 (H) 26.0 - 34.0 pg   MCHC 34.7 30.0 - 36.0 g/dL   RDW 12.7 11.5 - 15.5 %   Platelets 131 (L) 150 - 400 K/uL   nRBC 0.0 0.0 - 0.2 %    Comment: Performed at [facility], [address] [place], [place] [address]  Comprehensive metabolic panel     Status: Abnormal   Collection Time: 06/10/19  4:51 AM  Result Value Ref Range   Sodium 139 135 - 145 mmol/L   Potassium 3.9 3.5 - 5.1 mmol/L   Chloride 110 98 - 111 mmol/L   CO2 23 22 - 32 mmol/L   Glucose, Bld 107 (H) 70 - 99 mg/dL   BUN 10 6 - 20 mg/dL   Creatinine, Ser 0.55 (L) 0.61 - 1.24 mg/dL   Calcium 8.2 (L) 8.9 - 10.3 mg/dL   Total Protein 6.2 (L) 6.5 - 8.1 g/dL   Albumin 3.3 (L) 3.5 - 5.0 g/dL   AST 16 15 - 41 U/L   ALT 13 0 - 44 U/L   Alkaline Phosphatase 77 38 - 126 U/L   Total Bilirubin 1.2 0.3 - 1.2 mg/dL   GFR calc non Af Amer >60 >60 mL/min   GFR calc Af Amer >60 >60 mL/min   Anion gap 6 5 - 15    Comment: Performed at [facility], [address], [place],  [address]  Magnesium     Status: None   Collection Time: 06/10/19  4:51 AM  Result Value Ref Range   Magnesium 1.9 1.7 - 2.4 mg/dL    Comment: Performed at [facility], [address], [place],  [address]  Phosphorus     Status: None   Collection Time: 06/10/19  4:51 AM  Result Value Ref Range   Phosphorus 2.5 2.5 - 4.6 mg/dL    Comment: Performed at [facility], [address] [place], [place] [address]  Urine Drug Screen, Qualitative [facility] only)     Status: Abnormal   Collection Time: 06/10/19 10:00 AM  Result Value Ref Range   Tricyclic, Ur Screen POSITIVE (A) NONE DETECTED   Amphetamines, Ur Screen NONE DETECTED NONE DETECTED   MDMA (Ecstasy)Ur Screen NONE DETECTED NONE DETECTED   Cocaine Metabolite,Ur [place] NONE DETECTED NONE DETECTED   Opiate, Ur Screen NONE DETECTED NONE DETECTED   Phencyclidine (PCP) Ur S NONE DETECTED NONE  DETECTED   Cannabinoid 50 Ng, Ur [place] POSITIVE (A) NONE DETECTED   Barbiturates, Ur Screen NONE DETECTED NONE DETECTED   Benzodiazepine, Ur Scrn POSITIVE (A) NONE DETECTED   Methadone Scn, Ur NONE DETECTED NONE DETECTED    Comment: (NOTE) Tricyclics + metabolites, urine    Cutoff 1000 ng/mL Amphetamines + metabolites, urine  Cutoff 1000 ng/mL MDMA (Ecstasy), urine              Cutoff 500 ng/mL Cocaine Metabolite, urine          Cutoff 300 ng/mL Opiate + metabolites, urine        Cutoff 300 ng/mL Phencyclidine (PCP), urine         Cutoff 25 ng/mL Cannabinoid, urine                 Cutoff 50 ng/mL Barbiturates + metabolites, urine  Cutoff 200 ng/mL Benzodiazepine, urine              Cutoff 200 ng/mL Methadone, urine                   Cutoff 300 ng/mL The urine drug screen provides only a preliminary, unconfirmed analytical test result and should not be used for non-medical purposes. Clinical consideration and professional judgment should be applied to any positive drug screen result due to possible interfering substances. A more specific alternate chemical method must be used in order to obtain a confirmed analytical result. Gas chromatography / mass spectrometry (GC/[place]) is the preferred confirmat ory method. Performed at [facility], [address] [place], [place] [address]   Urinalysis, Complete w Microscopic     Status: Abnormal   Collection Time: 06/10/19 12:53 PM  Result Value Ref Range   Color, Urine YELLOW (A) YELLOW   APPearance CLEAR (A) CLEAR   Specific Gravity, Urine 1.010 1.005 - 1.030   pH 7.0 5.0 - 8.0   Glucose, UA NEGATIVE NEGATIVE mg/dL   Hgb urine dipstick NEGATIVE NEGATIVE   Bilirubin Urine NEGATIVE NEGATIVE   Ketones, ur 20 (A) NEGATIVE mg/dL   Protein, ur NEGATIVE NEGATIVE mg/dL   Nitrite NEGATIVE NEGATIVE   Leukocytes,Ua NEGATIVE NEGATIVE   RBC / HPF 0-5 0 - 5 RBC/hpf   WBC, UA 0-5 0 - 5 WBC/hpf   Bacteria, UA NONE SEEN NONE SEEN   Squamous  Epithelial / LPF NONE SEEN 0 - 5   Mucus PRESENT     Comment: Performed at [facility], [address] [place], [place] [address]  Triglycerides     Status: Abnormal   Collection Time: 06/10/19  1:07 PM  Result Value Ref Range   Triglycerides 497 (H) <150 mg/dL    Comment: Performed at [facility], [address] [place], [place] [address]  Ammonia     Status: Abnormal   Collection Time: 06/10/19  1:07 PM  Result Value Ref Range   Ammonia 37 (H) 9 - 35 umol/L    Comment: Performed at [facility], [address] [place], [place] [address]  Blood gas, arterial     Status: Abnormal   Collection Time: 06/10/19  1:45 PM  Result Value Ref Range   FIO2 0.40    Delivery systems VENTILATOR    VT 450 mL   LHR 16 resp/min   Peep/cpap 5.0 cm H20   pH, Arterial 7.31 (L) 7.350 - 7.450   pCO2 arterial 46 32.0 - 48.0 mmHg   pO2, Arterial 143 (H) 83.0 - 108.0 mmHg   Bicarbonate 23.2 20.0 - 28.0 mmol/L   Acid-base deficit 3.3 (H) 0.0 - 2.0 mmol/L   O2 Saturation 99.0 %   Patient temperature 37.0    Collection site LEFT RADIAL    Sample type ARTHROGRAPHIS SPECIES    Allens test (pass/fail) POSITIVE (A) PASS    Comment: Performed at [facility], [address] [place], [place] [address]    Current Facility-Administered Medications  Medication Dose Route Frequency Provider Last Rate Last Dose  . 0.45 % sodium chloride infusion   Intravenous Continuous Earnestine Leys, MD 150 mL/hr at 06/10/19 1504    . acetaminophen (TYLENOL) tablet 650 mg  650 mg Oral Q4H PRN Earnestine Leys, MD      . baclofen (LIORESAL) tablet 20 mg  20 mg Oral TID Earnestine Leys, MD      . bisacodyl (DULCOLAX) suppository 10 mg  10 mg Rectal Daily PRN Earnestine Leys, MD      . chlorhexidine gluconate (MEDLINE KIT) (PERIDEX) 0.12 % solution 15 mL  15 mL Mouth Rinse BID Ottie Glazier, MD   15 mL at 06/10/19 1441  . Chlorhexidine Gluconate Cloth 2 % PADS 6 each  6 each Topical  Daily Ottie Glazier, MD   6 each at 06/09/19 1608  . clopidogrel (PLAVIX) tablet 75 mg  75 mg Oral Daily Earnestine Leys, MD      . dexmedetomidine (PRECEDEX) 400 MCG/100ML (4 mcg/mL) infusion  0.4-1.2 mcg/kg/hr Intravenous Titrated Ottie Glazier, MD 23 mL/hr at 06/10/19 1619 1.2 mcg/kg/hr at 06/10/19 1619  . diphenhydrAMINE (BENADRYL) injection 12.5 mg  12.5 mg Intravenous Q6H PRN Earnestine Leys, MD   12.5 mg at 06/10/19 2094   Or  . diphenhydrAMINE (BENADRYL) 12.5 MG/5ML elixir 12.5 mg  12.5 mg Oral Q6H PRN Earnestine Leys, MD      . docusate sodium (COLACE) capsule 100 mg  100 mg Oral BID Earnestine Leys, MD      . doxepin (SINEQUAN) capsule 150 mg  150 mg Oral QHS Patrecia Pour, NP      . enoxaparin (LOVENOX) injection 40 mg  40 mg Subcutaneous Q24H Schnier, Dolores Lory, MD      . folic acid (FOLVITE) tablet 1 mg  1 mg Oral Daily Earnestine Leys, MD      . gabapentin (NEURONTIN) capsule 400 mg  400 mg Oral TID Earnestine Leys, MD      . HYDROmorphone (DILAUDID) injection 0.5-1 mg  0.5-1 mg Intravenous Q2H PRN Earnestine Leys, MD   1 mg at 06/08/19 0557  . influenza vac split quadrivalent PF (FLUARIX) injection 0.5 mL  0.5 mL Intramuscular Tomorrow-1000 Earnestine Leys, MD      . ketamine (KETALAR) 2,500 mg in sodium chloride 0.9 % 500 mL (5 mg/mL) infusion  3 mg/kg/hr Intravenous Continuous Ottie Glazier, MD 46 mL/hr at 06/10/19 1604 3 mg/kg/hr at 06/10/19 1604  .  magnesium hydroxide (MILK OF MAGNESIA) suspension 30 mL  30 mL Oral Daily PRN Earnestine Leys, MD      . MEDLINE mouth rinse  15 mL Mouth Rinse 10 times per day Ottie Glazier, MD   15 mL at 06/10/19 1512  . methocarbamol (ROBAXIN) tablet 500 mg  500 mg Oral Q6H PRN Earnestine Leys, MD       Or  . methocarbamol (ROBAXIN) 500 mg in dextrose 5 % 50 mL IVPB  500 mg Intravenous Q6H PRN Earnestine Leys, MD      . metoCLOPramide (REGLAN) tablet 5-10 mg  5-10 mg Oral Q8H PRN Earnestine Leys, MD       Or  . metoCLOPramide (REGLAN) injection  5-10 mg  5-10 mg Intravenous Q8H PRN Earnestine Leys, MD      . morphine 2 MG/ML injection 2 mg  2 mg Intravenous Q1H PRN Earnestine Leys, MD   2 mg at 06/10/19 1104  . multivitamin with minerals tablet 1 tablet  1 tablet Oral Daily Earnestine Leys, MD      . naloxone [facility]) injection 0.4 mg  0.4 mg Intravenous PRN Earnestine Leys, MD       And  . sodium chloride flush (NS) 0.9 % injection 9 mL  9 mL Intravenous PRN Earnestine Leys, MD      . nicotine (NICODERM CQ - dosed in mg/24 hours) patch 21 mg  21 mg Transdermal Daily Earnestine Leys, MD   21 mg at 06/09/19 1000  . OLANZapine (ZYPREXA) tablet 10 mg  10 mg Oral TID Patrecia Pour, NP       Or  . OLANZapine (ZYPREXA) injection 10 mg  10 mg Intramuscular TID Patrecia Pour, NP   10 mg at 06/10/19 1055  . ondansetron (ZOFRAN) injection 4 mg  4 mg Intravenous Q6H PRN Earnestine Leys, MD      . ondansetron [facility]) tablet 4 mg  4 mg Oral Q6H PRN Earnestine Leys, MD       Or  . ondansetron [facility]) injection 4 mg  4 mg Intravenous Q6H PRN Earnestine Leys, MD      . ondansetron [facility]) injection 4 mg  4 mg Intravenous Q6H PRN Earnestine Leys, MD      . oxyCODONE (Oxy IR/ROXICODONE) immediate release tablet 5-10 mg  5-10 mg Oral Q4H PRN Earnestine Leys, MD   10 mg at 06/08/19 0347  . rosuvastatin (CRESTOR) tablet 10 mg  10 mg Oral q1800 Serafina Mitchell, MD      . sodium chloride flush (NS) 0.9 % injection 3 mL  3 mL Intravenous PRN Earnestine Leys, MD      . sodium phosphate (FLEET) 7-19 GM/118ML enema 1 enema  1 enema Rectal Once PRN Earnestine Leys, MD      . thiamine (VITAMIN B-1) tablet 100 mg  100 mg Oral Daily Earnestine Leys, MD       Or  . thiamine (B-1) injection 100 mg  100 mg Intravenous Daily Earnestine Leys, MD   100 mg at 06/10/19 1055    Musculoskeletal: Strength & Muscle Tone: decreased Gait & Station: did not witness Patient leans: N/A  Psychiatric Specialty Exam: Physical Exam  Nursing note and vitals reviewed. Constitutional:  He appears well-developed and well-nourished.  HENT:  Head: Normocephalic.  Respiratory: Effort normal.  Psychiatric: His affect is blunt. He is slowed. Cognition and memory are impaired. He expresses impulsivity and inappropriate judgment.    Review of Systems  All other systems reviewed and are  negative.   Blood pressure 94/76, pulse 95, temperature 98.8 F (37.1 C), temperature source Oral, resp. rate 19, height 6' 0.01" (1.829 m), weight 76.6 kg, SpO2 96 %.Body mass index is 22.9 kg/m.  General Appearance: Casual  Eye Contact:  intubated  Speech:  intubated  Volume:  intubated  Mood:  intubated  Affect:  Blunt  Thought Process:  Unable to assess  Orientation:  Unable to assess  Thought Content:  Unable to assess  Suicidal Thoughts:  Unable to assess  Homicidal Thoughts:  Unable to assess  Memory:  Unable to assess  Judgement:  Impaired  Insight:  Lacking  Psychomotor Activity:  Decreased  Concentration:  Unable to assess  Recall:  Intubated  Fund of Knowledge:  Intubated  Language:  None  Akathisia:  No  Handed:  Right  AIMS (if indicated):     Assets:  Housing Leisure Time Resilience Social Support  ADL's:  Impaired  Cognition:  Impaired, severe at this time d/t intubation  Sleep:        Treatment Plan Summary: Daily contact with patient to assess and evaluate symptoms and progress in treatment, Medication management and Plan schizoaffective disorder, bipolar type:  -Restart Seroquel once he is extubated at 200 mg at bedtime -Continue Zyprexa 10 mg TID   Insomnia: -Decrease Doxepin from 200 mg (home dose) to 150 mg once he is extubated  Disposition: Supportive therapy provided about ongoing stressors.  [name], NP 06/10/2019 4:44 PM   Case discussed and plan agreed upon as outlined above with Dr. Reita Cliche

## 2019-06-10 NOTE — Progress Notes (Signed)
Patient continues to be verbally abusive with cursing and threatening to hit nursing staff, continues to attempt to get out of bed and swings at nursing staff when we attempt to redirect him. Limited assessment due to aggressiveness of patient.

## 2019-06-10 NOTE — Progress Notes (Signed)
Subjective:  Patient reports pain as moderate.  He is aggressive and verbally abusive. Does not follow commands.  Objective:   VITALS:   Vitals:   06/10/19 0724 06/10/19 0800 06/10/19 0900 06/10/19 1000  BP:   (!) 166/101 (!) 181/96  Pulse: 68 69 73 69  Resp:      Temp:      TempSrc:      SpO2: 100% 100% 97% 100%  Weight:      Height:        PHYSICAL EXAM:  Sensation intact distally Compartment soft good cap refill  LABS  Results for orders placed or performed during the hospital encounter of 06/07/19 (from the past 24 hour(s))  Glucose, capillary     Status: None   Collection Time: 06/09/19  4:12 PM  Result Value Ref Range   Glucose-Capillary 97 70 - 99 mg/dL  CBC     Status: Abnormal   Collection Time: 06/10/19  4:51 AM  Result Value Ref Range   WBC 6.9 4.0 - 10.5 K/uL   RBC 4.01 (L) 4.22 - 5.81 MIL/uL   Hemoglobin 13.7 13.0 - 17.0 g/dL   HCT 39.5 39.0 - 52.0 %   MCV 98.5 80.0 - 100.0 fL   MCH 34.2 (H) 26.0 - 34.0 pg   MCHC 34.7 30.0 - 36.0 g/dL   RDW 12.7 11.5 - 15.5 %   Platelets 131 (L) 150 - 400 K/uL   nRBC 0.0 0.0 - 0.2 %  Comprehensive metabolic panel     Status: Abnormal   Collection Time: 06/10/19  4:51 AM  Result Value Ref Range   Sodium 139 135 - 145 mmol/L   Potassium 3.9 3.5 - 5.1 mmol/L   Chloride 110 98 - 111 mmol/L   CO2 23 22 - 32 mmol/L   Glucose, Bld 107 (H) 70 - 99 mg/dL   BUN 10 6 - 20 mg/dL   Creatinine, Ser 0.55 (L) 0.61 - 1.24 mg/dL   Calcium 8.2 (L) 8.9 - 10.3 mg/dL   Total Protein 6.2 (L) 6.5 - 8.1 g/dL   Albumin 3.3 (L) 3.5 - 5.0 g/dL   AST 16 15 - 41 U/L   ALT 13 0 - 44 U/L   Alkaline Phosphatase 77 38 - 126 U/L   Total Bilirubin 1.2 0.3 - 1.2 mg/dL   GFR calc non Af Amer >60 >60 mL/min   GFR calc Af Amer >60 >60 mL/min   Anion gap 6 5 - 15  Magnesium     Status: None   Collection Time: 06/10/19  4:51 AM  Result Value Ref Range   Magnesium 1.9 1.7 - 2.4 mg/dL  Phosphorus     Status: None   Collection Time: 06/10/19   4:51 AM  Result Value Ref Range   Phosphorus 2.5 2.5 - 4.6 mg/dL    Dg Ankle Complete Right  Result Date: 06/08/2019 CLINICAL DATA:  Status post surgery to right ankle. EXAM: RIGHT ANKLE - COMPLETE 3+ VIEW COMPARISON:  None. FINDINGS: A plate has been placed across the distal fibular fracture. Two screws been placed through the medial malleolus. Skin staples are identified. IMPRESSION: Repair of right ankle fractures as above. Electronically Signed   By: Dorise Bullion III M.D   On: 06/08/2019 17:37    Assessment/Plan: 2 Days Post-Op   Principal Problem:   Ischemia of extremity Active Problems:   Schizoaffective disorder, bipolar type ([place])   Patient agitated and aggressive behavior. Continue NWB and elevation on the right  leg.   Lyndle Herrlich , MD 06/10/2019, 10:54 AM

## 2019-06-11 ENCOUNTER — Encounter: Payer: Self-pay | Admitting: Vascular Surgery

## 2019-06-11 ENCOUNTER — Inpatient Hospital Stay: Payer: Medicaid Other

## 2019-06-11 DIAGNOSIS — J9601 Acute respiratory failure with hypoxia: Secondary | ICD-10-CM

## 2019-06-11 DIAGNOSIS — F10231 Alcohol dependence with withdrawal delirium: Secondary | ICD-10-CM

## 2019-06-11 DIAGNOSIS — I998 Other disorder of circulatory system: Secondary | ICD-10-CM

## 2019-06-11 LAB — BLOOD GAS, ARTERIAL
Acid-base deficit: 5.5 mmol/L — ABNORMAL HIGH (ref 0.0–2.0)
Bicarbonate: 20.7 mmol/L (ref 20.0–28.0)
FIO2: 0.28
MECHVT: 450 mL
O2 Saturation: 78.6 %
PEEP: 5 cmH2O
Patient temperature: 37
RATE: 16 resp/min
pCO2 arterial: 42 mmHg (ref 32.0–48.0)
pH, Arterial: 7.3 — ABNORMAL LOW (ref 7.350–7.450)
pO2, Arterial: 48 mmHg — ABNORMAL LOW (ref 83.0–108.0)

## 2019-06-11 LAB — CBC
HCT: 38.9 % — ABNORMAL LOW (ref 39.0–52.0)
Hemoglobin: 13 g/dL (ref 13.0–17.0)
MCH: 33.9 pg (ref 26.0–34.0)
MCHC: 33.4 g/dL (ref 30.0–36.0)
MCV: 101.3 fL — ABNORMAL HIGH (ref 80.0–100.0)
Platelets: 127 10*3/uL — ABNORMAL LOW (ref 150–400)
RBC: 3.84 MIL/uL — ABNORMAL LOW (ref 4.22–5.81)
RDW: 12.9 % (ref 11.5–15.5)
WBC: 5.1 10*3/uL (ref 4.0–10.5)
nRBC: 0 % (ref 0.0–0.2)

## 2019-06-11 LAB — COMPREHENSIVE METABOLIC PANEL
ALT: 14 U/L (ref 0–44)
AST: 19 U/L (ref 15–41)
Albumin: 3.3 g/dL — ABNORMAL LOW (ref 3.5–5.0)
Alkaline Phosphatase: 72 U/L (ref 38–126)
Anion gap: 8 (ref 5–15)
BUN: 17 mg/dL (ref 6–20)
CO2: 22 mmol/L (ref 22–32)
Calcium: 8.3 mg/dL — ABNORMAL LOW (ref 8.9–10.3)
Chloride: 109 mmol/L (ref 98–111)
Creatinine, Ser: 0.7 mg/dL (ref 0.61–1.24)
GFR calc Af Amer: >60 mL/min (ref >60)
GFR calc non Af Amer: >60 mL/min (ref >60)
Glucose, Bld: 124 mg/dL — ABNORMAL HIGH (ref 70–99)
Potassium: 4.4 mmol/L (ref 3.5–5.1)
Sodium: 139 mmol/L (ref 135–145)
Total Bilirubin: 0.9 mg/dL (ref 0.3–1.2)
Total Protein: 5.9 g/dL — ABNORMAL LOW (ref 6.5–8.1)

## 2019-06-11 LAB — LACTIC ACID, PLASMA
Lactic Acid, Venous: 0.9 mmol/L (ref 0.5–1.9)
Lactic Acid, Venous: 1.1 mmol/L (ref 0.5–1.9)

## 2019-06-11 LAB — CK: Total CK: 153 U/L (ref 49–397)

## 2019-06-11 LAB — MAGNESIUM: Magnesium: 1.7 mg/dL (ref 1.7–2.4)

## 2019-06-11 LAB — TROPONIN I (HIGH SENSITIVITY)
Troponin I (High Sensitivity): 289 ng/L (ref <18)
Troponin I (High Sensitivity): 249 ng/L (ref <18)

## 2019-06-11 LAB — PHOSPHORUS: Phosphorus: 4.3 mg/dL (ref 2.5–4.6)

## 2019-06-11 MED ORDER — QUETIAPINE FUMARATE 100 MG PO TABS
200.0000 mg | ORAL_TABLET | Freq: Every day | ORAL | Status: DC
  Administered 2019-06-11 – 2019-06-19 (×9): 200 mg
  Filled 2019-06-11 (×2): qty 8
  Filled 2019-06-11: qty 1
  Filled 2019-06-11 (×6): qty 8

## 2019-06-11 MED ORDER — SODIUM CHLORIDE 0.9 % IV SOLN
0.0000 ug/min | INTRAVENOUS | Status: DC
  Administered 2019-06-11: 20 ug/min via INTRAVENOUS
  Administered 2019-06-12: 100 ug/min via INTRAVENOUS
  Administered 2019-06-12: 70 ug/min via INTRAVENOUS
  Administered 2019-06-12: 50 ug/min via INTRAVENOUS
  Administered 2019-06-12: 45 ug/min via INTRAVENOUS
  Administered 2019-06-12: 60 ug/min via INTRAVENOUS
  Administered 2019-06-12: 80 ug/min via INTRAVENOUS
  Administered 2019-06-12: 20 ug/min via INTRAVENOUS
  Administered 2019-06-13: 22:00:00 70 ug/min via INTRAVENOUS
  Administered 2019-06-13: 45 ug/min via INTRAVENOUS
  Administered 2019-06-13: 70 ug/min via INTRAVENOUS
  Administered 2019-06-13: 45 ug/min via INTRAVENOUS
  Administered 2019-06-13: 40 ug/min via INTRAVENOUS
  Administered 2019-06-13: 17:00:00 70 ug/min via INTRAVENOUS
  Administered 2019-06-13: 65 ug/min via INTRAVENOUS
  Administered 2019-06-14: 17:00:00 30 ug/min via INTRAVENOUS
  Administered 2019-06-14: 60 ug/min via INTRAVENOUS
  Administered 2019-06-14: 30 ug/min via INTRAVENOUS
  Administered 2019-06-14 – 2019-06-15 (×3): 25 ug/min via INTRAVENOUS
  Filled 2019-06-11 (×3): qty 10
  Filled 2019-06-11: qty 1
  Filled 2019-06-11 (×10): qty 10
  Filled 2019-06-11 (×4): qty 1
  Filled 2019-06-11 (×3): qty 10
  Filled 2019-06-11 (×2): qty 1

## 2019-06-11 MED ORDER — HYDROMORPHONE BOLUS VIA INFUSION
0.2000 mg | INTRAVENOUS | Status: DC | PRN
  Administered 2019-06-12 – 2019-06-17 (×11): 0.2 mg via INTRAVENOUS
  Filled 2019-06-11: qty 1

## 2019-06-11 MED ORDER — HYDROMORPHONE HCL 1 MG/ML IJ SOLN: 0.5000 mg | Freq: Once | INTRAMUSCULAR | Status: DC

## 2019-06-11 MED ORDER — SENNOSIDES-DOCUSATE SODIUM 8.6-50 MG PO TABS
2.0000 | ORAL_TABLET | Freq: Two times a day (BID) | ORAL | Status: DC
  Administered 2019-06-11 – 2019-06-12 (×3): 2 via ORAL
  Filled 2019-06-11 (×3): qty 2

## 2019-06-11 MED ORDER — MAGNESIUM SULFATE 2 GM/50ML IV SOLN
2.0000 g | Freq: Once | INTRAVENOUS | Status: AC
  Administered 2019-06-11: 2 g via INTRAVENOUS
  Filled 2019-06-11: qty 50

## 2019-06-11 MED ORDER — FOLIC ACID 1 MG PO TABS
1.0000 mg | ORAL_TABLET | Freq: Every day | ORAL | Status: DC
  Administered 2019-06-12 – 2019-06-20 (×9): 1 mg
  Filled 2019-06-11 (×9): qty 1

## 2019-06-11 MED ORDER — CLOPIDOGREL BISULFATE 75 MG PO TABS
75.0000 mg | ORAL_TABLET | Freq: Every day | ORAL | Status: DC
  Administered 2019-06-12 – 2019-07-01 (×12): 75 mg
  Filled 2019-06-11 (×15): qty 1

## 2019-06-11 MED ORDER — DIAZEPAM 5 MG/ML IJ SOLN
5.0000 mg | Freq: Four times a day (QID) | INTRAMUSCULAR | Status: DC
  Administered 2019-06-11: 08:00:00 5 mg via INTRAVENOUS
  Filled 2019-06-11: qty 2

## 2019-06-11 MED ORDER — SODIUM CHLORIDE 0.9 % IV SOLN
0.5000 mg/h | INTRAVENOUS | Status: DC
  Administered 2019-06-11 – 2019-06-12 (×2): 2 mg/h via INTRAVENOUS
  Administered 2019-06-13 – 2019-06-14 (×4): 4 mg/h via INTRAVENOUS
  Administered 2019-06-15: 12:00:00 2 mg/h via INTRAVENOUS
  Administered 2019-06-15: 20:00:00 1.5 mg/h via INTRAVENOUS
  Administered 2019-06-17 (×2): 8 mg/h via INTRAVENOUS
  Administered 2019-06-17: 4 mg/h via INTRAVENOUS
  Administered 2019-06-18 (×2): 8 mg/h via INTRAVENOUS
  Administered 2019-06-18: 6 mg/h via INTRAVENOUS
  Administered 2019-06-18: 8 mg/h via INTRAVENOUS
  Administered 2019-06-19: 4 mg/h via INTRAVENOUS
  Administered 2019-06-19: 07:00:00 8 mg/h via INTRAVENOUS
  Administered 2019-06-20: 4 mg/h via INTRAVENOUS
  Filled 2019-06-11 (×18): qty 5

## 2019-06-11 MED ORDER — ADULT MULTIVITAMIN LIQUID CH
15.0000 mL | Freq: Every day | ORAL | Status: DC
  Administered 2019-06-12 – 2019-07-09 (×20): 15 mL
  Filled 2019-06-11 (×28): qty 15

## 2019-06-11 MED ORDER — BACLOFEN 10 MG PO TABS
20.0000 mg | ORAL_TABLET | Freq: Three times a day (TID) | ORAL | Status: DC
  Administered 2019-06-11 – 2019-06-18 (×21): 20 mg
  Filled 2019-06-11 (×23): qty 2

## 2019-06-11 MED ORDER — LORAZEPAM 2 MG/ML IJ SOLN
1.0000 mg | INTRAMUSCULAR | Status: DC | PRN
  Administered 2019-06-11 – 2019-06-13 (×4): 1 mg via INTRAVENOUS
  Filled 2019-06-11 (×4): qty 1

## 2019-06-11 MED ORDER — VITAL 1.5 CAL PO LIQD
1000.0000 mL | ORAL | Status: DC
  Administered 2019-06-11 – 2019-06-19 (×6): 1000 mL

## 2019-06-11 MED ORDER — ACETAMINOPHEN 325 MG PO TABS
650.0000 mg | ORAL_TABLET | ORAL | Status: DC | PRN
  Administered 2019-06-19 (×2): 650 mg
  Filled 2019-06-11 (×2): qty 2

## 2019-06-11 MED ORDER — VITAMIN B-1 100 MG PO TABS
100.0000 mg | ORAL_TABLET | Freq: Every day | ORAL | Status: DC
  Administered 2019-06-12 – 2019-06-18 (×7): 100 mg
  Filled 2019-06-11 (×7): qty 1

## 2019-06-11 MED ORDER — FAMOTIDINE 20 MG PO TABS
20.0000 mg | ORAL_TABLET | Freq: Two times a day (BID) | ORAL | Status: DC
  Administered 2019-06-11 – 2019-07-09 (×41): 20 mg
  Filled 2019-06-11 (×46): qty 1

## 2019-06-11 MED ORDER — ENOXAPARIN SODIUM 40 MG/0.4ML ~~LOC~~ SOLN
40.0000 mg | SUBCUTANEOUS | Status: DC
  Administered 2019-06-12 – 2019-07-08 (×26): 40 mg via SUBCUTANEOUS
  Filled 2019-06-11 (×27): qty 0.4

## 2019-06-11 MED ORDER — PRO-STAT SUGAR FREE PO LIQD
30.0000 mL | Freq: Three times a day (TID) | ORAL | Status: DC
  Administered 2019-06-11 – 2019-06-20 (×26): 30 mL

## 2019-06-11 MED ORDER — DIAZEPAM 5 MG PO TABS
5.0000 mg | ORAL_TABLET | Freq: Four times a day (QID) | ORAL | Status: DC
  Administered 2019-06-11 – 2019-06-12 (×4): 5 mg via ORAL
  Filled 2019-06-11 (×4): qty 1

## 2019-06-11 MED ORDER — ADULT MULTIVITAMIN LIQUID CH
15.0000 mL | Freq: Every day | ORAL | Status: DC
  Filled 2019-06-11: qty 15
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
## 2019-06-11 NOTE — Consult Note (Signed)
[facility] Consult   Reason for Consult:  Medications for schizoaffective disorder Referring Physician:  Dr Sabra Heck Patient Identification: Thomas [name] MRN:  [id number] Principal Diagnosis: Ischemia of extremity Diagnosis:  Principal Problem:   Ischemia of extremity Active Problems:   Schizoaffective disorder, bipolar type ([place])  Total Time spent with patient: 30 minutes  Subjective:   Thomas [name] is a 53 y.o. male patient admitted with orthopedic and vascular injury of the right ankle/foot.  Psychiatry consult was called due to patient displaying aggressive behavior to nursing staff and history of bipolar schizophrenia off medications.   06/11/19: Patient sedated in ICU, unable to conduct evaluation, will continue to reassess daily  06/10/19: Patient's behaviors continued to escalate and transferred to ICU.  His actions escalated higher and he was intubated.  Ammonia and other labs ordered.  This provider talked to his "wife" and his mother yesterday and they reported he has not had Suboxone in a "long time" but according to the [facility] he received 21 on 06/05/19 along with Adderall and Pregabalin.  According to the records, he is receiving it on a regular basis.  However, typical opioid withdrawal reactions mimic flu like symptoms with irritability but not agitation, especially to this level.  He was on Doxepin 200 mg at bedtime and this should be tapered off to prevent agitation.  The Ativan did not seem to help his agitation either.  Family reported he was managed at home on Seroquel and baclofen.  Previous notes state he was also on Zyprexa.  Dr Mallie Darting suggested restarting the Seroquel, Seroquel started back at 200 mg at bedtime vs 400 mg after he is extubated.  Later on 06/09/19: Patient's mother and his friend/wife [name] called for collateral information.  No recent alcohol use and no history of benzodiazepine use or abuse.  He takes Seroquel sporadically at home.   He does take Sonata, Lyrica, and Doxepin at bedtime.  Right now he is on pain medications and will not restart this along with Doxepin could increase his agitation.  Ativan alcohol detox protocol discontinued based on collateral information and Zyprexa 10 mg TID started, Seroquel discontinued to prevent oversedation.  06/09/19: Patient seen and evaluated in person by this provider.  Patient irritable on assessment, appears to be responding to internal stimuli at times as he stares at this provider and then off to the side.  Minimal verbal interaction on assessment.  Medication recommended placed yesterday by Dr Sheliah Mends.  Calm and no agitation notes placed at this time, agitation medications are in place.  Please avoid giving Ativan and Zyprexa together due to potential of dropping the pulse and BP too quickly.  HPI per psychiatrist: Patient is a 53 year old male presented to the emergency room with orthopedic and vascular injury of the right foot.  Last night shortly after presentation patient was displaying agitated and aggressive behaviors but due to the the need to operate on his foot was brought into surgery.  Overnight patient was abusive and aggressive with the nurses.   today patient had another operation and was seen in PACU shortly following procedure.  Due to sedation writer was unable to fully evaluate patient.  This was discussed with treatment team who asked for recommendations regarding acute agitation in case patient were to become belligerent again tonight. Team is unsure of substance abuse problems given pain out of proportion to exam and therefore he was placed on CIWA protocol. Chart reviewed and case discussed with treatment team.  Pharmacy  was called and medication history was reviewed.  More recently patient was prescribed Zyprexa 5 mg, doxepin unknown milligrams, and Adderall 20 mg twice daily.  Past Psychiatric History: History of schizoaffective bipolar type. per mother patient  has been noncompliant with his medications.  Risk to Self:  none Risk to Others:  none Prior Inpatient Therapy:  yes Prior Outpatient Therapy:  yes  Past Medical History:  Past Medical History:  Diagnosis Date  . Alcohol abuse   . Anemia   . Bipolar 1 disorder ([place])   . Cervical spine fracture ([place])   . Chronic, continuous use of opioids   . History of seizure   . HTN (hypertension)   . Insomnia   . MDD (major depressive disorder), recurrent, severe, with psychosis ([place])   . Pancreatitis     Past Surgical History:  Procedure Laterality Date  . LOWER EXTREMITY ANGIOGRAPHY Right 06/07/2019   Procedure: Lower Extremity Angiography;  Surgeon: Katha Cabal, MD;  Location: [facility];  Service: Cardiovascular;  Laterality: Right;  . ORIF ANKLE FRACTURE Right 06/08/2019   Procedure: OPEN REDUCTION INTERNAL FIXATION (ORIF) ANKLE FRACTURE;  Surgeon: Earnestine Leys, MD;  Location: [facility];  Service: Orthopedics;  Laterality: Right;   Family History: History reviewed. No pertinent family history. Family Psychiatric  History: none Social History:  Social History   Substance and Sexual Activity  Alcohol Use Yes   Comment: occasionally     Social History   Substance and Sexual Activity  Drug Use Not on file    Social History   Socioeconomic History  . Marital status: Single    Spouse name: Not on file  . Number of children: Not on file  . Years of education: Not on file  . Highest education level: Not on file  Occupational History  . Not on file  Social Needs  . Financial resource strain: Not on file  . Food insecurity    Worry: Not on file    Inability: Not on file  . Transportation needs    Medical: Not on file    Non-medical: Not on file  Tobacco Use  . Smoking status: Current Every Day Smoker  . Smokeless tobacco: Never Used  Substance and Sexual Activity  . Alcohol use: Yes    Comment: occasionally  . Drug use: Not on file  . Sexual activity:  Not on file  Lifestyle  . Physical activity    Days per week: Not on file    Minutes per session: Not on file  . Stress: Not on file  Relationships  . Social Herbalist on phone: Not on file    Gets together: Not on file    Attends religious service: Not on file    Active member of club or organization: Not on file    Attends meetings of clubs or organizations: Not on file    Relationship status: Not on file  Other Topics Concern  . Not on file  Social History Narrative  . Not on file   Additional Social History:  none    Allergies:   Allergies  Allergen Reactions  . Haldol [Haloperidol Lactate]     Labs:  Results for orders placed or performed during the hospital encounter of 06/07/19 (from the past 48 hour(s))  CBC     Status: Abnormal   Collection Time: 06/10/19  4:51 AM  Result Value Ref Range   WBC 6.9 4.0 - 10.5 K/uL   RBC  4.01 (L) 4.22 - 5.81 MIL/uL   Hemoglobin 13.7 13.0 - 17.0 g/dL   HCT 39.5 39.0 - 52.0 %   MCV 98.5 80.0 - 100.0 fL   MCH 34.2 (H) 26.0 - 34.0 pg   MCHC 34.7 30.0 - 36.0 g/dL   RDW 12.7 11.5 - 15.5 %   Platelets 131 (L) 150 - 400 K/uL   nRBC 0.0 0.0 - 0.2 %    Comment: Performed at [facility], [address] [place], [place] [address]  Comprehensive metabolic panel     Status: Abnormal   Collection Time: 06/10/19  4:51 AM  Result Value Ref Range   Sodium 139 135 - 145 mmol/L   Potassium 3.9 3.5 - 5.1 mmol/L   Chloride 110 98 - 111 mmol/L   CO2 23 22 - 32 mmol/L   Glucose, Bld 107 (H) 70 - 99 mg/dL   BUN 10 6 - 20 mg/dL   Creatinine, Ser 0.55 (L) 0.61 - 1.24 mg/dL   Calcium 8.2 (L) 8.9 - 10.3 mg/dL   Total Protein 6.2 (L) 6.5 - 8.1 g/dL   Albumin 3.3 (L) 3.5 - 5.0 g/dL   AST 16 15 - 41 U/L   ALT 13 0 - 44 U/L   Alkaline Phosphatase 77 38 - 126 U/L   Total Bilirubin 1.2 0.3 - 1.2 mg/dL   GFR calc non Af Amer >60 >60 mL/min   GFR calc Af Amer >60 >60 mL/min   Anion gap 6 5 - 15    Comment: Performed at  [facility], [address] [place], [place] [address]  Magnesium     Status: None   Collection Time: 06/10/19  4:51 AM  Result Value Ref Range   Magnesium 1.9 1.7 - 2.4 mg/dL    Comment: Performed at [facility], [address] [place], [place] [address]  Phosphorus     Status: None   Collection Time: 06/10/19  4:51 AM  Result Value Ref Range   Phosphorus 2.5 2.5 - 4.6 mg/dL    Comment: Performed at [facility], [address] [place], [place] [address]  Urine Drug Screen, Qualitative ([facility] only)     Status: Abnormal   Collection Time: 06/10/19 10:00 AM  Result Value Ref Range   Tricyclic, Ur Screen POSITIVE (A) NONE DETECTED   Amphetamines, Ur Screen NONE DETECTED NONE DETECTED   MDMA (Ecstasy)Ur Screen NONE DETECTED NONE DETECTED   Cocaine Metabolite,Ur [place] NONE DETECTED NONE DETECTED   Opiate, Ur Screen NONE DETECTED NONE DETECTED   Phencyclidine (PCP) Ur S NONE DETECTED NONE DETECTED   Cannabinoid 50 Ng, Ur [place] POSITIVE (A) NONE DETECTED   Barbiturates, Ur Screen NONE DETECTED NONE DETECTED   Benzodiazepine, Ur Scrn POSITIVE (A) NONE DETECTED   Methadone Scn, Ur NONE DETECTED NONE DETECTED    Comment: (NOTE) Tricyclics + metabolites, urine    Cutoff 1000 ng/mL Amphetamines + metabolites, urine  Cutoff 1000 ng/mL MDMA (Ecstasy), urine              Cutoff 500 ng/mL Cocaine Metabolite, urine          Cutoff 300 ng/mL Opiate + metabolites, urine        Cutoff 300 ng/mL Phencyclidine (PCP), urine         Cutoff 25 ng/mL Cannabinoid, urine                 Cutoff 50 ng/mL Barbiturates + metabolites, urine  Cutoff 200 ng/mL Benzodiazepine,  urine              Cutoff 200 ng/mL Methadone, urine                   Cutoff 300 ng/mL The urine drug screen provides only a preliminary, unconfirmed analytical test result and should not be used for non-medical purposes. Clinical consideration and professional judgment should be applied to any positive drug  screen result due to possible interfering substances. A more specific alternate chemical method must be used in order to obtain a confirmed analytical result. Gas chromatography / mass spectrometry (GC/MS) is the preferred confirmat ory method. Performed at [facility], [address] [place], [place] [address]   Urinalysis, Complete w Microscopic     Status: Abnormal   Collection Time: 06/10/19 12:53 PM  Result Value Ref Range   Color, Urine YELLOW (A) YELLOW   APPearance CLEAR (A) CLEAR   Specific Gravity, Urine 1.010 1.005 - 1.030   pH 7.0 5.0 - 8.0   Glucose, UA NEGATIVE NEGATIVE mg/dL   Hgb urine dipstick NEGATIVE NEGATIVE   Bilirubin Urine NEGATIVE NEGATIVE   Ketones, ur 20 (A) NEGATIVE mg/dL   Protein, ur NEGATIVE NEGATIVE mg/dL   Nitrite NEGATIVE NEGATIVE   Leukocytes,Ua NEGATIVE NEGATIVE   RBC / HPF 0-5 0 - 5 RBC/hpf   WBC, UA 0-5 0 - 5 WBC/hpf   Bacteria, UA NONE SEEN NONE SEEN   Squamous Epithelial / LPF NONE SEEN 0 - 5   Mucus PRESENT     Comment: Performed at [facility], [address] [place], [place] [address]  Triglycerides     Status: Abnormal   Collection Time: 06/10/19  1:07 PM  Result Value Ref Range   Triglycerides 497 (H) <150 mg/dL    Comment: Performed at [facility], [address] [place], [place] [address]  Ammonia     Status: Abnormal   Collection Time: 06/10/19  1:07 PM  Result Value Ref Range   Ammonia 37 (H) 9 - 35 umol/L    Comment: Performed at [facility], [address] [place], [place] [address]  Blood gas, arterial     Status: Abnormal   Collection Time: 06/10/19  1:45 PM  Result Value Ref Range   FIO2 0.40    Delivery systems VENTILATOR    VT 450 mL   LHR 16 resp/min   Peep/cpap 5.0 cm H20   pH, Arterial 7.31 (L) 7.350 - 7.450   pCO2 arterial 46 32.0 - 48.0 mmHg   pO2, Arterial 143 (H) 83.0 - 108.0 mmHg   Bicarbonate 23.2 20.0 - 28.0 mmol/L   Acid-base deficit 3.3 (H) 0.0 - 2.0  mmol/L   O2 Saturation 99.0 %   Patient temperature 37.0    Collection site LEFT RADIAL    Sample type ARTHROGRAPHIS SPECIES    Allens test (pass/fail) POSITIVE (A) PASS    Comment: Performed at [facility], [address] [place], [place] [address]  CBC     Status: Abnormal   Collection Time: 06/11/19  4:43 AM  Result Value Ref Range   WBC 5.1 4.0 - 10.5 K/uL   RBC 3.84 (L) 4.22 - 5.81 MIL/uL   Hemoglobin 13.0 13.0 - 17.0 g/dL   HCT 38.9 (L) 39.0 - 52.0 %   MCV 101.3 (H) 80.0 - 100.0 fL   MCH 33.9 26.0 - 34.0 pg   MCHC 33.4 30.0 - 36.0 g/dL   RDW 12.9 11.5 -  15.5 %   Platelets 127 (L) 150 - 400 K/uL   nRBC 0.0 0.0 - 0.2 %    Comment: Performed at [facility], [address] [place], [place] [address]  Comprehensive metabolic panel     Status: Abnormal   Collection Time: 06/11/19  4:43 AM  Result Value Ref Range   Sodium 139 135 - 145 mmol/L   Potassium 4.4 3.5 - 5.1 mmol/L   Chloride 109 98 - 111 mmol/L   CO2 22 22 - 32 mmol/L   Glucose, Bld 124 (H) 70 - 99 mg/dL   BUN 17 6 - 20 mg/dL   Creatinine, Ser 0.70 0.61 - 1.24 mg/dL   Calcium 8.3 (L) 8.9 - 10.3 mg/dL   Total Protein 5.9 (L) 6.5 - 8.1 g/dL   Albumin 3.3 (L) 3.5 - 5.0 g/dL   AST 19 15 - 41 U/L   ALT 14 0 - 44 U/L   Alkaline Phosphatase 72 38 - 126 U/L   Total Bilirubin 0.9 0.3 - 1.2 mg/dL   GFR calc non Af Amer >60 >60 mL/min   GFR calc Af Amer >60 >60 mL/min   Anion gap 8 5 - 15    Comment: Performed at [facility], [address], [place], [place] [address]  Magnesium     Status: None   Collection Time: 06/11/19  4:43 AM  Result Value Ref Range   Magnesium 1.7 1.7 - 2.4 mg/dL    Comment: Performed at [facility], [address] [place], [place] [address]  Phosphorus     Status: None   Collection Time: 06/11/19  4:43 AM  Result Value Ref Range   Phosphorus 4.3 2.5 - 4.6 mg/dL    Comment: Performed at [facility], [place], [place]  [address]  Troponin I (High Sensitivity)     Status: Abnormal   Collection Time: 06/11/19  9:59 AM  Result Value Ref Range   Troponin I (High Sensitivity) 289 (HH) <18 ng/L    Comment: CRITICAL RESULT CALLED TO, READ BACK BY AND VERIFIED WITH LESLIE MITTS AT 1103 ON 06/11/19 JJB (NOTE) Elevated high sensitivity troponin I (hsTnI) values and significant  changes across serial measurements may suggest ACS but many other  chronic and acute conditions are known to elevate hsTnI results.  Refer to the "Links" section for chest pain algorithms and additional  guidance. Performed at [facility], [address] [place], [place] [address]   CK     Status: None   Collection Time: 06/11/19 10:22 AM  Result Value Ref Range   Total CK 153 49 - 397 U/L    Comment: Performed at [facility], [address] [place], [place] [address]  Lactic acid, plasma     Status: None   Collection Time: 06/11/19 10:22 AM  Result Value Ref Range   Lactic Acid, Venous 0.9 0.5 - 1.9 mmol/L    Comment: Performed at [facility], [address] [place], [place] [address]  Blood gas, arterial     Status: Abnormal   Collection Time: 06/11/19 11:53 AM  Result Value Ref Range   FIO2 0.28    Delivery systems VENTILATOR    Mode PRESSURE REGULATED VOLUME CONTROL    VT 450 mL   LHR 16 resp/min   Peep/cpap 5.0 cm H20   pH, Arterial 7.30 (L) 7.350 - 7.450   pCO2 arterial 42 32.0 - 48.0 mmHg   pO2, Arterial 48 (  L) 83.0 - 108.0 mmHg   Bicarbonate 20.7 20.0 - 28.0 mmol/L   Acid-base deficit 5.5 (H) 0.0 - 2.0 mmol/L   O2 Saturation 78.6 %   Patient temperature 37.0    Collection site LEFT BRACHIAL    Sample type VENOUS     Comment: Performed at [facility], [address] [place], [place] [address]  Lactic acid, plasma     Status: None   Collection Time: 06/11/19 12:22 PM  Result Value Ref Range   Lactic Acid, Venous 1.1 0.5 - 1.9 mmol/L    Comment: Performed at [facility], [place], [place] [address]  Troponin I (High Sensitivity)     Status: Abnormal   Collection Time: 06/11/19 12:22 PM  Result Value Ref Range   Troponin I (High Sensitivity) 249 (HH) <18 ng/L    Comment: CRITICAL VALUE NOTED. VALUE IS CONSISTENT WITH PREVIOUSLY REPORTED/CALLED VALUE JJB (NOTE) Elevated high sensitivity troponin I (hsTnI) values and significant  changes across serial measurements may suggest ACS but many other  chronic and acute conditions are known to elevate hsTnI results.  Refer to the "Links" section for chest pain algorithms and additional  guidance. Performed at [facility], [address], [place], [place] [address]     Current Facility-Administered Medications  Medication Dose Route Frequency Provider Last Rate Last Dose  . acetaminophen (TYLENOL) tablet 650 mg  650 mg Per Tube Q4H PRN Flora Lipps, MD      . baclofen (LIORESAL) tablet 20 mg  20 mg Per Tube TID Flora Lipps, MD   20 mg at 06/11/19 1704  . chlorhexidine gluconate (MEDLINE KIT) (PERIDEX) 0.12 % solution 15 mL  15 mL Mouth Rinse BID Ottie Glazier, MD   15 mL at 06/11/19 0823  . Chlorhexidine Gluconate Cloth 2 % PADS 6 each  6 each Topical Daily Ottie Glazier, MD   6 each at 06/11/19 1052  . [START ON 06/12/2019] clopidogrel (PLAVIX) tablet 75 mg  75 mg Per Tube Daily Kasa, Kurian, MD      . dexmedetomidine (PRECEDEX) 400 MCG/100ML (4 mcg/mL) infusion  0.4-1.2 mcg/kg/hr Intravenous Titrated Ottie Glazier, MD   Stopped at 06/11/19 1417  . diazepam (VALIUM) tablet 5 mg  5 mg Oral Q6H Flora Lipps, MD   5 mg at 06/11/19 1218  . doxepin (SINEQUAN) capsule 150 mg  150 mg Oral QHS Patrecia Pour, NP   150 mg at 06/10/19 2125  . [START ON 06/12/2019] enoxaparin (LOVENOX) injection 40 mg  40 mg Subcutaneous Q24H Kasa, Kurian, MD      . famotidine (PEPCID) tablet 20 mg  20 mg Per Tube BID Flora Lipps, MD   20 mg at 06/11/19 1218  . feeding supplement (PRO-STAT  SUGAR FREE 64) liquid 30 mL  30 mL Per Tube TID Flora Lipps, MD   30 mL at 06/11/19 1703  . feeding supplement (VITAL 1.5 CAL) liquid 1,000 mL  1,000 mL Per Tube Continuous Flora Lipps, MD 45 mL/hr at 06/11/19 1405 1,000 mL at 06/11/19 1405  . [START ON 03/15/5008] folic acid (FOLVITE) tablet 1 mg  1 mg Per Tube Daily Kasa, Kurian, MD      . HYDROmorphone (DILAUDID) 50 mg in sodium chloride 0.9 % 100 mL (0.5 mg/mL) infusion  0.5-4 mg/hr Intravenous Continuous Flora Lipps, MD   Stopped at 06/11/19 1417  . HYDROmorphone (DILAUDID) bolus via infusion 0.2 mg  0.2 mg Intravenous Q30 min PRN Flora Lipps, MD      .  HYDROmorphone (DILAUDID) injection 0.5 mg  0.5 mg Intravenous Once Flora Lipps, MD      . influenza vac split quadrivalent PF (FLUARIX) injection 0.5 mL  0.5 mL Intramuscular Tomorrow-1000 Earnestine Leys, MD      . LORazepam (ATIVAN) injection 1 mg  1 mg Intravenous Q1H PRN Flora Lipps, MD      . MEDLINE mouth rinse  15 mL Mouth Rinse 10 times per day Ottie Glazier, MD   15 mL at 06/11/19 1741  . [START ON 06/12/2019] multivitamin liquid 15 mL  15 mL Per Tube Daily Flora Lipps, MD      . phenylephrine (NEO-SYNEPHRINE) 10 mg in sodium chloride 0.9 % 250 mL (0.04 mg/mL) infusion  0-400 mcg/min Intravenous Titrated Flora Lipps, MD 30 mL/hr at 06/11/19 1736 20 mcg/min at 06/11/19 1736  . QUEtiapine (SEROQUEL) tablet 200 mg  200 mg Per Tube QHS Flora Lipps, MD      . senna-docusate (Senokot-S) tablet 2 tablet  2 tablet Oral BID Benita Gutter, RPH      . sodium chloride flush (NS) 0.9 % injection 3 mL  3 mL Intravenous PRN Earnestine Leys, MD      . Derrill Memo ON 06/12/2019] thiamine (VITAMIN B-1) tablet 100 mg  100 mg Per Tube Daily Flora Lipps, MD        Musculoskeletal: Strength & Muscle Tone: decreased Gait & Station: did not witness Patient leans: N/A  Psychiatric Specialty Exam: Physical Exam  Nursing note and vitals reviewed. Constitutional: He appears well-developed and  well-nourished.  HENT:  Head: Normocephalic.  Respiratory: Effort normal.  Psychiatric: His affect is blunt. He is slowed. Cognition and memory are impaired. He expresses impulsivity and inappropriate judgment.    Review of Systems  All other systems reviewed and are negative.   Blood pressure (!) 76/57, pulse (!) 58, temperature 97.9 F (36.6 C), temperature source Oral, resp. rate 19, height 6' 0.01" (1.829 m), weight 76.6 kg, SpO2 100 %.Body mass index is 22.9 kg/m.  General Appearance: Casual  Eye Contact:  intubated  Speech:  intubated  Volume:  intubated  Mood:  intubated  Affect:  Blunt  Thought Process:  Unable to assess  Orientation:  Unable to assess  Thought Content:  Unable to assess  Suicidal Thoughts:  Unable to assess  Homicidal Thoughts:  Unable to assess  Memory:  Unable to assess  Judgement:  Impaired  Insight:  Lacking  Psychomotor Activity:  Decreased  Concentration:  Unable to assess  Recall:  Intubated  Fund of Knowledge:  Intubated  Language:  None  Akathisia:  No  Handed:  Right  AIMS (if indicated):     Assets:  Housing Leisure Time Resilience Social Support  ADL's:  Impaired  Cognition:  Impaired, severe at this time d/t intubation  Sleep:        Treatment Plan Summary: Daily contact with patient to assess and evaluate symptoms and progress in treatment, Medication management and Plan schizoaffective disorder, bipolar type:  -Restart Seroquel once he is extubated at 200 mg at bedtime -Continue Zyprexa 10 mg TID   Insomnia: -Decrease Doxepin from 200 mg (home dose) to 150 mg once he is extubated  Disposition: Supportive therapy provided about ongoing stressors.  Will continue to reassess  Dixie Dials, MD 06/11/2019 6:35 PM   Case discussed and plan agreed upon as outlined above with Dr. Reita Cliche

## 2019-06-11 NOTE — Progress Notes (Signed)
Pt has been NSR, then Sinus Loletha Grayer with an episode of st depression. MD notified and appropriate orders completed. Pt has had normal unlabored respirations with a pox of 100% throughout shift. Pt is currently off all sedation due to a change in mental status to unresponsive during shift. MD notified and continuing to assess. Urine output has been good. Tube feedings were initiated at a rate of 68ml/hr.  Pulses are adequate, doppler does have to be used on R and L dorsalis pedalis. Pts BP started running low so a neo synephrine drip has been started and titrated accordingly. MAP has continued to be slightly above 65.

## 2019-06-11 NOTE — Consult Note (Signed)
PHARMACY CONSULT NOTE - FOLLOW UP  Pharmacy Consult for Electrolyte Monitoring and Replacement   53 y/o M admitted with right lower extremity ischemia and right ankle fracture now s/p peripheral revascularization and ORIF. Patient has a history significant for schizophrenia and substance abuse. Post-operatively he became extremely agitated likely secondary to drug/alcohol withdrawal requiring high doses of sedatives. He is currently intubated and sedated in the ICU.   Recent Labs: Potassium (mmol/L)  Date Value  06/11/2019 4.4   Magnesium (mg/dL)  Date Value  06/11/2019 1.7   Calcium (mg/dL)  Date Value  06/11/2019 8.3 (L)   Albumin (g/dL)  Date Value  06/11/2019 3.3 (L)   Phosphorus (mg/dL)  Date Value  06/11/2019 4.3   Sodium (mmol/L)  Date Value  06/11/2019 139    Electrolytes: Patient has a history of alcohol abuse. Baseline nutritional status un-clear. Electrolytes have been within normal limits this admission. Starting tube feeds today. Magnesium at the lower end of normal. Goal of therapy to replete electrolytes to normal levels.     -IV magnesium 2 g x1    -BMP and Mg tomorrow AM  Constipation: Last bowel movement prior to admission. Constipating medications include hydromorphone and doxepin. Patient is currently on scheduled docusate 100 mg BID.    -Discontinue docusate 100 mg BID    -Start senna-docusate 2 tablets BID  Glucose: No baseline A1c in chart and no history of diabetes recorded. Tube feeds starting today. Received one dose of methylprednisolone 125 mg on 9/27. Medications that could contribute to metabolic development of new-onset diabetes include quetiapine. Glucoses have been normal with a few mild hyperglycemic readings which may be associated with steroids.     -Continue to monitor for hyper/hypoglycemia   [place] Resident 06/11/2019 10:58 AM

## 2019-06-11 NOTE — Progress Notes (Signed)
Subjective: 3 Days Post-Op Procedure(s) (LRB): OPEN REDUCTION INTERNAL FIXATION (ORIF) ANKLE FRACTURE (Right)   Patient remains intubated and sedated.  No pain response to moving leg.  Labs normal.   Objective:   VITALS:   Vitals:   06/11/19 0800 06/11/19 1200  BP: 119/90   Pulse: 62   Resp:    Temp: 98.2 F (36.8 C) 97.9 F (36.6 C)  SpO2: 100%     ABD soft Neurovascular intact Incision: dressing C/D/I Right foot very warm. No distal swelling.   LABS Recent Labs    06/09/19 0411 06/10/19 0451 06/11/19 0443  HGB 13.5 13.7 13.0  HCT 40.6 39.5 38.9*  WBC 6.4 6.9 5.1  PLT 126* 131* 127*    Recent Labs    06/09/19 0411 06/10/19 0451 06/11/19 0443  NA 140 139 139  K 4.1 3.9 4.4  BUN 8 10 17   CREATININE 0.65 0.55* 0.70  GLUCOSE 99 107* 124*    No results for input(s): LABPT, INR in the last 72 hours.   Assessment/Plan: 3 Days Post-Op Procedure(s) (LRB): OPEN REDUCTION INTERNAL FIXATION (ORIF) ANKLE FRACTURE (Right)   Continue elevation of leg.  Will apply cast soon Start PT when capable.  NWB right leg

## 2019-06-11 NOTE — Progress Notes (Addendum)
Initial Nutrition Assessment  DOCUMENTATION CODES:   Not applicable  INTERVENTION:   Initiate Vital 1.5 @45ml /hr + Prostat 27ml TID  Free water flushes 58ml q4 hours to maintain tube patency   Regimen provides 1920kcal/day, 118g/day protein, 1038ml/day free water   MVI, thiamine and folic acid daily in setting of etoh abuse  Pt at high refeed risk; recommend monitor K, Mg and P labs daily  NUTRITION DIAGNOSIS:   Inadequate oral intake related to inability to eat(pt sedated and ventilated) as evidenced by NPO status.  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 Pt now s/p vascular repair 9/24 and ORIF 9/25. Pt devloped withdrawl symptoms and agitation on 9/27 requiring intubation   Pt sedated and ventilated. OGT in place. RD suspects poor appetite and oral intake at baseline r/t etoh abuse. Plan is to initiate tube feeds today. Pt is at high refeed risk. Per chart, pt is weight stable pta.   Pt is at high risk for developing malnutrition.   Medications reviewed and include: plavix, colace, lovenox, folic acid, MVI, nicotine, thiamine, NaCl @75ml /hr, precedex, ketamine   Labs reviewed: K 4.4 wnl, P 4.3 wnl, Mg 1.7 wnl Ammonia 37(H)- 9/27 Triglycerides 497(H)- 9/27  Patient is currently intubated on ventilator support MV: 8.9 L/min Temp (24hrs), Avg:99 F (37.2 C), Min:98.2 F (36.8 C), Max:99.5 F (37.5 C)  Propofol: none  MAP- >31mmHg  Unable to complete Nutrition-Focused physical exam at this time.   Diet Order:   Diet  Order    None     EDUCATION NEEDS:   Not appropriate for education at this time  Skin:  Skin Assessment: Reviewed RN Assessment(ecchymosis, incision R ankle)  Last BM:  pta  Height:   Ht Readings from Last 1 Encounters:  06/07/19 6' 0.01" (1.829 m)    Weight:   Wt Readings from Last 1 Encounters:  06/07/19 76.6 kg    Ideal Body Weight:  80.9 kg  BMI:  Body mass index is 22.9 kg/m.  Estimated Nutritional Needs:   Kcal:  1915kcal/day  Protein:  115-130g/day  Fluid:  2.3L/day  Koleen Distance MS, RD, LDN Pager #- [phone] Office#- [phone] After Hours Pager: [phone]

## 2019-06-11 NOTE — Progress Notes (Signed)
Pt began to arouse and respond to verbal stimuli at approximately 2100 then quickly became agitated and attempted to remove ETT tube. While attempting to remove ETT tube, pt was successful in removing NG tube. Due to severe agitation and aggressiveness bilatertal wrist restraints were applied. Sedation medications resumed. Pt now resting calmly with no signs of distress. Will continue to monitor.

## 2019-06-11 NOTE — Progress Notes (Signed)
[facility] Daily Progress Note   Subjective: 3 Days Post-Op: 1. Introduction catheter intorightlower extremity 3rd order catheter placement  2.Contrast injectionrightlower extremity for distal runoff  3. Percutaneous transluminal angioplasty and stent placementrightsuperficial femoral artery  4. Percutaneous transluminal angioplasty and stent placement right external iliac artery 5.Star close closureleftcommon femoral arteriotomy  Intubated and sedated.   Objective: Vitals:   06/11/19 0400 06/11/19 0500 06/11/19 0600 06/11/19 0800  BP: 106/82 108/83 109/82 119/90  Pulse: 65 64 64 62  Resp:      Temp: 98.9 F (37.2 C)   98.2 F (36.8 C)  TempSrc: Axillary   Oral  SpO2: 100% 100% 100% 100%  Weight:      Height:        Intake/Output Summary (Last 24 hours) at 06/11/2019 1040 Last data filed at 06/11/2019 0800 Gross per 24 hour  Intake 2971.4 ml  Output 2670 ml  Net 301.4 ml   Physical Exam: Intubated and sedated. NAD CV: RRR Pulmonary: CTA Bilaterally Abdomen: Soft, Nontender, Nondistended Vascular:  Right Lower Extremity: Thigh soft, Calf soft. Unable to access pedal pulses due to cast however toes are warm with a good capillary refill.    Laboratory: CBC    Component Value Date/Time   WBC 5.1 06/11/2019 0443   HGB 13.0 06/11/2019 0443   HCT 38.9 (L) 06/11/2019 0443   PLT 127 (L) 06/11/2019 0443   BMET    Component Value Date/Time   NA 139 06/11/2019 0443   K 4.4 06/11/2019 0443   CL 109 06/11/2019 0443   CO2 22 06/11/2019 0443   GLUCOSE 124 (H) 06/11/2019 0443   BUN 17 06/11/2019 0443   CREATININE 0.70 06/11/2019 0443   CALCIUM 8.3 (L) 06/11/2019 0443   GFRNONAA >60 06/11/2019 0443   GFRAA >60 06/11/2019 0443   Assessment/Planning: The patient is a 53 year old male who presented with right ankle fracture and right lower extremity ischemia 1) Appreciate  assistance in management from ICU and psychiatry  2) Successful right lower extremity revascularization - no further recommendation from vascular at this time. Will continue to monitor  Discussed with Dr. Eber Hong [facility] PA-C 06/11/2019 10:40 AM

## 2019-06-12 ENCOUNTER — Inpatient Hospital Stay: Payer: Self-pay

## 2019-06-12 ENCOUNTER — Inpatient Hospital Stay: Payer: Medicaid Other

## 2019-06-12 DIAGNOSIS — J9600 Acute respiratory failure, unspecified whether with hypoxia or hypercapnia: Secondary | ICD-10-CM | POA: Diagnosis present

## 2019-06-12 LAB — CBC WITH DIFFERENTIAL/PLATELET
Abs Immature Granulocytes: 0.02 10*3/uL (ref 0.00–0.07)
Basophils Absolute: 0 10*3/uL (ref 0.0–0.1)
Basophils Relative: 0 %
Eosinophils Absolute: 0.1 10*3/uL (ref 0.0–0.5)
Eosinophils Relative: 1 %
HCT: 39.5 % (ref 39.0–52.0)
Hemoglobin: 12.6 g/dL — ABNORMAL LOW (ref 13.0–17.0)
Immature Granulocytes: 0 %
Lymphocytes Relative: 30 %
Lymphs Abs: 2.1 10*3/uL (ref 0.7–4.0)
MCH: 33.5 pg (ref 26.0–34.0)
MCHC: 31.9 g/dL (ref 30.0–36.0)
MCV: 105.1 fL — ABNORMAL HIGH (ref 80.0–100.0)
Monocytes Absolute: 0.8 10*3/uL (ref 0.1–1.0)
Monocytes Relative: 11 %
Neutro Abs: 4 10*3/uL (ref 1.7–7.7)
Neutrophils Relative %: 58 %
Platelets: 157 10*3/uL (ref 150–400)
RBC: 3.76 MIL/uL — ABNORMAL LOW (ref 4.22–5.81)
RDW: 13.8 % (ref 11.5–15.5)
WBC: 7 10*3/uL (ref 4.0–10.5)
nRBC: 0 % (ref 0.0–0.2)

## 2019-06-12 LAB — BASIC METABOLIC PANEL
Anion gap: 7 (ref 5–15)
BUN: 24 mg/dL — ABNORMAL HIGH (ref 6–20)
CO2: 23 mmol/L (ref 22–32)
Calcium: 8.6 mg/dL — ABNORMAL LOW (ref 8.9–10.3)
Chloride: 112 mmol/L — ABNORMAL HIGH (ref 98–111)
Creatinine, Ser: 0.71 mg/dL (ref 0.61–1.24)
GFR calc Af Amer: >60 mL/min (ref >60)
GFR calc non Af Amer: >60 mL/min (ref >60)
Glucose, Bld: 117 mg/dL — ABNORMAL HIGH (ref 70–99)
Potassium: 4.2 mmol/L (ref 3.5–5.1)
Sodium: 142 mmol/L (ref 135–145)

## 2019-06-12 LAB — MAGNESIUM: Magnesium: 2 mg/dL (ref 1.7–2.4)

## 2019-06-12 MED ORDER — BISACODYL 10 MG RE SUPP
10.0000 mg | Freq: Every day | RECTAL | Status: DC | PRN
  Administered 2019-06-12: 10 mg via RECTAL
  Filled 2019-06-12: qty 1

## 2019-06-12 MED ORDER — ASPIRIN 81 MG PO CHEW
81.0000 mg | CHEWABLE_TABLET | Freq: Every day | ORAL | Status: DC
  Administered 2019-06-12 – 2019-07-09 (×19): 81 mg via ORAL
  Filled 2019-06-12 (×22): qty 1

## 2019-06-12 MED ORDER — SODIUM CHLORIDE 0.9% FLUSH
10.0000 mL | Freq: Two times a day (BID) | INTRAVENOUS | Status: DC
  Administered 2019-06-12 – 2019-06-13 (×2): 10 mL
  Administered 2019-06-13 – 2019-06-14 (×2): 20 mL
  Administered 2019-06-14 – 2019-06-18 (×7): 10 mL
  Administered 2019-06-18: 30 mL
  Administered 2019-06-19 – 2019-06-22 (×7): 10 mL
  Administered 2019-06-23: 20 mL
  Administered 2019-06-24 – 2019-07-07 (×19): 10 mL
  Administered 2019-07-08: 12:00:00 20 mL
  Administered 2019-07-08 – 2019-07-09 (×2): 10 mL

## 2019-06-12 MED ORDER — LACTATED RINGERS IV BOLUS
500.0000 mL | Freq: Once | INTRAVENOUS | Status: AC
  Administered 2019-06-12: 22:00:00 500 mL via INTRAVENOUS

## 2019-06-12 MED ORDER — SODIUM CHLORIDE 0.9% FLUSH
10.0000 mL | INTRAVENOUS | Status: DC | PRN
  Administered 2019-06-17 – 2019-06-27 (×2): 10 mL
  Filled 2019-06-12 (×2): qty 40

## 2019-06-12 MED ORDER — VECURONIUM BROMIDE 10 MG IV SOLR
10.0000 mg | Freq: Once | INTRAVENOUS | Status: AC
  Administered 2019-06-12: 10 mg via INTRAVENOUS

## 2019-06-12 MED ORDER — ASPIRIN 81 MG PO CHEW: 81.0000 mg | CHEWABLE_TABLET | Freq: Every day | ORAL | Status: DC

## 2019-06-12 MED ORDER — MIDAZOLAM 50MG/50ML (1MG/ML) PREMIX INFUSION
0.5000 mg/h | INTRAVENOUS | Status: DC
  Administered 2019-06-12: 20:00:00 0.5 mg/h via INTRAVENOUS
  Administered 2019-06-13: 6 mg/h via INTRAVENOUS
  Filled 2019-06-12 (×4): qty 50

## 2019-06-12 MED ORDER — STERILE WATER FOR INJECTION IJ SOLN
INTRAMUSCULAR | Status: AC
  Administered 2019-06-12: 18:00:00
  Filled 2019-06-12: qty 10

## 2019-06-12 MED ORDER — VECURONIUM BROMIDE 10 MG IV SOLR
INTRAVENOUS | Status: AC
  Administered 2019-06-12: 18:00:00 10 mg
  Filled 2019-06-12: qty 10

## 2019-06-12 MED ORDER — LORAZEPAM 2 MG/ML IJ SOLN
4.0000 mg | Freq: Once | INTRAMUSCULAR | Status: AC
  Administered 2019-06-12: 15:00:00 4 mg via INTRAVENOUS

## 2019-06-12 MED ORDER — DEXMEDETOMIDINE BOLUS VIA INFUSION
1.0000 ug/kg | Freq: Once | INTRAVENOUS | Status: AC
  Administered 2019-06-12: 15:00:00 76.6 ug via INTRAVENOUS
  Filled 2019-06-12: qty 77

## 2019-06-12 MED ORDER — LORAZEPAM 2 MG/ML IJ SOLN
INTRAMUSCULAR | Status: AC
  Administered 2019-06-12: 18:00:00 2 mg
  Filled 2019-06-12: qty 2

## 2019-06-12 MED ORDER — ASPIRIN EC 81 MG PO TBEC: 81.0000 mg | DELAYED_RELEASE_TABLET | Freq: Every day | ORAL | Status: DC

## 2019-06-12 MED ORDER — HYDROMORPHONE BOLUS VIA INFUSION
2.0000 mg | Freq: Once | INTRAVENOUS | Status: AC
  Administered 2019-06-12: 2 mg via INTRAVENOUS
  Filled 2019-06-12: qty 2

## 2019-06-12 NOTE — Progress Notes (Signed)
Peripherally Inserted Central Catheter/Midline Placement  The IV Nurse has discussed with the patient and/or persons authorized to consent for the patient, the purpose of this procedure and the potential benefits and risks involved with this procedure.  The benefits include less needle sticks, lab draws from the catheter, and the patient may be discharged home with the catheter. Risks include, but not limited to, infection, bleeding, blood clot (thrombus formation), and puncture of an artery; nerve damage and irregular heartbeat and possibility to perform a PICC exchange if needed/ordered by physician.  Alternatives to this procedure were also discussed.  Bard Power PICC patient education guide, fact sheet on infection prevention and patient information card has been provided to patient /or left at bedside.    PICC/Midline Placement Documentation  PICC Double Lumen 06/12/19 PICC Right Brachial 44 cm 2 cm (Active)  Indication for Insertion or Continuance of Line Vasoactive infusions 06/12/19 1530  Exposed Catheter (cm) 2 cm 06/12/19 1530  Site Assessment Clean;Dry;Intact 06/12/19 1530  Lumen #1 Status Flushed;Saline locked;Blood return noted 06/12/19 1530  Lumen #2 Status Flushed;Saline locked;Blood return noted 06/12/19 1530  Dressing Type Transparent 06/12/19 1530  Dressing Status Clean;Dry;Intact;Antimicrobial disc in place 06/12/19 1530  Dressing Change Due 06/19/19 06/12/19 1530       Gordan Payment 06/12/2019, 3:31 PM

## 2019-06-12 NOTE — Progress Notes (Signed)
Pt was resting comfortably on neosynephrine, dilaudid and precedex. His mother came in to visit and when she started speaking with pt he became agitated and combative. After being unsuccessful in calming him down with myself and the charge nurse.  We called a code 300 and assistance arrived. Pt disconnected his vent had bloody secretions were coming out of vent. After consulting with Dr. Mortimer Fries, increased precedex to 0.7, administered a dilaudid 0.2mg   bolus via infusion, increased precedex to 1.2, administered ativan 1mg  prn , increased dilaudid to 4mg  infusion, administered a 4 mg ativan once, administered a 2mg  dilaudid bolus once and a 10 vecuronium once.Marland Kitchen PWere able to calm pt down with ordered meds. Pts mother was comforted and updated. and pt is now resting comfortably and is having a picc line inserted.

## 2019-06-12 NOTE — Progress Notes (Signed)
 [facility] Daily Progress Note   Subjective: 4 Days Post-Op: 1. Introduction catheter intorightlower extremity 3rd order catheter placement  2.Contrast injectionrightlower extremity for distal runoff  3. Percutaneous transluminal angioplasty and stent placementrightsuperficial femoral artery  4. Percutaneous transluminal angioplasty and stent placement right external iliac artery 5.Star close closureleftcommon femoral arteriotomy  Patient sedated and intubated. Pulled NJ out yesterday after decrease in sedation now in restraints.   Objective: Vitals:   06/12/19 0900 06/12/19 1000 06/12/19 1100 06/12/19 1200  BP: 117/89 119/86 105/80 102/79  Pulse: (!) 57 63 (!) 59 (!) 57  Resp:      Temp:      TempSrc:      SpO2: 100% 100% 100% 100%  Weight:      Height:        Intake/Output Summary (Last 24 hours) at 06/12/2019 1346 Last data filed at 06/12/2019 0700 Gross per 24 hour  Intake 1189.43 ml  Output 950 ml  Net 239.43 ml   Physical Exam: Intubated and sedated. NAD CV: RRR Pulmonary: CTA Bilaterally Abdomen: Soft, Nontender, Nondistended Vascular:             Right Lower Extremity: Thigh soft, Calf soft. Unable to access pedal pulses due to cast however toes are warm with a good capillary refill.    Laboratory: CBC    Component Value Date/Time   WBC 7.0 06/12/2019 0523   HGB 12.6 (L) 06/12/2019 0523   HCT 39.5 06/12/2019 0523   PLT 157 06/12/2019 0523   BMET    Component Value Date/Time   NA 142 06/12/2019 0523   K 4.2 06/12/2019 0523   CL 112 (H) 06/12/2019 0523   CO2 23 06/12/2019 0523   GLUCOSE 117 (H) 06/12/2019 0523   BUN 24 (H) 06/12/2019 0523   CREATININE 0.71 06/12/2019 0523   CALCIUM 8.6 (L) 06/12/2019 0523   GFRNONAA >60 06/12/2019 0523   GFRAA >60 06/12/2019 0523   Assessment/Planning: The patient is a 53 year old male who presented with right ankle  fracture and right lower extremity ischemia 1) Appreciate assistance in management from ICU and psychiatry  2) Successful right lower extremity revascularization - no further recommendation from vascular at this time. Will continue to monitor 3) Consulting medicine to assist with medical issues once out of unit.   Marcelle Overlie PA-C 06/12/2019 1:46 PM

## 2019-06-12 NOTE — Consult Note (Addendum)
[facility] NAME: Thomas [name]    MR#:  [id number]  DATE OF BIRTH:  1965-11-27  DATE OF ADMISSION:  06/07/2019  PRIMARY CARE PHYSICIAN: Patient, [name]   REQUESTING/REFERRING PHYSICIAN: Dr Delana Meyer  CHIEF COMPLAINT:   Chief Complaint  Patient presents with  . Fall    HISTORY OF PRESENT ILLNESS:  Thomas [name]  is a 53 y.o. male with a known history of polysubstance abuse including alcohol, schizoaffective bipolar type, seizures, hypertension and insomnia who initially presented on 06/07/2019 following an episode of a fall.  Was evaluated in the emergency room and found to have right ankle fracture as well as right foot ischemia.  Was admitted to the ICU by vascular surgery.  Orthopedic service consulted.  Patient is due for postop open reduction with internal fixation of right ankle by orthopedic physician.  Patient is also status post angioplasty with stent placement in the right lower extremity by vascular surgery.  Patient subsequently went into alcohol withdrawal requiring multiple sedatives leading to respiratory failure and subsequently intubated on 06/10/2019.  Patient currently still requiring Precedex drip and restraints for his safety.  I was notified by nursing staff that patient also became hypotensive and requiring Neo-Synephrine drip.  Patient remains on the vent.  Psychiatry service also following patient. Medical service consulted today for general medical management. History was obtained from review of the chart and discussion with orthopedic physician as well as patient's nurse at bedside.  PAST MEDICAL HISTORY:   Past Medical History:  Diagnosis Date  . Alcohol abuse   . Anemia   . Bipolar 1 disorder ([place])   . Cervical spine fracture ([place])   . Chronic, continuous use of opioids   . History of seizure   . HTN (hypertension)   . Insomnia   . MDD (major depressive disorder), recurrent, severe, with psychosis  ([place])   . Pancreatitis     PAST SURGICAL HISTOIRY:   Past Surgical History:  Procedure Laterality Date  . LOWER EXTREMITY ANGIOGRAPHY Right 06/07/2019   Procedure: Lower Extremity Angiography;  Surgeon: Katha Cabal, MD;  Location: [facility];  Service: Cardiovascular;  Laterality: Right;  . ORIF ANKLE FRACTURE Right 06/08/2019   Procedure: OPEN REDUCTION INTERNAL FIXATION (ORIF) ANKLE FRACTURE;  Surgeon: Earnestine Leys, MD;  Location: [facility];  Service: Orthopedics;  Laterality: Right;    SOCIAL HISTORY:   Social History   Tobacco Use  . Smoking status: Current Every Day Smoker  . Smokeless tobacco: Never Used  Substance Use Topics  . Alcohol use: Yes    Comment: occasionally    FAMILY HISTORY:  History reviewed. No pertinent family history.  DRUG ALLERGIES:   Allergies  Allergen Reactions  . Haldol [Haloperidol Lactate]     REVIEW OF SYSTEMS:   Unobtainable at this time.  Patient sedated on the vent.  MEDICATIONS AT HOME:   Prior to Admission medications   Medication Sig Start Date End Date Taking? Authorizing Provider  albuterol (VENTOLIN HFA) 108 (90 Base) MCG/ACT inhaler Inhale 2 puffs into the lungs every 6 (six) hours as needed for wheezing. 02/24/19  Yes [provider]  doxepin (SINEQUAN) 100 MG capsule Take 200 mg by mouth at bedtime. 04/26/19  Yes [provider]  pregabalin (LYRICA) 200 MG capsule Take 200 mg by mouth 3 (three) times daily. 01/25/19  Yes [provider]  SUBOXONE 8-2 MG FILM Place 1 Film under the tongue  3 (three) times daily. 05/22/19  Yes [provider]      VITAL SIGNS:  Blood pressure 102/79, pulse (!) 57, temperature 97.7 F (36.5 C), temperature source Oral, resp. rate 19, height 6' 0.01" (1.829 m), weight 76.6 kg, SpO2 100 %.  PHYSICAL EXAMINATION:  GENERAL:  53 y.o.-year-old patient lying in the bed with no acute distress.  Sedated on the vent. EYES: Pupils equal, round,  reactive to light and accommodation. No scleral icterus. HEENT: Head atraumatic, normocephalic.  ET tube in place. NECK:  Supple, no jugular venous distention. No thyroid enlargement, no tenderness.  LUNGS: Normal breath sounds bilaterally, no wheezing, rales,rhonchi or crepitation. No use of accessory muscles of respiration.  CARDIOVASCULAR: S1, S2 normal. No murmurs, rubs, or gallops.  ABDOMEN: Soft, nontender, nondistended. Bowel sounds present. No organomegaly or mass.  EXTREMITIES: No pedal edema, cyanosis, or clubbing.  NEUROLOGIC: Sedated on the vent   PSYCHIATRIC: Patient sedated on the vent.  Requiring restraints for his safety. SKIN: No obvious rash, lesion, or ulcer.   LABORATORY PANEL:   CBC Recent Labs  Lab 06/12/19 0523  WBC 7.0  HGB 12.6*  HCT 39.5  PLT 157   ------------------------------------------------------------------------------------------------------------------  Chemistries  Recent Labs  Lab 06/11/19 0443 06/12/19 0523  NA 139 142  K 4.4 4.2  CL 109 112*  CO2 22 23  GLUCOSE 124* 117*  BUN 17 24*  CREATININE 0.70 0.71  CALCIUM 8.3* 8.6*  MG 1.7 2.0  AST 19  --   ALT 14  --   ALKPHOS 72  --   BILITOT 0.9  --    ------------------------------------------------------------------------------------------------------------------  Cardiac Enzymes No results for input(s): TROPONINI in the last 168 hours. ------------------------------------------------------------------------------------------------------------------  RADIOLOGY:  Dg Abd 1 View  Result Date: 06/11/2019 CLINICAL DATA:  Oral gastric tube placement EXAM: ABDOMEN - 1 VIEW COMPARISON:  06/10/2019 FINDINGS: Lung bases are clear. Esophageal tube tip overlies the proximal stomach, side-port at the cardia of the stomach. Large stool in the upper abdomen. Coils to the right of low L2. IMPRESSION: Esophageal tube tip overlies the proximal stomach. Electronically Signed   By: Jasmine PangKim  Fujinaga M.D.    On: 06/11/2019 23:27   Dg Chest Port 1 View  Result Date: 06/11/2019 CLINICAL DATA:  Acute respiratory failure. EXAM: PORTABLE CHEST 1 VIEW COMPARISON:  06/10/2019. FINDINGS: Endotracheal tube and stable position. NG tube tip has now been repositioned and is now below left hemidiaphragm. Heart size normal. Low lung volumes. Mild left base atelectasis/infiltrate. No pleural effusion or pneumothorax. IMPRESSION: 1. NG tube tip is now been repositioned and is now below left hemidiaphragm. Endotracheal tube in stable position. 2.  Low lung volumes.  Mild left base atelectasis/infiltrate. Electronically Signed   By: Maisie Fushomas  Register   On: 06/11/2019 06:54   [place] Ekg Site Rite  Result Date: 06/12/2019 If Site Rite image not attached, placement could not be confirmed due to current cardiac rhythm.   EKG:  No orders found for this or any previous visit.  IMPRESSION AND PLAN:  Patient is a 53 year old male with history of polysubstance abuse including alcohol, schizoaffective bipolar type, seizures, hypertension and insomnia who initially presented on 06/07/2019 following an episode of a fall.  Was found to have right lower extremity ischemia and right ankle fracture.  Went into alcohol withdrawal and currently sedated on the vent  1.  Acute hypoxic respiratory failure Patient went into alcohol withdrawal requiring several sedatives and subsequently intubated for hypoxic respiratory failure Remains sedated on the  vent. Being managed by ICU team.  2.  Right lower extremity ischemia Patient status post revascularization with angioplasty and stent placement by vascular surgery Vascular surgery managing  3.  Right ankle fracture. Patient status post open reduction with internal fixation of right ankle by orthopedic physician.  Day 4 postop Being managed by orthopedic surgery.  4.  History of schizoaffective bipolar type Currently sedated on the vent. Being followed by psychiatry service and  managing psych meds.  5.  Alcohol abuse now with delirium tremens Patient requiring sedation on the vent. Continue thiamine, multivitamin and folic acid.  DVT prophylaxis; Lovenox  Thank you for this consultation.  Medical service will continue to follow-up with patient.   All the records are reviewed and case discussed with Consulting provider.  CODE STATUS: Full code  TOTAL TIME TAKING CARE OF THIS PATIENT: 57 minutes.    Kandise Riehle M.D on 06/12/2019 at 1:54 PM  Between 7am to 6pm - Pager - [phone]  After 6pm go to [email or web address] - Social research officer, government  [facility] [place] [facility]  Office  [phone]  CC: Primary care Physician: Patient, [name]   Note: This dictation was prepared with Dragon dictation along with smaller phrase technology. Any transcriptional errors that result from this process are unintentional.

## 2019-06-12 NOTE — Progress Notes (Signed)
Pt has has had low blood pressure consistently throughout shift except for the episode of agitation and combativeness. Pt has been on a neo drip for low bp and it is currently at a rate of 40. Pt currently on a rate of Dilaudid 2 and precedex rate of 1.2. Pt has remained in NSR throughout shift again except for when agitation occurred at that point HR in the 150's. Pt has had good urine output and was administered a dulcolax suppository 10mg  due to having no bowel movements since admission. Pts girlfriend as well as mother has been advised by myself and charge nurse that we only allow one visitor for the entire stay. They were not accepting of the answer and have continuously tried to get it changed. Pts respiratory rate has been maintained by vent except when he disconnected himself from vent tube. Pt had a hard cast put on this am by ortho dr. Abbott Pao is currently resting comfortably.

## 2019-06-12 NOTE — Progress Notes (Addendum)
Name: Thomas [name] MRN: [id number] DOB: 12/04/1965     CONSULTATION DATE: 06/12/2019  CHIEF COMPLAINT:  Respiratory failure  SYNOPSIS:  Thomas [name] is a 53 yo male with an extensive psychiatric history and polysubstance abuse presented to the ED following a fall at home with right ankle fracture and right lower extremity ischemia and inability to locate pulse with doppler. After admission to the ICU, pt began exhibiting sx of drug/alchol withdrawal syndrome requiring multiple sedatives and leading to respiratory failure requiring intubation 9/27.  SIGNIFICANT EVENTS:  9/24 - Presented to ED with displaced right ankle fracture and limb ischemia 9/24 - Vascular surgery to revascularize right lower extremity 9/25 - Open reduction internal fixation of right ankle fracture medially and laterally 9/26 - patient on precedex and multiple PRN IV meds still tachycardic with episodes of aggitation and combative/aggresive behavior. Concern for loss of airway protection 9/27- patient attempting to physically attack staff and loudly verbally abusive, he is in high doses of IV sedation. After additional PRN IV medications patient temporarilly sedated with episodic apnea. Pt sedated and intubated. 9/28 failed SAT due to severe agitation and delerium   HISTORY OF PRESENT ILLNESS:   Currently, pt is intubated and sedated. Remains on vent   PAST MEDICAL HISTORY :   has a past medical history of Alcohol abuse, Anemia, Bipolar 1 disorder ([place]), Cervical spine fracture ([facility]), Chronic, continuous use of opioids, History of seizure, HTN (hypertension), Insomnia, MDD (major depressive disorder), recurrent, severe, with psychosis ([place]), and Pancreatitis.  has a past surgical history that includes Lower Extremity Angiography (Right, 06/07/2019) and ORIF ankle fracture (Right, 06/08/2019). Prior to Admission medications   Medication Sig Start Date End Date Taking? Authorizing Provider  albuterol (VENTOLIN  HFA) 108 (90 Base) MCG/ACT inhaler Inhale 2 puffs into the lungs every 6 (six) hours as needed for wheezing. 02/24/19  Yes [provider]  doxepin (SINEQUAN) 100 MG capsule Take 200 mg by mouth at bedtime. 04/26/19  Yes [provider]  pregabalin (LYRICA) 200 MG capsule Take 200 mg by mouth 3 (three) times daily. 01/25/19  Yes [provider]  SUBOXONE 8-2 MG FILM Place 1 Film under the tongue 3 (three) times daily. 05/22/19  Yes [provider]   Allergies  Allergen Reactions  . Haldol [Haloperidol Lactate]     REVIEW OF SYSTEMS:   Unable to obtain due to critical illness  VITAL SIGNS: Temp:  [97.3 F (36.3 C)-98 F (36.7 C)] 97.7 F (36.5 C) (09/29 0801) Pulse Rate:  [56-120] 63 (09/29 0700) BP: (68-137)/(54-91) 77/62 (09/29 0700) SpO2:  [100 %] 100 % (09/29 0700) FiO2 (%):  [28 %] 28 % (09/29 0739)   I/O last 3 completed shifts: In: 3791.1 [I.V.:3753.3; IV Piggyback:37.9] Out: 2420 [Urine:2420] No intake/output data recorded.   SpO2: 100 % O2 Flow Rate (L/min): 6 L/min FiO2 (%): 28 %   Physical Examination:  GENERAL:critically ill appearing, intubated and sedated HEAD: Normocephalic, atraumatic.  EYES: Pupils equal, round, reactive to light. +scleral edema.  No scleral icterus.  MOUTH: Moist mucosal membrane. NECK: Supple. No JVD.  PULMONARY: lungs clear to auscultation CARDIOVASCULAR: S1 and S2. Regular rate and rhythm. No murmurs, rubs, or gallops.  GASTROINTESTINAL: Soft, nontender, -distended. No masses. Positive bowel sounds. No hepatosplenomegaly.  MUSCULOSKELETAL: No swelling, clubbing, or edema.  NEUROLOGIC: obtunded SKIN:intact,warm,dry  I personally reviewed lab work that was obtained in last 24 hrs. CXR Independently reviewed  MEDICATIONS: I have reviewed all medications and confirmed regimen as documented  CULTURE RESULTS   Recent Results (from the past 240 hour(s))  SARS Coronavirus 2 [facility] order, Performed  in [facility] hospital lab) Nasopharyngeal Nasopharyngeal Swab     Status: None   Collection Time: 06/07/19  6:34 PM   Specimen: Nasopharyngeal Swab  Result Value Ref Range Status   SARS Coronavirus 2 NEGATIVE NEGATIVE Final    Comment: (NOTE) If result is NEGATIVE SARS-CoV-2 target nucleic acids are NOT DETECTED. The SARS-CoV-2 RNA is generally detectable in upper and lower  respiratory specimens during the acute phase of infection. The lowest  concentration of SARS-CoV-2 viral copies this assay can detect is 250  copies / mL. A negative result does not preclude SARS-CoV-2 infection  and should not be used as the sole basis for treatment or other  patient management decisions.  A negative result may occur with  improper specimen collection / handling, submission of specimen other  than nasopharyngeal swab, presence of viral mutation(s) within the  areas targeted by this assay, and inadequate number of viral copies  (<250 copies / mL). A negative result must be combined with clinical  observations, patient history, and epidemiological information. If result is POSITIVE SARS-CoV-2 target nucleic acids are DETECTED. The SARS-CoV-2 RNA is generally detectable in upper and lower  respiratory specimens dur ing the acute phase of infection.  Positive  results are indicative of active infection with SARS-CoV-2.  Clinical  correlation with patient history and other diagnostic information is  necessary to determine patient infection status.  Positive results do  not rule out bacterial infection or co-infection with other viruses. If result is PRESUMPTIVE POSTIVE SARS-CoV-2 nucleic acids MAY BE PRESENT.   A presumptive positive result was obtained on the submitted specimen  and confirmed on repeat testing.  While 2019 novel coronavirus  (SARS-CoV-2) nucleic acids may be present in the submitted sample  additional confirmatory testing may be necessary for epidemiological  and / or clinical  management purposes  to differentiate between  SARS-CoV-2 and other Sarbecovirus currently known to infect humans.  If clinically indicated additional testing with an alternate test  methodology [id number]) is advised. The SARS-CoV-2 RNA is generally  detectable in upper and lower respiratory sp ecimens during the acute  phase of infection. The expected result is Negative. Fact Sheet for Patients:  StrictlyIdeas.no Fact Sheet for Healthcare Providers: BankingDealers.co.za This test is not yet approved or cleared by the [place] FDA and has been authorized for detection and/or diagnosis of SARS-CoV-2 by FDA under an Emergency Use Authorization (EUA).  This EUA will remain in effect (meaning this test can be used) for the duration of the COVID-19 declaration under Section 564(b)(1) of the Act, 21 U.S.C. section 360bbb-3(b)(1), unless the authorization is terminated or revoked sooner. Performed at [facility], [address] [place], [place] [address]   MRSA PCR Screening     Status: None   Collection Time: 06/08/19  5:53 AM   Specimen: Nasal Mucosa; Nasopharyngeal  Result Value Ref Range Status   MRSA by PCR NEGATIVE NEGATIVE Final    Comment:        The GeneXpert MRSA Assay (FDA approved for NASAL specimens only), is one component of a comprehensive MRSA colonization surveillance program. It is not intended to diagnose MRSA infection nor to guide or monitor treatment for MRSA infections. Performed at [facility], [address], [place], [place] [address]           IMAGING    Dg Abd 1 View  Result Date: 06/11/2019 CLINICAL DATA:  Oral gastric tube placement EXAM: ABDOMEN - 1 VIEW COMPARISON:  06/10/2019 FINDINGS: Lung bases are clear. Esophageal tube tip overlies the proximal stomach, side-port at the cardia of the stomach. Large stool in the upper abdomen. Coils to the right of low L2. IMPRESSION:  Esophageal tube tip overlies the proximal stomach. Electronically Signed   By: Donavan Foil M.D.   On: 06/11/2019 23:27    Indwelling Urinary Catheter continued, requirement due to   Reason to continue Indwelling Urinary Catheter strict Intake/Output monitoring for hemodynamic instability         Ventilator continued, requirement due to severe respiratory failure   Ventilator Sedation RASS 0 to -2      ASSESSMENT AND PLAN SYNOPSIS 53 yo male with significant psychiatric history presents to ICU in acute respiratory failure following agitation and aggression towards staff requiring sedation. Originally, pt here because of right ankle displaced fracture and limb ischemia requiring orthopedic and vascular surgery.  Severe ACUTE Hypoxic Respiratory Failure -continue Full MV support -continue Bronchodilator Therapy -Wean Fio2 and PEEP as tolerated -will perform SAT/SBT when respiratory parameters are met  ETOH abuse with Severe Delirium Tremens -patient remains very agitated - pulled out OG tube overnight -needs ICU monitoring -Precedex infusion -CIWA protocol  VASC SURGERY Critical Limb ischemia RLL -Status post vascular procedure with stent placementrightsuperficial femoral artery and stent placementrightsuperficial femoral artery  -Vascular surgery on case-follow recommendations -oxygen as needed ICU monitoring  NEUROLOGY - intubated and sedated - minimal sedation to achieve a RASS goal: -1 Wake up assessment pending  CARDIAC ICU monitoring  ID -continue IV abx as prescibed -follow up cultures  GI GI PROPHYLAXIS as indicated  NUTRITIONAL STATUS DIET-->TF's as tolerated Constipation protocol as indicated  ENDO - will use ICU hypoglycemic\Hyperglycemia protocol if needed  ELECTROLYTES -follow labs as needed -replace as needed -pharmacy consultation and following  DVT/GI PRX ordered TRANSFUSIONS AS NEEDED MONITOR FSBS ASSESS the need for LABS     Critical Care Time devoted to patient care services described in this note is 40 minutes.   Overall, patient is critically ill, prognosis is guarded. high risk for cardiac arrest and death.    Corrin Parker, M.D.  [facility]  Medical Director [place] Director [facility] Cardio-Pulmonary Department

## 2019-06-12 NOTE — Progress Notes (Signed)
Subjective: 4 Days Post-Op Procedure(s) (LRB): OPEN REDUCTION INTERNAL FIXATION (ORIF) ANKLE FRACTURE (Right)   He remains obtunded on respirator.  CSM good in right foot.  Short leg cast and new dressing applied today.  Will remove in 2 weeks for staple removal.     Objective:   VITALS:   Vitals:   06/12/19 1100 06/12/19 1200  BP: 105/80 102/79  Pulse: (!) 59 (!) 57  Resp:    Temp:    SpO2: 100% 100%    Neurovascular intact Incision: no drainage  LABS Recent Labs    06/10/19 0451 06/11/19 0443 06/12/19 0523  HGB 13.7 13.0 12.6*  HCT 39.5 38.9* 39.5  WBC 6.9 5.1 7.0  PLT 131* 127* 157    Recent Labs    06/10/19 0451 06/11/19 0443 06/12/19 0523  NA 139 139 142  K 3.9 4.4 4.2  BUN 10 17 24*  CREATININE 0.55* 0.70 0.71  GLUCOSE 107* 124* 117*    No results for input(s): LABPT, INR in the last 72 hours.   Assessment/Plan: 4 Days Post-Op Procedure(s) (LRB): OPEN REDUCTION INTERNAL FIXATION (ORIF) ANKLE FRACTURE (Right)   Up with therapy when he improves

## 2019-06-12 NOTE — Consult Note (Signed)
[facility] Consult   Reason for Consult:  Medications for schizoaffective disorder Referring Physician:  Dr Sabra Heck Patient Identification: Thomas [name] MRN:  [id number] Principal Diagnosis: Ischemia of extremity Diagnosis:  Principal Problem:   Ischemia of extremity Active Problems:   Schizoaffective disorder, bipolar type ([place])   Acute respiratory failure ([place])  Total Time spent with patient: 30 minutes  Subjective:   Thomas [name] is a 53 y.o. male patient admitted with orthopedic and vascular injury of the right ankle/foot.  Psychiatry consult was called due to patient displaying aggressive behavior to nursing staff and history of bipolar schizophrenia off medications.    06/12/19: Patient sedated in ICU, unable to conduct evaluation, will continue to reassess daily. Today patient became agitated upon hearing his mother, and required additional sedation. Patient still having issues coming off of sedation safely.   06/11/19: Patient sedated in ICU, unable to conduct evaluation, will continue to reassess daily  06/10/19: Patient's behaviors continued to escalate and transferred to ICU.  His actions escalated higher and he was intubated.  Ammonia and other labs ordered.  This provider talked to his "wife" and his mother yesterday and they reported he has not had Suboxone in a "long time" but according to the [place] he received 21 on 06/05/19 along with Adderall and Pregabalin.  According to the records, he is receiving it on a regular basis.  However, typical opioid withdrawal reactions mimic flu like symptoms with irritability but not agitation, especially to this level.  He was on Doxepin 200 mg at bedtime and this should be tapered off to prevent agitation.  The Ativan did not seem to help his agitation either.  Family reported he was managed at home on Seroquel and baclofen.  Previous notes state he was also on Zyprexa.  Dr Mallie Darting suggested restarting the Seroquel,  Seroquel started back at 200 mg at bedtime vs 400 mg after he is extubated.  Later on 06/09/19: Patient's mother and his friend/wife [name] called for collateral information.  No recent alcohol use and no history of benzodiazepine use or abuse.  He takes Seroquel sporadically at home.  He does take Sonata, Lyrica, and Doxepin at bedtime.  Right now he is on pain medications and will not restart this along with Doxepin could increase his agitation.  Ativan alcohol detox protocol discontinued based on collateral information and Zyprexa 10 mg TID started, Seroquel discontinued to prevent oversedation.  06/09/19: Patient seen and evaluated in person by this provider.  Patient irritable on assessment, appears to be responding to internal stimuli at times as he stares at this provider and then off to the side.  Minimal verbal interaction on assessment.  Medication recommended placed yesterday by Dr Sheliah Mends.  Calm and no agitation notes placed at this time, agitation medications are in place.  Please avoid giving Ativan and Zyprexa together due to potential of dropping the pulse and BP too quickly.  HPI per psychiatrist: Patient is a 53 year old male presented to the emergency room with orthopedic and vascular injury of the right foot.  Last night shortly after presentation patient was displaying agitated and aggressive behaviors but due to the the need to operate on his foot was brought into surgery.  Overnight patient was abusive and aggressive with the nurses.   today patient had another operation and was seen in PACU shortly following procedure.  Due to sedation writer was unable to fully evaluate patient.  This was discussed with treatment team who asked for recommendations  regarding acute agitation in case patient were to become belligerent again tonight. Team is unsure of substance abuse problems given pain out of proportion to exam and therefore he was placed on CIWA protocol. Chart reviewed and case  discussed with treatment team.  Pharmacy was called and medication history was reviewed.  More recently patient was prescribed Zyprexa 5 mg, doxepin unknown milligrams, and Adderall 20 mg twice daily.  Past Psychiatric History: History of schizoaffective bipolar type. per mother patient has been noncompliant with his medications.  Risk to Self:  none Risk to Others:  none Prior Inpatient Therapy:  yes Prior Outpatient Therapy:  yes  Past Medical History:  Past Medical History:  Diagnosis Date  . Alcohol abuse   . Anemia   . Bipolar 1 disorder ([place])   . Cervical spine fracture ([place])   . Chronic, continuous use of opioids   . History of seizure   . HTN (hypertension)   . Insomnia   . MDD (major depressive disorder), recurrent, severe, with psychosis ([place])   . Pancreatitis     Past Surgical History:  Procedure Laterality Date  . LOWER EXTREMITY ANGIOGRAPHY Right 06/07/2019   Procedure: Lower Extremity Angiography;  Surgeon: Katha Cabal, MD;  Location: [facility];  Service: Cardiovascular;  Laterality: Right;  . ORIF ANKLE FRACTURE Right 06/08/2019   Procedure: OPEN REDUCTION INTERNAL FIXATION (ORIF) ANKLE FRACTURE;  Surgeon: Earnestine Leys, MD;  Location: [facility];  Service: Orthopedics;  Laterality: Right;   Family History: History reviewed. No pertinent family history. Family Psychiatric  History: none Social History:  Social History   Substance and Sexual Activity  Alcohol Use Yes   Comment: occasionally     Social History   Substance and Sexual Activity  Drug Use Not on file    Social History   Socioeconomic History  . Marital status: Single    Spouse name: Not on file  . Number of children: Not on file  . Years of education: Not on file  . Highest education level: Not on file  Occupational History  . Not on file  Social Needs  . Financial resource strain: Not on file  . Food insecurity    Worry: Not on file    Inability: Not on file  .  Transportation needs    Medical: Not on file    Non-medical: Not on file  Tobacco Use  . Smoking status: Current Every Day Smoker  . Smokeless tobacco: Never Used  Substance and Sexual Activity  . Alcohol use: Yes    Comment: occasionally  . Drug use: Not on file  . Sexual activity: Not on file  Lifestyle  . Physical activity    Days per week: Not on file    Minutes per session: Not on file  . Stress: Not on file  Relationships  . Social Herbalist on phone: Not on file    Gets together: Not on file    Attends religious service: Not on file    Active member of club or organization: Not on file    Attends meetings of clubs or organizations: Not on file    Relationship status: Not on file  Other Topics Concern  . Not on file  Social History Narrative  . Not on file   Additional Social History:  none    Allergies:   Allergies  Allergen Reactions  . Haldol [Haloperidol Lactate]     Labs:  Results for orders placed or  performed during the hospital encounter of 06/07/19 (from the past 48 hour(s))  CBC     Status: Abnormal   Collection Time: 06/11/19  4:43 AM  Result Value Ref Range   WBC 5.1 4.0 - 10.5 K/uL   RBC 3.84 (L) 4.22 - 5.81 MIL/uL   Hemoglobin 13.0 13.0 - 17.0 g/dL   HCT 38.9 (L) 39.0 - 52.0 %   MCV 101.3 (H) 80.0 - 100.0 fL   MCH 33.9 26.0 - 34.0 pg   MCHC 33.4 30.0 - 36.0 g/dL   RDW 12.9 11.5 - 15.5 %   Platelets 127 (L) 150 - 400 K/uL   nRBC 0.0 0.0 - 0.2 %    Comment: Performed at [facility], [address] [place], [place] [address]  Comprehensive metabolic panel     Status: Abnormal   Collection Time: 06/11/19  4:43 AM  Result Value Ref Range   Sodium 139 135 - 145 mmol/L   Potassium 4.4 3.5 - 5.1 mmol/L   Chloride 109 98 - 111 mmol/L   CO2 22 22 - 32 mmol/L   Glucose, Bld 124 (H) 70 - 99 mg/dL   BUN 17 6 - 20 mg/dL   Creatinine, Ser 0.70 0.61 - 1.24 mg/dL   Calcium 8.3 (L) 8.9 - 10.3 mg/dL   Total Protein 5.9 (L) 6.5 -  8.1 g/dL   Albumin 3.3 (L) 3.5 - 5.0 g/dL   AST 19 15 - 41 U/L   ALT 14 0 - 44 U/L   Alkaline Phosphatase 72 38 - 126 U/L   Total Bilirubin 0.9 0.3 - 1.2 mg/dL   GFR calc non Af Amer >60 >60 mL/min   GFR calc Af Amer >60 >60 mL/min   Anion gap 8 5 - 15    Comment: Performed at [facility], [address], [place], [place] [address]  Magnesium     Status: None   Collection Time: 06/11/19  4:43 AM  Result Value Ref Range   Magnesium 1.7 1.7 - 2.4 mg/dL    Comment: Performed at [facility], [address] [place], [place] [address]  Phosphorus     Status: None   Collection Time: 06/11/19  4:43 AM  Result Value Ref Range   Phosphorus 4.3 2.5 - 4.6 mg/dL    Comment: Performed at [facility], [place], [place] [address]  Troponin I (High Sensitivity)     Status: Abnormal   Collection Time: 06/11/19  9:59 AM  Result Value Ref Range   Troponin I (High Sensitivity) 289 (HH) <18 ng/L    Comment: CRITICAL RESULT CALLED TO, READ BACK BY AND VERIFIED WITH LESLIE MITTS AT 1103 ON 06/11/19 JJB (NOTE) Elevated high sensitivity troponin I (hsTnI) values and significant  changes across serial measurements may suggest ACS but many other  chronic and acute conditions are known to elevate hsTnI results.  Refer to the "Links" section for chest pain algorithms and additional  guidance. Performed at [facility], [address] [place], [place] [address]   CK     Status: None   Collection Time: 06/11/19 10:22 AM  Result Value Ref Range   Total CK 153 49 - 397 U/L    Comment: Performed at [facility], [address] [place], [place] [address]  Lactic acid, plasma     Status: None   Collection Time: 06/11/19 10:22 AM  Result Value Ref Range   Lactic Acid, Venous 0.9 0.5 -  1.9 mmol/L    Comment: Performed at [facility] [address] [place], [place] [address]  Blood gas, arterial     Status: Abnormal   Collection  Time: 06/11/19 11:53 AM  Result Value Ref Range   FIO2 0.28    Delivery systems VENTILATOR    Mode PRESSURE REGULATED VOLUME CONTROL    VT 450 mL   LHR 16 resp/min   Peep/cpap 5.0 cm H20   pH, Arterial 7.30 (L) 7.350 - 7.450   pCO2 arterial 42 32.0 - 48.0 mmHg   pO2, Arterial 48 (L) 83.0 - 108.0 mmHg   Bicarbonate 20.7 20.0 - 28.0 mmol/L   Acid-base deficit 5.5 (H) 0.0 - 2.0 mmol/L   O2 Saturation 78.6 %   Patient temperature 37.0    Collection site LEFT BRACHIAL    Sample type VENOUS     Comment: Performed at [facility], [address] [place], [place] [address]  Lactic acid, plasma     Status: None   Collection Time: 06/11/19 12:22 PM  Result Value Ref Range   Lactic Acid, Venous 1.1 0.5 - 1.9 mmol/L    Comment: Performed at [facility], [place], [place] [address]  Troponin I (High Sensitivity)     Status: Abnormal   Collection Time: 06/11/19 12:22 PM  Result Value Ref Range   Troponin I (High Sensitivity) 249 (HH) <18 ng/L    Comment: CRITICAL VALUE NOTED. VALUE IS CONSISTENT WITH PREVIOUSLY REPORTED/CALLED VALUE JJB (NOTE) Elevated high sensitivity troponin I (hsTnI) values and significant  changes across serial measurements may suggest ACS but many other  chronic and acute conditions are known to elevate hsTnI results.  Refer to the "Links" section for chest pain algorithms and additional  guidance. Performed at [facility], [address] [place], [place] [address]   Basic metabolic panel     Status: Abnormal   Collection Time: 06/12/19  5:23 AM  Result Value Ref Range   Sodium 142 135 - 145 mmol/L   Potassium 4.2 3.5 - 5.1 mmol/L   Chloride 112 (H) 98 - 111 mmol/L   CO2 23 22 - 32 mmol/L   Glucose, Bld 117 (H) 70 - 99 mg/dL   BUN 24 (H) 6 - 20 mg/dL   Creatinine, Ser 0.71 0.61 - 1.24 mg/dL   Calcium 8.6 (L) 8.9 - 10.3 mg/dL   GFR calc non Af Amer >60 >60 mL/min   GFR calc Af Amer >60 >60 mL/min   Anion gap 7  5 - 15    Comment: Performed at [facility], [address] [place], [place] [address]  Magnesium     Status: None   Collection Time: 06/12/19  5:23 AM  Result Value Ref Range   Magnesium 2.0 1.7 - 2.4 mg/dL    Comment: Performed at [facility], [address] [place], [place] [address]  CBC with Differential/Platelet     Status: Abnormal   Collection Time: 06/12/19  5:23 AM  Result Value Ref Range   WBC 7.0 4.0 - 10.5 K/uL   RBC 3.76 (L) 4.22 - 5.81 MIL/uL   Hemoglobin 12.6 (L) 13.0 - 17.0 g/dL   HCT 39.5 39.0 - 52.0 %   MCV 105.1 (H) 80.0 - 100.0 fL   MCH 33.5 26.0 - 34.0 pg   MCHC 31.9 30.0 - 36.0 g/dL   RDW 13.8 11.5 - 15.5 %   Platelets 157 150 - 400 K/uL  nRBC 0.0 0.0 - 0.2 %   Neutrophils Relative % 58 %   Neutro Abs 4.0 1.7 - 7.7 K/uL   Lymphocytes Relative 30 %   Lymphs Abs 2.1 0.7 - 4.0 K/uL   Monocytes Relative 11 %   Monocytes Absolute 0.8 0.1 - 1.0 K/uL   Eosinophils Relative 1 %   Eosinophils Absolute 0.1 0.0 - 0.5 K/uL   Basophils Relative 0 %   Basophils Absolute 0.0 0.0 - 0.1 K/uL   Immature Granulocytes 0 %   Abs Immature Granulocytes 0.02 0.00 - 0.07 K/uL    Comment: Performed at [facility], [address] [place], [place] [address]    Current Facility-Administered Medications  Medication Dose Route Frequency Provider Last Rate Last Dose  . acetaminophen (TYLENOL) tablet 650 mg  650 mg Per Tube Q4H PRN Flora Lipps, MD      . aspirin chewable tablet 81 mg  81 mg Oral Daily Hallaji, Sheema M, RPH   81 mg at 06/12/19 1427  . baclofen (LIORESAL) tablet 20 mg  20 mg Per Tube TID Flora Lipps, MD   20 mg at 06/12/19 1556  . bisacodyl (DULCOLAX) suppository 10 mg  10 mg Rectal Daily PRN Flora Lipps, MD   10 mg at 06/12/19 1613  . chlorhexidine gluconate (MEDLINE KIT) (PERIDEX) 0.12 % solution 15 mL  15 mL Mouth Rinse BID Ottie Glazier, MD   15 mL at 06/12/19 0813  . Chlorhexidine Gluconate Cloth 2 % PADS 6 each  6 each  Topical Daily Ottie Glazier, MD   6 each at 06/12/19 [phone]  . clopidogrel (PLAVIX) tablet 75 mg  75 mg Per Tube Daily Flora Lipps, MD   75 mg at 06/12/19 0949  . dexmedetomidine (PRECEDEX) 400 MCG/100ML (4 mcg/mL) infusion  0.4-1.2 mcg/kg/hr Intravenous Titrated Ottie Glazier, MD 23 mL/hr at 06/12/19 1632 1.2 mcg/kg/hr at 06/12/19 1632  . diazepam (VALIUM) tablet 5 mg  5 mg Oral Q6H Flora Lipps, MD   5 mg at 06/12/19 1741  . doxepin (SINEQUAN) capsule 150 mg  150 mg Oral QHS Patrecia Pour, NP   150 mg at 06/11/19 2204  . enoxaparin (LOVENOX) injection 40 mg  40 mg Subcutaneous Q24H Flora Lipps, MD   40 mg at 06/12/19 0951  . famotidine (PEPCID) tablet 20 mg  20 mg Per Tube BID Flora Lipps, MD   20 mg at 06/12/19 0949  . feeding supplement (PRO-STAT SUGAR FREE 64) liquid 30 mL  30 mL Per Tube TID Flora Lipps, MD   30 mL at 06/12/19 1557  . feeding supplement (VITAL 1.5 CAL) liquid 1,000 mL  1,000 mL Per Tube Continuous Flora Lipps, MD 45 mL/hr at 06/11/19 1405 1,000 mL at 06/11/19 1405  . folic acid (FOLVITE) tablet 1 mg  1 mg Per Tube Daily Flora Lipps, MD   1 mg at 06/12/19 0950  . HYDROmorphone (DILAUDID) 50 mg in sodium chloride 0.9 % 100 mL (0.5 mg/mL) infusion  0.5-4 mg/hr Intravenous Continuous Flora Lipps, MD 4 mL/hr at 06/12/19 1500 2 mg/hr at 06/12/19 1500  . HYDROmorphone (DILAUDID) bolus via infusion 0.2 mg  0.2 mg Intravenous Q30 min PRN Flora Lipps, MD      . HYDROmorphone (DILAUDID) injection 0.5 mg  0.5 mg Intravenous Once Flora Lipps, MD      . influenza vac split quadrivalent PF (FLUARIX) injection 0.5 mL  0.5 mL Intramuscular Tomorrow-1000 Earnestine Leys, MD      . LORazepam (ATIVAN) injection 1 mg  1 mg Intravenous Q1H PRN Flora Lipps, MD   1 mg at 06/12/19 1433  . MEDLINE mouth rinse  15 mL Mouth Rinse 10 times per day Ottie Glazier, MD   15 mL at 06/12/19 1741  . multivitamin liquid 15 mL  15 mL Per Tube Daily Flora Lipps, MD   15 mL at 06/12/19 0950  .  phenylephrine (NEO-SYNEPHRINE) 10 mg in sodium chloride 0.9 % 250 mL (0.04 mg/mL) infusion  0-400 mcg/min Intravenous Titrated Flora Lipps, MD 60 mL/hr at 06/12/19 1702 40 mcg/min at 06/12/19 1702  . QUEtiapine (SEROQUEL) tablet 200 mg  200 mg Per Tube QHS Flora Lipps, MD   200 mg at 06/11/19 2204  . senna-docusate (Senokot-S) tablet 2 tablet  2 tablet Oral BID Benita Gutter, [facility]   2 tablet at 06/12/19 9201  . sodium chloride flush (NS) 0.9 % injection 10-40 mL  10-40 mL Intracatheter Q12H Earnestine Leys, MD   10 mL at 06/12/19 1619  . sodium chloride flush (NS) 0.9 % injection 10-40 mL  10-40 mL Intracatheter PRN Earnestine Leys, MD      . sodium chloride flush (NS) 0.9 % injection 3 mL  3 mL Intravenous PRN Earnestine Leys, MD      . thiamine (VITAMIN B-1) tablet 100 mg  100 mg Per Tube Daily Flora Lipps, MD   100 mg at 06/12/19 0071    Musculoskeletal: Strength & Muscle Tone: decreased Gait & Station: did not witness Patient leans: N/A  Psychiatric Specialty Exam: Physical Exam  Nursing note and vitals reviewed. Constitutional: He appears well-developed and well-nourished.  HENT:  Head: Normocephalic.  Respiratory: Effort normal.  Psychiatric: His affect is blunt. He is slowed. Cognition and memory are impaired. He expresses impulsivity and inappropriate judgment.    Review of Systems  All other systems reviewed and are negative.   Blood pressure 134/84, pulse 63, temperature 97.7 F (36.5 C), temperature source Oral, resp. rate 19, height 6' 0.01" (1.829 m), weight 76.6 kg, SpO2 100 %.Body mass index is 22.9 kg/m.  General Appearance: Casual  Eye Contact:  intubated  Speech:  intubated  Volume:  intubated  Mood:  intubated  Affect:  Blunt  Thought Process:  Unable to assess  Orientation:  Unable to assess  Thought Content:  Unable to assess  Suicidal Thoughts:  Unable to assess  Homicidal Thoughts:  Unable to assess  Memory:  Unable to assess  Judgement:  Impaired   Insight:  Lacking  Psychomotor Activity:  Decreased  Concentration:  Unable to assess  Recall:  Intubated  Fund of Knowledge:  Intubated  Language:  None  Akathisia:  No  Handed:  Right  AIMS (if indicated):     Assets:  Housing Leisure Time Resilience Social Support  ADL's:  Impaired  Cognition:  Impaired, severe at this time d/t intubation  Sleep:        Treatment Plan Summary: Daily contact with patient to assess and evaluate symptoms and progress in treatment, Medication management and Plan schizoaffective disorder, bipolar type:  -Restart Seroquel once he is extubated at 200 mg at bedtime -Continue Zyprexa 10 mg TID   Insomnia: -Decrease Doxepin from 200 mg (home dose) to 150 mg once he is extubated  Disposition: Supportive therapy provided about ongoing stressors.  Will continue to reassess  Dixie Dials, MD 06/12/2019 5:57 PM

## 2019-06-12 NOTE — Progress Notes (Signed)
Spoke with RN about the current IV team order that was placed. The order was to check established PICC/CVC. The patient at this time does not have a PICC or CVC but has PIV. I asked the RN if she needed another PIV or Midline placed. The RN stated no she needs a PICC line, I asked her if she was familiar with how to put the correct order in so that the PICC Nurses would be able to see it. Education was given on how to place the order and the RN stated okay. PICC team was also notified to follow up with RN.

## 2019-06-12 NOTE — Consult Note (Signed)
PHARMACY CONSULT NOTE - FOLLOW UP  Pharmacy Consult for Electrolyte Monitoring and Replacement   53 y/o M admitted with right lower extremity ischemia and right ankle fracture now s/p peripheral revascularization and ORIF. Patient has a history significant for schizophrenia and substance abuse. Post-operatively he became extremely agitated likely secondary to drug/alcohol withdrawal requiring high doses of sedatives. He is currently intubated and sedated in the ICU.   Recent Labs: Potassium (mmol/L)  Date Value  06/12/2019 4.2   Magnesium (mg/dL)  Date Value  06/12/2019 2.0   Calcium (mg/dL)  Date Value  06/12/2019 8.6 (L)   Albumin (g/dL)  Date Value  06/11/2019 3.3 (L)   Phosphorus (mg/dL)  Date Value  06/11/2019 4.3   Sodium (mmol/L)  Date Value  06/12/2019 142    Electrolytes: Patient has a history of alcohol abuse. Baseline nutritional status un-clear. Electrolytes have been within normal limits this admission. Starting tube feeds today.Goal of therapy to replete electrolytes to normal levels. Patient with good UOP. No BM this admission so far.     -BMP and Mg tomorrow AM  Constipation: Last bowel movement prior to admission. Constipating medications include hydromorphone and doxepin.     -Continue senna-docusate 2 tablets BID    -Start bisacodyl suppository PRN  Glucose: No baseline A1c in chart and no history of diabetes recorded. Tube feeds starting today. Received one dose of methylprednisolone 125 mg on 9/27. Medications that could contribute to development of new-onset diabetes include quetiapine. Glucoses have been normal with a few mild hyperglycemic readings which may be associated with steroids.     -Continue to monitor for hyper/hypoglycemia   Mutual Resident 06/12/2019 11:12 AM

## 2019-06-13 ENCOUNTER — Inpatient Hospital Stay: Payer: Medicaid Other

## 2019-06-13 ENCOUNTER — Inpatient Hospital Stay
Admit: 2019-06-13 | Discharge: 2019-06-13 | Disposition: A | Discharge status: Home / Self Care | Payer: Medicaid Other | Attending: Internal Medicine | Admitting: Internal Medicine

## 2019-06-13 DIAGNOSIS — I998 Other disorder of circulatory system: Secondary | ICD-10-CM | POA: Diagnosis not present

## 2019-06-13 LAB — BASIC METABOLIC PANEL
Anion gap: 3 — ABNORMAL LOW (ref 5–15)
BUN: 18 mg/dL (ref 6–20)
CO2: 25 mmol/L (ref 22–32)
Calcium: 7 mg/dL — ABNORMAL LOW (ref 8.9–10.3)
Chloride: 115 mmol/L — ABNORMAL HIGH (ref 98–111)
Creatinine, Ser: 0.51 mg/dL — ABNORMAL LOW (ref 0.61–1.24)
GFR calc Af Amer: >60 mL/min (ref >60)
GFR calc non Af Amer: >60 mL/min (ref >60)
Glucose, Bld: 107 mg/dL — ABNORMAL HIGH (ref 70–99)
Potassium: 3.3 mmol/L — ABNORMAL LOW (ref 3.5–5.1)
Sodium: 143 mmol/L (ref 135–145)

## 2019-06-13 LAB — CBC
HCT: 34.6 % — ABNORMAL LOW (ref 39.0–52.0)
Hemoglobin: 11.1 g/dL — ABNORMAL LOW (ref 13.0–17.0)
MCH: 33.4 pg (ref 26.0–34.0)
MCHC: 32.1 g/dL (ref 30.0–36.0)
MCV: 104.2 fL — ABNORMAL HIGH (ref 80.0–100.0)
Platelets: 139 10*3/uL — ABNORMAL LOW (ref 150–400)
RBC: 3.32 MIL/uL — ABNORMAL LOW (ref 4.22–5.81)
RDW: 13.6 % (ref 11.5–15.5)
WBC: 5.5 10*3/uL (ref 4.0–10.5)
nRBC: 0 % (ref 0.0–0.2)

## 2019-06-13 LAB — TRIGLYCERIDES: Triglycerides: 59 mg/dL (ref <150)

## 2019-06-13 LAB — MAGNESIUM: Magnesium: 1.6 mg/dL — ABNORMAL LOW (ref 1.7–2.4)

## 2019-06-13 LAB — PHOSPHORUS: Phosphorus: 2.5 mg/dL (ref 2.5–4.6)

## 2019-06-13 MED ORDER — POTASSIUM CHLORIDE 20 MEQ PO PACK
20.0000 meq | PACK | Freq: Once | ORAL | Status: AC
  Administered 2019-06-13: 20 meq via ORAL
  Filled 2019-06-13: qty 1

## 2019-06-13 MED ORDER — SODIUM CHLORIDE 0.9 % IV SOLN
3.0000 g | Freq: Four times a day (QID) | INTRAVENOUS | Status: DC
  Administered 2019-06-13 – 2019-06-16 (×11): 3 g via INTRAVENOUS
  Filled 2019-06-13 (×2): qty 8
  Filled 2019-06-13 (×4): qty 3
  Filled 2019-06-13: qty 8
  Filled 2019-06-13: qty 3
  Filled 2019-06-13: qty 8
  Filled 2019-06-13: qty 3
  Filled 2019-06-13: qty 8
  Filled 2019-06-13: qty 3
  Filled 2019-06-13 (×2): qty 8

## 2019-06-13 MED ORDER — MIDAZOLAM HCL 2 MG/2ML IJ SOLN: 2.0000 mg | INTRAMUSCULAR | Status: DC | PRN

## 2019-06-13 MED ORDER — VECURONIUM BROMIDE 10 MG IV SOLR
10.0000 mg | Freq: Once | INTRAVENOUS | Status: AC
  Administered 2019-06-13: 10 mg via INTRAVENOUS

## 2019-06-13 MED ORDER — MIDAZOLAM BOLUS VIA INFUSION
1.0000 mg | INTRAVENOUS | Status: DC | PRN
  Filled 2019-06-13: qty 2

## 2019-06-13 MED ORDER — PROPOFOL 1000 MG/100ML IV EMUL
INTRAVENOUS | Status: AC
  Filled 2019-06-13: qty 100

## 2019-06-13 MED ORDER — PROPOFOL 1000 MG/100ML IV EMUL: 5.0000 ug/kg/min | INTRAVENOUS | Status: DC

## 2019-06-13 MED ORDER — VECURONIUM BROMIDE 10 MG IV SOLR
INTRAVENOUS | Status: AC
  Filled 2019-06-13: qty 10

## 2019-06-13 MED ORDER — MIDAZOLAM 50MG/50ML (1MG/ML) PREMIX INFUSION: 0.0000 mg/h | INTRAVENOUS | Status: DC

## 2019-06-13 MED ORDER — LORAZEPAM 2 MG/ML IJ SOLN
INTRAMUSCULAR | Status: AC
  Administered 2019-06-13: 1 mg
  Filled 2019-06-13: qty 1

## 2019-06-13 MED ORDER — MIDAZOLAM BOLUS VIA INFUSION
1.0000 mg | INTRAVENOUS | Status: DC | PRN
  Administered 2019-06-15 – 2019-06-18 (×10): 2 mg via INTRAVENOUS
  Filled 2019-06-13: qty 2

## 2019-06-13 MED ORDER — MIDAZOLAM 50MG/50ML (1MG/ML) PREMIX INFUSION
0.0000 mg/h | INTRAVENOUS | Status: DC
  Administered 2019-06-13 (×2): 6 mg/h via INTRAVENOUS
  Administered 2019-06-14: 5 mg/h via INTRAVENOUS
  Administered 2019-06-14: 02:00:00 8 mg/h via INTRAVENOUS
  Administered 2019-06-14 – 2019-06-15 (×2): 5 mg/h via INTRAVENOUS
  Administered 2019-06-15: 3 mg/h via INTRAVENOUS
  Administered 2019-06-16: 13:00:00 2 mg/h via INTRAVENOUS
  Administered 2019-06-16 – 2019-06-17 (×2): 4 mg/h via INTRAVENOUS
  Administered 2019-06-17: 5 mg/h via INTRAVENOUS
  Administered 2019-06-18: 6 mg/h via INTRAVENOUS
  Administered 2019-06-18: 7 mg/h via INTRAVENOUS
  Administered 2019-06-18: 4 mg/h via INTRAVENOUS
  Administered 2019-06-18 – 2019-06-19 (×2): 8 mg/h via INTRAVENOUS
  Administered 2019-06-19: 2 mg/h via INTRAVENOUS
  Administered 2019-06-20: 1 mg/h via INTRAVENOUS
  Filled 2019-06-13 (×17): qty 50

## 2019-06-13 MED ORDER — POTASSIUM CHLORIDE 20 MEQ PO PACK
40.0000 meq | PACK | ORAL | Status: DC
  Administered 2019-06-13: 12:00:00 40 meq via ORAL
  Filled 2019-06-13: qty 2

## 2019-06-13 MED ORDER — MAGNESIUM SULFATE 4 GM/100ML IV SOLN
4.0000 g | Freq: Once | INTRAVENOUS | Status: AC
  Administered 2019-06-13: 4 g via INTRAVENOUS
  Filled 2019-06-13: qty 100

## 2019-06-13 MED ORDER — FREE WATER
100.0000 mL | Status: DC
  Administered 2019-06-13 – 2019-06-20 (×42): 100 mL

## 2019-06-13 MED ORDER — POTASSIUM & SODIUM PHOSPHATES 280-160-250 MG PO PACK
2.0000 | PACK | ORAL | Status: AC
  Administered 2019-06-13 (×2): 2 via ORAL
  Filled 2019-06-13 (×2): qty 2

## 2019-06-13 MED ORDER — VECURONIUM BROMIDE 10 MG IV SOLR: 10.0000 mg | Freq: Once | INTRAVENOUS | Status: AC

## 2019-06-13 MED ORDER — LORAZEPAM 2 MG/ML IJ SOLN
2.0000 mg | Freq: Once | INTRAMUSCULAR | Status: AC
  Administered 2019-06-13: 2 mg via INTRAVENOUS

## 2019-06-13 MED ORDER — VECURONIUM BROMIDE 10 MG IV SOLR
INTRAVENOUS | Status: AC
  Administered 2019-06-13: 10:00:00 10 mg
  Filled 2019-06-13: qty 10

## 2019-06-13 MED ORDER — SODIUM CHLORIDE 0.9 % IV SOLN
0.5000 mg/h | INTRAVENOUS | Status: DC
  Administered 2019-06-13: 0.5 mg/h via INTRAVENOUS
  Filled 2019-06-13: qty 10

## 2019-06-13 MED ORDER — LORAZEPAM 2 MG/ML IJ SOLN
INTRAMUSCULAR | Status: AC
  Filled 2019-06-13: qty 1

## 2019-06-13 MED ORDER — VECURONIUM BROMIDE 10 MG IV SOLR
INTRAVENOUS | Status: AC
  Administered 2019-06-13: 10 mg
  Filled 2019-06-13: qty 10

## 2019-06-13 NOTE — Progress Notes (Signed)
[facility] Daily Progress Note   Subjective: 5 Days Post-Op: 1. Introduction catheter intorightlower extremity 3rd order catheter placement  2.Contrast injectionrightlower extremity for distal runoff  3. Percutaneous transluminal angioplasty and stent placementrightsuperficial femoral artery  4. Percutaneous transluminal angioplasty and stent placement right external iliac artery 5.Star close closureleftcommon femoral arteriotomy  Intubated and sedated for continued agitation. Mother at bedside.   Objective: Vitals:   06/13/19 0900 06/13/19 1000 06/13/19 1100 06/13/19 1200  BP: 108/74 (!) 220/123 (!) 89/69 (!) 59/45  Pulse: 70 (!) 28 (!) 127 85  Resp:      Temp:    97.8 F (36.6 C)  TempSrc:    Axillary  SpO2: 99% 91% 96% 93%  Weight:      Height:        Intake/Output Summary (Last 24 hours) at 06/13/2019 1305 Last data filed at 06/13/2019 1246 Gross per 24 hour  Intake 5265.06 ml  Output 1735 ml  Net 3530.06 ml   Physical Exam: Intubated and sedated. NAD CV: RRR Pulmonary: CTA Bilaterally Abdomen: Soft, Nontender, Nondistended Vascular: Right Lower Extremity: Thigh soft, Calf soft. Unable to access pedal pulses due to cast however toes are warm with a good capillary refill.   Laboratory: CBC    Component Value Date/Time   WBC 5.5 06/13/2019 0426   HGB 11.1 (L) 06/13/2019 0426   HCT 34.6 (L) 06/13/2019 0426   PLT 139 (L) 06/13/2019 0426   BMET    Component Value Date/Time   NA 143 06/13/2019 0426   K 3.3 (L) 06/13/2019 0426   CL 115 (H) 06/13/2019 0426   CO2 25 06/13/2019 0426   GLUCOSE 107 (H) 06/13/2019 0426   BUN 18 06/13/2019 0426   CREATININE 0.51 (L) 06/13/2019 0426   CALCIUM 7.0 (L) 06/13/2019 0426   GFRNONAA >60 06/13/2019 0426   GFRAA >60 06/13/2019 0426   Assessment/Planning: The patient is a 53 year old male who presented with right  ankle fracture and right lower extremity ischemia 1) Appreciate assistance in management from ICU, psychiatry and medicine  2) Successful right lower extremity revascularization - no further recommendation from vascular at this time. Will continue to monitor 3) Mother at bedside today. Inquiring about endovascular procedure. Discussed findings, intervention (R iliac / R SFA stents). Also discussed his need to follow up in the outpatient setting for continued care.   Discussed with Dr. Eber Hong Lydian Chavous PA-C 06/13/2019 1:05 PM

## 2019-06-13 NOTE — Progress Notes (Addendum)
Name: Thomas [name] MRN: [id number] DOB: 01-10-1966     CONSULTATION DATE: 06/13/2019  CHIEF COMPLAINT:  Respiratory failure  SYNOPSIS:  Thomas [name] is a 53 yo male with an extensive psychiatric history and polysubstance abuse presented to the ED following a fall at home with right ankle fracture and right lower extremity ischemia and inability to locate pulse with doppler. After admission to the ICU, pt began exhibiting sx of drug/alchol withdrawal syndrome requiring multiple sedatives and leading to respiratory failure requiring intubation 9/27.  SIGNIFICANT EVENTS:  9/24 - Presented to ED with displaced right ankle fracture and limb ischemia 9/24 - Vascular surgery to revascularize right lower extremity 9/25 - Open reduction internal fixation of right ankle fracture medially and laterally 9/26 - patient on precedex and multiple PRN IV meds still tachycardic with episodes of aggitation and combative/aggresive behavior. Concern for loss of airway protection 9/27- patient attempting to physically attack staff and loudly verbally abusive, he is in high doses of IV sedation. After additional PRN IV medications patient temporarilly sedated with episodic apnea. Pt sedated and intubated. 9/28 failed SAT due to severe agitation and delirium 9/29 PICC line placed 9/30 failed SAT again for severe agitation and delirium    HISTORY OF PRESENT ILLNESS:   Currently, pt is on ventilator and sedated. Wakeup assessment today led to pt kicking and thrashing, trying to get free. He required 20 mg vecuronium for increased WOB and vent dyssynchrony  PAST MEDICAL HISTORY :   has a past medical history of Alcohol abuse, Anemia, Bipolar 1 disorder ([place]), Cervical spine fracture ([place]), Chronic, continuous use of opioids, History of seizure, HTN (hypertension), Insomnia, MDD (major depressive disorder), recurrent, severe, with psychosis ([place]), and Pancreatitis.  has a past surgical history that includes  Lower Extremity Angiography (Right, 06/07/2019) and ORIF ankle fracture (Right, 06/08/2019). Prior to Admission medications   Medication Sig Start Date End Date Taking? Authorizing Provider  albuterol (VENTOLIN HFA) 108 (90 Base) MCG/ACT inhaler Inhale 2 puffs into the lungs every 6 (six) hours as needed for wheezing. 02/24/19  Yes [provider]  doxepin (SINEQUAN) 100 MG capsule Take 200 mg by mouth at bedtime. 04/26/19  Yes [provider]  pregabalin (LYRICA) 200 MG capsule Take 200 mg by mouth 3 (three) times daily. 01/25/19  Yes [provider]  SUBOXONE 8-2 MG FILM Place 1 Film under the tongue 3 (three) times daily. 05/22/19  Yes [provider]   Allergies  Allergen Reactions  . Haldol [Haloperidol Lactate]     REVIEW OF SYSTEMS:   Unable to obtain due to critical illness  VITAL SIGNS: Temp:  [97.5 F (36.4 C)-99.9 F (37.7 C)] 97.5 F (36.4 C) (09/30 0800) Pulse Rate:  [57-138] 70 (09/30 0900) BP: (70-134)/(54-89) 108/74 (09/30 0900) SpO2:  [97 %-100 %] 99 % (09/30 0900) FiO2 (%):  [24 %-60 %] 28 % (09/30 0745)   I/O last 3 completed shifts: In: 5861.8 [I.V.:3512.3; NG/GT:1825.5; IV Piggyback:524] Out: 1725 [Urine:1725] Total I/O In: 20 [I.V.:20] Out: 160 [Urine:10; Stool:150]   SpO2: 99 % O2 Flow Rate (L/min): 6 L/min FiO2 (%): 28 %  Vent Mode: PRVC FiO2 (%):  [24 %-60 %] 28 % Set Rate:  [16 bmp] 16 bmp Vt Set:  [450 mL] 450 mL PEEP:  [5 cmH20] 5 cmH20   Physical Examination:  GENERAL:critically ill appearing, -resp distress HEAD: Normocephalic, atraumatic.  EYES: Pupils equal, round, reactive to light.  No scleral icterus.  MOUTH: Moist mucosal membrane. NECK: Supple.  No JVD.  PULMONARY: lungs clear to auscultation CARDIOVASCULAR: S1 and S2. Regular rate and rhythm. No murmurs, rubs, or gallops.  GASTROINTESTINAL: Soft, nontender, -distended. No masses. Positive bowel sounds. No hepatosplenomegaly.  MUSCULOSKELETAL: No  swelling, clubbing, or edema.  NEUROLOGIC: obtunded, RASS -3 SKIN:intact,warm,dry  I personally reviewed lab work that was obtained in last 24 hrs. CXR Independently reviewed  MEDICATIONS: I have reviewed all medications and confirmed regimen as documented   CULTURE RESULTS   Recent Results (from the past 240 hour(s))  SARS Coronavirus 2 [facility] order, Performed in [facility] hospital lab) Nasopharyngeal Nasopharyngeal Swab     Status: None   Collection Time: 06/07/19  6:34 PM   Specimen: Nasopharyngeal Swab  Result Value Ref Range Status   SARS Coronavirus 2 NEGATIVE NEGATIVE Final    Comment: (NOTE) If result is NEGATIVE SARS-CoV-2 target nucleic acids are NOT DETECTED. The SARS-CoV-2 RNA is generally detectable in upper and lower  respiratory specimens during the acute phase of infection. The lowest  concentration of SARS-CoV-2 viral copies this assay can detect is 250  copies / mL. A negative result does not preclude SARS-CoV-2 infection  and should not be used as the sole basis for treatment or other  patient management decisions.  A negative result may occur with  improper specimen collection / handling, submission of specimen other  than nasopharyngeal swab, presence of viral mutation(s) within the  areas targeted by this assay, and inadequate number of viral copies  (<250 copies / mL). A negative result must be combined with clinical  observations, patient history, and epidemiological information. If result is POSITIVE SARS-CoV-2 target nucleic acids are DETECTED. The SARS-CoV-2 RNA is generally detectable in upper and lower  respiratory specimens dur ing the acute phase of infection.  Positive  results are indicative of active infection with SARS-CoV-2.  Clinical  correlation with patient history and other diagnostic information is  necessary to determine patient infection status.  Positive results do  not rule out bacterial infection or co-infection with other  viruses. If result is PRESUMPTIVE POSTIVE SARS-CoV-2 nucleic acids MAY BE PRESENT.   A presumptive positive result was obtained on the submitted specimen  and confirmed on repeat testing.  While 2019 novel coronavirus  (SARS-CoV-2) nucleic acids may be present in the submitted sample  additional confirmatory testing may be necessary for epidemiological  and / or clinical management purposes  to differentiate between  SARS-CoV-2 and other Sarbecovirus currently known to infect humans.  If clinically indicated additional testing with an alternate test  methodology [phone]) is advised. The SARS-CoV-2 RNA is generally  detectable in upper and lower respiratory sp ecimens during the acute  phase of infection. The expected result is Negative. Fact Sheet for Patients:  StrictlyIdeas.no Fact Sheet for Healthcare Providers: BankingDealers.co.za This test is not yet approved or cleared by the [place] FDA and has been authorized for detection and/or diagnosis of SARS-CoV-2 by FDA under an Emergency Use Authorization (EUA).  This EUA will remain in effect (meaning this test can be used) for the duration of the COVID-19 declaration under Section 564(b)(1) of the Act, 21 U.S.C. section 360bbb-3(b)(1), unless the authorization is terminated or revoked sooner. Performed at [facility], [address] [place], [place] [address]   MRSA PCR Screening     Status: None   Collection Time: 06/08/19  5:53 AM   Specimen: Nasal Mucosa; Nasopharyngeal  Result Value Ref Range Status   MRSA by PCR NEGATIVE NEGATIVE Final  Comment:        The GeneXpert MRSA Assay (FDA approved for NASAL specimens only), is one component of a comprehensive MRSA colonization surveillance program. It is not intended to diagnose MRSA infection nor to guide or monitor treatment for MRSA infections. Performed at [facility], [address]  [place], [place] [address]           IMAGING    Dg Chest Port 1 View  Result Date: 06/12/2019 CLINICAL DATA:  PICC line placement EXAM: PORTABLE CHEST 1 VIEW COMPARISON:  One day prior FINDINGS: Cervical spine fixation. Endotracheal tube terminates 5.5 cm above carina. Nasogastric tube extends beyond the inferior aspect of the film. Right PICC line tip at low SVC or superior caval/atrial junction. Numerous leads and wires project over the chest. Normal heart size. No pleural effusion or pneumothorax. No lobar consolidation. IMPRESSION: Appropriate position of right-sided PICC line, without pneumothorax. Electronically Signed   By: Abigail Miyamoto M.D.   On: 06/12/2019 16:11   [place] Ekg Site Rite  Result Date: 06/12/2019 If Site Rite image not attached, placement could not be confirmed due to current cardiac rhythm.       Indwelling Urinary Catheter continued, requirement due to   Reason to continue Indwelling Urinary Catheter strict Intake/Output monitoring for hemodynamic instability   Central Line/ continued, requirement due to  Reason to continue [place] of central venous pressure or other hemodynamic parameters and poor IV access   Ventilator continued, requirement due to severe respiratory failure   Ventilator Sedation RASS 0 to -2      ASSESSMENT AND PLAN SYNOPSIS 53 yo male with significant psychiatric history presents to ICU in acute respiratory failure following agitation and aggression towards staff requiring sedation. Originally, pt here because of right ankle displaced fracture and limb ischemia requiring orthopedic and vascular surgery.  Severe ACUTE Hypoxic and Hypercapnic Respiratory Failure Highly suggestive of RT sided aspiration pneumonia -continue Full MV support -continue Bronchodilator Therapy -Wean Fio2 and PEEP as tolerated -will perform SAT/SBT when respiratory parameters are met  ETOH abuse with Severe Delirium Tremens -patient remains very  agitated  -needs ICU monitoring -CIWA protocol  NEUROLOGY - intubated and sedated - minimal sedation to achieve a RASS goal: -1 Wake up assessment - pt kicking and thrashing, likely to hurt staff and required increased sedation  CARDIAC ICU monitoring  GI GI PROPHYLAXIS as indicated  NUTRITIONAL STATUS DIET-->TF's as tolerated Constipation protocol as indicated  ENDO - will use ICU hypoglycemic\Hyperglycemia protocol if needed   INFECTIOUS DISEASE -start antibiotics  -follow up cultures    ELECTROLYTES -follow labs as needed -replace as needed -pharmacy consultation and following  DVT/GI PRX ordered TRANSFUSIONS AS NEEDED MONITOR FSBS ASSESS the need for LABS    Critical Care Time devoted to patient care services described in this note is 40 minutes.   Overall, patient is critically ill, prognosis is guarded. High risk for cardiac arrest and death.    Corrin Parker, M.D.  [facility]  Medical Director [place] Director [facility] Cardio-Pulmonary Department

## 2019-06-13 NOTE — Progress Notes (Signed)
[place] [facility] [place] NAME: Thomas [name]    MR#:  [id number]  DATE OF BIRTH:  1965/09/23  SUBJECTIVE:   Intubated remains on the ventilator. Mother in the room.  REVIEW OF SYSTEMS:   Review of Systems  Unable to perform ROS: Intubated   Tolerating Diet: Tolerating PT:   DRUG ALLERGIES:   Allergies  Allergen Reactions  . Haldol [Haloperidol Lactate]     VITALS:  Blood pressure (!) 80/59, pulse 73, temperature 97.8 F (36.6 C), temperature source Axillary, resp. rate 19, height 6' 0.01" (1.829 m), weight 76.6 kg, SpO2 100 %.  PHYSICAL EXAMINATION:   Physical Exam  GENERAL:  53 y.o.-year-old patient lying in the bed with no acute distress. Critically ill EYES: Pupils equal, round, reactive to light and accommodation. No scleral icteru HEENT: Head atraumatic, normocephalic. Oropharynx and nasopharynx clear.  NECK:  Supple, no jugular venous distention. No thyroid enlargement, no tenderness.  LUNGS: Normal breath sounds bilaterally, no wheezing, rales, rhonchi. No use of accessory muscles of respiration.  CARDIOVASCULAR: S1, S2 normal. No murmurs, rubs, or gallops.  ABDOMEN: Soft, nontender, nondistended. Bowel sounds present. No organomegaly or mass.  EXTREMITIES: No cyanosis, clubbing or edema b/l.    NEUROLOGIC: intubated PSYCHIATRIC: sedated intubated SKIN: No obvious rash, lesion, or ulcer.   LABORATORY PANEL:  CBC Recent Labs  Lab 06/13/19 0426  WBC 5.5  HGB 11.1*  HCT 34.6*  PLT 139*    Chemistries  Recent Labs  Lab 06/11/19 0443  06/13/19 0426  NA 139   < > 143  K 4.4   < > 3.3*  CL 109   < > 115*  CO2 22   < > 25  GLUCOSE 124*   < > 107*  BUN 17   < > 18  CREATININE 0.70   < > 0.51*  CALCIUM 8.3*   < > 7.0*  MG 1.7   < > 1.6*  AST 19  --   --   ALT 14  --   --   ALKPHOS 72  --   --   BILITOT 0.9  --   --    < > = values in this interval not displayed.   Cardiac Enzymes No results for  input(s): TROPONINI in the last 168 hours. RADIOLOGY:  Dg Abd 1 View  Result Date: 06/11/2019 CLINICAL DATA:  Oral gastric tube placement EXAM: ABDOMEN - 1 VIEW COMPARISON:  06/10/2019 FINDINGS: Lung bases are clear. Esophageal tube tip overlies the proximal stomach, side-port at the cardia of the stomach. Large stool in the upper abdomen. Coils to the right of low L2. IMPRESSION: Esophageal tube tip overlies the proximal stomach. Electronically Signed   By: Donavan Foil M.D.   On: 06/11/2019 23:27   Dg Chest Port 1 View  Result Date: 06/13/2019 CLINICAL DATA:  Hypoxia, smoker, hypertension EXAM: PORTABLE CHEST 1 VIEW COMPARISON:  Portable exam 1021 hours compared to 06/12/2019 FINDINGS: Tip of endotracheal tube projects 4.0 cm above carina. Nasogastric tube extends into stomach. RIGHT arm PICC line tip projects over superior RIGHT atrium. Normal heart size, mediastinal contours, and pulmonary vascularity. Developing opacity at RIGHT lower lobe question atelectasis versus infiltrate. Slight accentuation of interstitial markings bilaterally. No pleural effusion or pneumothorax. No acute osseous findings. IMPRESSION: Stable support lines/tubes. Developing atelectasis versus infiltrate at RIGHT base. Electronically Signed   By: Lavonia Dana M.D.   On: 06/13/2019 10:38   Dg Chest [facility]  Result Date: 06/12/2019 CLINICAL DATA:  PICC line placement EXAM: PORTABLE CHEST 1 VIEW COMPARISON:  One day prior FINDINGS: Cervical spine fixation. Endotracheal tube terminates 5.5 cm above carina. Nasogastric tube extends beyond the inferior aspect of the film. Right PICC line tip at low SVC or superior caval/atrial junction. Numerous leads and wires project over the chest. Normal heart size. No pleural effusion or pneumothorax. No lobar consolidation. IMPRESSION: Appropriate position of right-sided PICC line, without pneumothorax. Electronically Signed   By: Jeronimo Greaves M.D.   On: 06/12/2019 16:11   [place] Ekg Site  Rite  Result Date: 06/12/2019 If Site Rite image not attached, placement could not be confirmed due to current cardiac rhythm.  ASSESSMENT AND PLAN:   Thomas [name]  is a 53 y.o. male with a known history of polysubstance abuse including alcohol, schizoaffective bipolar type, seizures, hypertension and insomnia who initially presented on 06/07/2019 following an episode of a fall.  Was evaluated in the emergency room and found to have right ankle fracture as well as right foot ischemia.Went into alcohol withdrawal and currently sedated on the vent  1.  Acute hypoxic respiratory failure with acute encephalopathy with agitation  -Patient went into alcohol withdrawal requiring several sedatives and subsequently intubated for hypoxic respiratory failure -Remains sedated on the vent. -Being managed by ICU team.  2.  Right lower extremity ischemia -Patient status post revascularization with angioplasty and stent placement by vascular surgery in right superficial femoral artery, right external iliac artery. -Continue aspirin  3.  Right ankle fracture. Status post fall Patient status post open reduction with internal fixation of right ankle by orthopedic physician.  Day 5 postop Being managed by orthopedic surgery.  4.  History of schizoaffective bipolar type -Currently sedated on the vent. -Being followed by psychiatry service and managing psych meds.  5.  Alcohol abuse now with delirium tremens [Patient requiring sedation on the vent. [Continue thiamine, multivitamin and folic acid.  6. DVT prophylaxis; Lovenox  CODE STATUS: *full*  Spoke with patient's mother in the ICU room  TOTAL TIME TAKING CARE OF THIS PATIENT: *30* minutes.  >50% time spent on counselling and coordination of care  POSSIBLE D/C IN *few* DAYS, DEPENDING ON CLINICAL CONDITION.  Note: This dictation was prepared with Dragon dictation along with smaller phrase technology. Any transcriptional errors that result  from this process are unintentional.  Enedina Finner M.D on 06/13/2019 at 3:27 PM  Between 7am to 6pm - Pager - [phone]  After 6pm go to [email or web address] - password [employer]  [facility]  Office  [phone]  CC: Primary care physician; Patient, No Pcp PerPatient ID: Thomas [name], male   DOB: 1966-08-16, 53 y.o.   MRN: [id number]

## 2019-06-13 NOTE — Consult Note (Addendum)
PHARMACY CONSULT NOTE - FOLLOW UP  Pharmacy Consult for Electrolyte Monitoring and Replacement   53 y/o M admitted with right lower extremity ischemia and right ankle fracture now s/p peripheral revascularization and ORIF. Patient has a history significant for schizophrenia and substance abuse. Post-operatively he became extremely agitated likely secondary to drug/alcohol withdrawal requiring high doses of sedatives. He is currently intubated and sedated in the ICU.   Recent Labs: Potassium (mmol/L)  Date Value  06/13/2019 3.3 (L)   Magnesium (mg/dL)  Date Value  06/13/2019 1.6 (L)   Calcium (mg/dL)  Date Value  06/13/2019 7.0 (L)   Albumin (g/dL)  Date Value  06/11/2019 3.3 (L)   Phosphorus (mg/dL)  Date Value  06/13/2019 2.5   Sodium (mmol/L)  Date Value  06/13/2019 143    Electrolytes: Patient has a history of alcohol abuse. Baseline nutritional status un-clear. On tube feeds.Goal of therapy to replete electrolytes to normal levels. Patient with good UOP. Multiple bowel movements in last 12h. Sodium trending up. Phosphorous on lower end of normal today.    -60 mEq potassium VT    -Potassium phosphate 2 packets x2 VT   -4 g IV magnesium x1   -Start free water flushes 100 mL q4h   -BMP, Mg, Phos tomorrow AM  Constipation: Patient received bisacodyl suppository x1 yesterday. He has had multiple stool outputs documented in chart since then. Constipating medications include hydromorphone and doxepin. Rectal tube placed yesterday.     -Stop senna-docusate 2 tablets BID    -Continue bisacodyl suppository PRN  Glucose: No baseline A1c in chart and no history of diabetes recorded. Currently on tube feeds. Received one dose of methylprednisolone 125 mg on 9/27. Medications that could contribute to development of new-onset diabetes include quetiapine. Glucoses have been normal with a few mild hyperglycemic readings which may be associated with steroids.     -Continue to monitor  for hyper/hypoglycemia   [place] Resident 06/13/2019 11:18 AM

## 2019-06-13 NOTE — Consult Note (Signed)
Pharmacy Antibiotic Note  Thomas [name] is a 53 y.o. male admitted on 06/07/2019 with pneumonia. Patient admitted with limb ischemia and right ankle fracture now s/p peripheral revascularization and ORIF. Post-operatively patient became extremely agitated and aggressive/combative towards staff thought to be secondary to drug/alcohol withdrawal. He was subsequently intubated requiring high doses of sedation to maintain RASS goals. 9/30 CXR significant for atelectasis versus infiltrate at right base. No leukocytosis or fevers. Pharmacy has been consulted for Unasyn dosing.  Plan: Unasyn 3 g q6h   Continue to monitor clinical status and for s/sx of resolution of infection.   Height: 6' 0.01" (182.9 cm) Weight: 168 lb 14 oz (76.6 kg) IBW/kg (Calculated) : 77.62  Temp (24hrs), Avg:98.5 F (36.9 C), Min:97.5 F (36.4 C), Max:99.9 F (37.7 C)  Recent Labs  Lab 06/09/19 0411 06/10/19 0451 06/11/19 0443 06/11/19 1022 06/11/19 1222 06/12/19 0523 06/13/19 0426  WBC 6.4 6.9 5.1  --   --  7.0 5.5  CREATININE 0.65 0.55* 0.70  --   --  0.71 0.51*  LATICACIDVEN  --   --   --  0.9 1.1  --   --     Estimated Creatinine Clearance: 115.7 mL/min (A) (by C-G formula based on SCr of 0.51 mg/dL (L)).    Allergies  Allergen Reactions  . Haldol [Haloperidol Lactate]     Antimicrobials this admission: Cefazolin 9/25 >> 9/26 Clindamycin 9/25 >> 9/26 Unasyn 9/30 >>  Dose adjustments this admission: N/A  Microbiology results: 9/30 Sputum: pending 9/25 MRSA PCR: negative  Thank you for allowing pharmacy to be a part of this patient's care.  Benita Gutter 06/13/2019 2:29 PM

## 2019-06-14 LAB — BASIC METABOLIC PANEL
Anion gap: 4 — ABNORMAL LOW (ref 5–15)
BUN: 16 mg/dL (ref 6–20)
CO2: 27 mmol/L (ref 22–32)
Calcium: 7.4 mg/dL — ABNORMAL LOW (ref 8.9–10.3)
Chloride: 110 mmol/L (ref 98–111)
Creatinine, Ser: 0.46 mg/dL — ABNORMAL LOW (ref 0.61–1.24)
GFR calc Af Amer: >60 mL/min (ref >60)
GFR calc non Af Amer: >60 mL/min (ref >60)
Glucose, Bld: 116 mg/dL — ABNORMAL HIGH (ref 70–99)
Potassium: 3.6 mmol/L (ref 3.5–5.1)
Sodium: 141 mmol/L (ref 135–145)

## 2019-06-14 LAB — CBC WITH DIFFERENTIAL/PLATELET
Abs Immature Granulocytes: 0.01 10*3/uL (ref 0.00–0.07)
Basophils Absolute: 0 10*3/uL (ref 0.0–0.1)
Basophils Relative: 0 %
Eosinophils Absolute: 0.1 10*3/uL (ref 0.0–0.5)
Eosinophils Relative: 2 %
HCT: 31.9 % — ABNORMAL LOW (ref 39.0–52.0)
Hemoglobin: 10.3 g/dL — ABNORMAL LOW (ref 13.0–17.0)
Immature Granulocytes: 0 %
Lymphocytes Relative: 25 %
Lymphs Abs: 0.6 10*3/uL — ABNORMAL LOW (ref 0.7–4.0)
MCH: 33.4 pg (ref 26.0–34.0)
MCHC: 32.3 g/dL (ref 30.0–36.0)
MCV: 103.6 fL — ABNORMAL HIGH (ref 80.0–100.0)
Monocytes Absolute: 0.4 10*3/uL (ref 0.1–1.0)
Monocytes Relative: 15 %
Neutro Abs: 1.4 10*3/uL — ABNORMAL LOW (ref 1.7–7.7)
Neutrophils Relative %: 58 %
Platelets: 125 10*3/uL — ABNORMAL LOW (ref 150–400)
RBC: 3.08 MIL/uL — ABNORMAL LOW (ref 4.22–5.81)
RDW: 13.2 % (ref 11.5–15.5)
Smear Review: NORMAL
WBC: 2.5 10*3/uL — ABNORMAL LOW (ref 4.0–10.5)
nRBC: 0 % (ref 0.0–0.2)

## 2019-06-14 LAB — ECHOCARDIOGRAM COMPLETE
Height: 72.008 in
Weight: 2701.96 oz

## 2019-06-14 LAB — PHOSPHORUS: Phosphorus: 2.3 mg/dL — ABNORMAL LOW (ref 2.5–4.6)

## 2019-06-14 LAB — MAGNESIUM: Magnesium: 1.8 mg/dL (ref 1.7–2.4)

## 2019-06-14 MED ORDER — DOXEPIN HCL 10 MG/ML PO CONC: 75.0000 mg | Freq: Every day | ORAL | Status: DC

## 2019-06-14 MED ORDER — DOXEPIN HCL 10 MG/ML PO CONC: 50.0000 mg | Freq: Every day | ORAL | Status: DC

## 2019-06-14 MED ORDER — POTASSIUM & SODIUM PHOSPHATES 280-160-250 MG PO PACK
2.0000 | PACK | ORAL | Status: AC
  Administered 2019-06-14 – 2019-06-15 (×4): 2 via ORAL
  Filled 2019-06-14 (×6): qty 2

## 2019-06-14 MED ORDER — MAGNESIUM SULFATE 2 GM/50ML IV SOLN
2.0000 g | Freq: Once | INTRAVENOUS | Status: AC
  Administered 2019-06-14: 2 g via INTRAVENOUS
  Filled 2019-06-14: qty 50

## 2019-06-14 MED ORDER — DOXEPIN HCL 10 MG/ML PO CONC
100.0000 mg | Freq: Every day | ORAL | Status: DC
  Administered 2019-06-19: 100 mg
  Filled 2019-06-14 (×2): qty 10

## 2019-06-14 MED ORDER — DOXEPIN HCL 10 MG/ML PO CONC
125.0000 mg | Freq: Every day | ORAL | Status: AC
  Administered 2019-06-14 – 2019-06-18 (×5): 125 mg
  Filled 2019-06-14 (×5): qty 12.5

## 2019-06-14 MED ORDER — DOXEPIN HCL 10 MG/ML PO CONC: 25.0000 mg | Freq: Every day | ORAL | Status: DC

## 2019-06-14 MED ORDER — POTASSIUM CHLORIDE 20 MEQ PO PACK
20.0000 meq | PACK | Freq: Once | ORAL | Status: AC
  Administered 2019-06-14: 20 meq
  Filled 2019-06-14: qty 1

## 2019-06-14 NOTE — Progress Notes (Signed)
[facility] Daily Progress Note   Subjective: 6 Days Post-Op: 1. Introduction catheter intorightlower extremity 3rd order catheter placement  2.Contrast injectionrightlower extremity for distal runoff  3. Percutaneous transluminal angioplasty and stent placementrightsuperficial femoral artery  4. Percutaneous transluminal angioplasty and stent placement right external iliac artery 5.Star close closureleftcommon femoral arteriotomy  Intubated and sedated this AM. No family at bedside.   Objective: Vitals:   06/14/19 0600 06/14/19 0700 06/14/19 0800 06/14/19 0819  BP: (!) 88/66 92/61 90/71    Pulse: (!) 105 (!) 103 (!) 102   Resp:      Temp:   97.6 F (36.4 C)   TempSrc:   Axillary   SpO2: 100% 100% 100% 100%  Weight:      Height:        Intake/Output Summary (Last 24 hours) at 06/14/2019 0953 Last data filed at 06/14/2019 0921 Gross per 24 hour  Intake 1492.26 ml  Output 2315 ml  Net -822.74 ml   Physical Exam: Intubated and sedated. NAD CV: RRR Pulmonary: CTA Bilaterally Abdomen: Soft, Nontender, Nondistended Vascular: Right Lower Extremity: Thigh soft. Unable to access pedal pulses due to cast however toes are warm with a good capillary refill.   Laboratory: CBC    Component Value Date/Time   WBC 2.5 (L) 06/14/2019 0507   HGB 10.3 (L) 06/14/2019 0507   HCT 31.9 (L) 06/14/2019 0507   PLT 125 (L) 06/14/2019 0507   BMET    Component Value Date/Time   NA 141 06/14/2019 0507   K 3.6 06/14/2019 0507   CL 110 06/14/2019 0507   CO2 27 06/14/2019 0507   GLUCOSE 116 (H) 06/14/2019 0507   BUN 16 06/14/2019 0507   CREATININE 0.46 (L) 06/14/2019 0507   CALCIUM 7.4 (L) 06/14/2019 0507   GFRNONAA >60 06/14/2019 0507   GFRAA >60 06/14/2019 0507   Assessment/Planning: The patient is a 53 year old male who presented with right ankle fracture and right lower  extremity ischemia s/p lower extremity revascularization and open reduction internal fixation ankle fracture with severe alcohol withdrawal now in DTs requiring sedation and subsequent intubation  1) Appreciate assistance in management from ICU, psychiatry and medicine  2) Successful right lower extremity revascularization - no further recommendation from vascular at this time. Will continue to monitor  Discussed with Dr. Eber Hong [facility] PA-C 06/14/2019 9:53 AM

## 2019-06-14 NOTE — Progress Notes (Signed)
Nutrition Follow-up  DOCUMENTATION CODES:   Not applicable  INTERVENTION:  Continue Vital 1.5 Cal at 45 mL/hr (1080 mL goal daily volume) + Pro-Stat 30 mL TID per tube. Provides 1920 kcal, 118 grams of protein, 821 mL H2O daily.   With free water flush of 100 mL Q4hrs patient is receiving a total of 1421 mL H2O daily including water in tube feeding.  Continue liquid MVI daily per tube, folic acid 1 mg daily, thiamine 100 mg daily.  Recommend measuring daily weights.  NUTRITION DIAGNOSIS:   Inadequate oral intake related to inability to eat(pt sedated and ventilated) as evidenced by NPO status.  Ongoing - addressing with TF regimen.  GOAL:   Provide needs based on ASPEN/SCCM guidelines  Met with TF regimen.  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 Pt now s/p vascular repair 9/24 and ORIF 9/25. Pt devloped withdrawl symptoms and agitation on 9/27 requiring intubation  Patient remains intubated and sedated. On PRVC mode with FiO2 40% and PEEP 5 cmH2O. Abdomen soft. Patient having medium type 7 BMs per rectal tube. Patient tolerating tube feeds.  Enteral Access: 18 Fr. OGT placed 9/28; still terminates in stomach per chest x-ray 9/30; 60 cm at corner of mouth  MAP: 70-79 mmHg  TF: Vital 1.5 Cal at 45 mL/hr + Pro-Stat 30 mL TID + free water flush 100 mL Q4hrs  Patient is currently intubated on ventilator support Ve: 9.7 L/min Temp (24hrs), Avg:98.3 F (36.8 C), Min:97.6 F (36.4 C), Max:99.3 F (37.4 C)  Propofol: N/A  Medications reviewed and include: famotidine, folic acid 1 mg  daily, liquid MVI daily per tube, Seroquel, thiamine 100 mg daily per tube, Unasyn, Dilaudid gtt, Versed gtt, phenylephrine gtt at 25 mcg/min.  Labs reviewed: Anion gap 4, Creatinine 0.46, Phosphorus 2.3.  I/O: 2150 mL UOP yesterday (1.2 mL/kg/hr)  No weight taken since admission to trend.  Discussed with RN and on rounds.  Diet Order:   Diet Order    None     EDUCATION NEEDS:   Not appropriate for education at this time  Skin:  Skin Assessment: Skin Integrity Issues:(closed incision right ankle; ecchymosis)  Last BM:  06/14/2019 - medium type 7 per rectal tube  Height:   Ht Readings from Last 1 Encounters:  06/13/19 6' 0.01" (1.829 m)   Weight:   Wt Readings from Last 1 Encounters:  06/07/19 76.6 kg   Ideal Body Weight:  80.9 kg  BMI:  Body mass index is 22.9 kg/m.  Estimated Nutritional Needs:   Kcal:  1921 (PSU 2003b w/ MSJ 1653, Ve 9.7, Tmax 37.4)  Protein:  115-130g/day  Fluid:  2.3L/day  Willey Blade, MS, RD, LDN Office: [phone] Pager: [phone] After Hours/Weekend Pager: [phone]

## 2019-06-14 NOTE — Consult Note (Signed)
Pharmacy Antibiotic Note  Thomas [name] is a 53 y.o. male admitted on 06/07/2019 with pneumonia. Patient admitted with limb ischemia and right ankle fracture now s/p peripheral revascularization and ORIF. Post-operatively patient became extremely agitated and aggressive/combative towards staff thought to be secondary to drug/alcohol withdrawal. He was subsequently intubated requiring high doses of sedation to maintain RASS goals. 9/30 CXR significant for atelectasis versus infiltrate at right base. No leukocytosis or fevers. Pharmacy consulted for Unasyn dosing.   Plan: Continue Unasyn 3 g q6h   Continue to monitor clinical status and for s/sx of resolution of infection.   Height: 6' 0.01" (182.9 cm) Weight: 168 lb 14 oz (76.6 kg) IBW/kg (Calculated) : 77.62  Temp (24hrs), Avg:98.3 F (36.8 C), Min:97.6 F (36.4 C), Max:99.3 F (37.4 C)  Recent Labs  Lab 06/10/19 0451 06/11/19 0443 06/11/19 1022 06/11/19 1222 06/12/19 0523 06/13/19 0426 06/14/19 0507  WBC 6.9 5.1  --   --  7.0 5.5 2.5*  CREATININE 0.55* 0.70  --   --  0.71 0.51* 0.46*  LATICACIDVEN  --   --  0.9 1.1  --   --   --     Estimated Creatinine Clearance: 115.7 mL/min (A) (by C-G formula based on SCr of 0.46 mg/dL (L)).    Allergies  Allergen Reactions  . Haldol [Haloperidol Lactate]     Antimicrobials this admission: Cefazolin 9/25 >> 9/26 Clindamycin 9/25 >> 9/26 Unasyn 9/30 >>  Dose adjustments this admission: N/A  Microbiology results: 9/30 Sputum: Gram stain: abundant WBC present, abundant gram positive cocci, rare gram negative rods 9/25 MRSA PCR: negative  Thank you for allowing pharmacy to be a part of this patient's care.  Sallye Lat 06/14/2019 11:27 AM

## 2019-06-14 NOTE — Consult Note (Addendum)
PHARMACY CONSULT NOTE - FOLLOW UP  Pharmacy Consult for Electrolyte Monitoring and Replacement   53 y/o M admitted with right lower extremity ischemia and right ankle fracture now s/p peripheral revascularization and ORIF. Patient has a history significant for schizophrenia and substance abuse. Post-operatively he became extremely agitated likely secondary to drug/alcohol withdrawal requiring high doses of sedatives. He is currently intubated and sedated in the ICU.   Recent Labs: Potassium (mmol/L)  Date Value  06/14/2019 3.6   Magnesium (mg/dL)  Date Value  06/14/2019 1.8   Calcium (mg/dL)  Date Value  06/14/2019 7.4 (L)   Albumin (g/dL)  Date Value  06/11/2019 3.3 (L)   Phosphorus (mg/dL)  Date Value  06/14/2019 2.3 (L)   Sodium (mmol/L)  Date Value  06/14/2019 141    Electrolytes: Patient has a history of alcohol abuse. Baseline nutritional status un-clear. On tube feeds.Goal of therapy to replete electrolytes to normal levels. Patient with good UOP. Multiple bowel movements in last 12h. Sodium trending up. Phosphorous on lower end of normal today.    -Potassium chloride packet 27mEq   -Potassium phosphate 2 packets q4h x4 VT   -2 g IV magnesium x1   -Continue free water flushes 100 mL q4h   -BMP, Mg, Phos tomorrow AM  Constipation: Patient received bisacodyl suppository x1 9/29. He has had multiple stool outputs documented in chart since then. Constipating medications include hydromorphone and doxepin. Per discussion on rounds, plan to taper doxepin dose by 25% every 5 days. Rectal tube was placed on 09/29.     -Continue bisacodyl suppository PRN  Glucose: No baseline A1c in chart and no history of diabetes recorded. Currently on tube feeds. Received one dose of methylprednisolone 125 mg on 9/27. Medications that could contribute to development of new-onset diabetes include quetiapine. Glucoses have been normal with a few mild hyperglycemic readings which may be  associated with steroids.     -Continue to monitor for hyper/hypoglycemia   Maynard Resident 06/14/2019 11:38 AM

## 2019-06-14 NOTE — Progress Notes (Addendum)
Name: Thomas [name] MRN: [id number] DOB: 1966/03/19     CONSULTATION DATE: 06/14/2019  CHIEF COMPLAINT:  Respiratory failure  SYNOPSIS:  Thomas [name] is a 53 yo male with an extensive psychiatric history and polysubstance abuse presented to the ED following a fall at home with right ankle fracture and right lower extremity ischemia and inability to locate pulse with doppler. After admission to the ICU, pt began exhibiting sx of drug/alchol withdrawal syndrome requiring multiple sedatives and leading to respiratory failure requiring intubation 9/27. CXR suspicious for right lower lobe pneumonia.  SIGNIFICANT EVENTS: 9/24- Presented to ED with displaced right ankle fracture and limb ischemia 9/24- Vascular surgery to revascularize right lower extremity 9/25- Open reduction internal fixation of right ankle fracture medially and laterally 9/26- patient on precedex and multiple PRN IV meds still tachycardic with episodes of aggitation and combative/aggresive behavior. Concern for loss of airway protection 9/27- patient attempting to physically attack staff and loudly verbally abusive, he is in high doses of IV sedation. After additional PRN IV medications patient temporarilly sedated with episodic apnea. Pt sedated and intubated. 9/28 failed SAT due to severe agitation and delirium 9/29 PICC line placed 9/30 failed SAT again for severe agitation and delirium  9/30 CXR suspicious for right lower lobe pneumonia, began treatment with Unasyn   HISTORY OF PRESENT ILLNESS:   Pt currently on vent support and sedated. Continued SAT daily to reassess aggression.  PAST MEDICAL HISTORY :   has a past medical history of Alcohol abuse, Anemia, Bipolar 1 disorder ([place]), Cervical spine fracture ([place]), Chronic, continuous use of opioids, History of seizure, HTN (hypertension), Insomnia, MDD (major depressive disorder), recurrent, severe, with psychosis ([place]), and Pancreatitis.  has a past  surgical history that includes Lower Extremity Angiography (Right, 06/07/2019) and ORIF ankle fracture (Right, 06/08/2019). Prior to Admission medications   Medication Sig Start Date End Date Taking? Authorizing Provider  albuterol (VENTOLIN HFA) 108 (90 Base) MCG/ACT inhaler Inhale 2 puffs into the lungs every 6 (six) hours as needed for wheezing. 02/24/19  Yes [provider]  doxepin (SINEQUAN) 100 MG capsule Take 200 mg by mouth at bedtime. 04/26/19  Yes [provider]  pregabalin (LYRICA) 200 MG capsule Take 200 mg by mouth 3 (three) times daily. 01/25/19  Yes [provider]  SUBOXONE 8-2 MG FILM Place 1 Film under the tongue 3 (three) times daily. 05/22/19  Yes [provider]   Allergies  Allergen Reactions   Haldol [Haloperidol Lactate]    REVIEW OF SYSTEMS:   Unable to obtain due to critical illness  VITAL SIGNS: Temp:  [97.8 F (36.6 C)-99.3 F (37.4 C)] 98.2 F (36.8 C) (10/01 0400) Pulse Rate:  [28-130] 105 (10/01 0600) BP: (59-220)/(45-123) 88/66 (10/01 0600) SpO2:  [91 %-100 %] 100 % (10/01 0600) FiO2 (%):  [40 %-50 %] 40 % (10/01 0403)   I/O last 3 completed shifts: In: 5351.9 [I.V.:2825; NG/GT:1825.5; IV Piggyback:701.4] Out: 8453 [Urine:3125; Stool:150] No intake/output data recorded.   SpO2: 100 % O2 Flow Rate (L/min): 6 L/min FiO2 (%): 40 %  Vent Mode: PRVC FiO2 (%):  [40 %-50 %] 40 % Set Rate:  [16 bmp] 16 bmp Vt Set:  [500 mL] 500 mL PEEP:  [10 cmH20] 10 cmH20   Physical Examination:  GENERAL:critically ill appearing, intubated and sedated HEAD: Normocephalic, atraumatic.  EYES: Pupils equal, round, reactive to light.  No scleral icterus.  MOUTH: Moist mucosal membrane. NECK: Supple. No JVD.  PULMONARY: lungs clear to auscultation  CARDIOVASCULAR: S1 and S2. Regular rate and rhythm. No murmurs, rubs, or gallops.  GASTROINTESTINAL: Soft, nontender, -distended. No masses. Positive bowel sounds. No hepatosplenomegaly.   MUSCULOSKELETAL: No swelling, clubbing, or edema.  NEUROLOGIC: obtunded, RASS -4 SKIN:intact,warm,dry  I personally reviewed lab work that was obtained in last 24 hrs. CXR Independently reviewed  MEDICATIONS: I have reviewed all medications and confirmed regimen as documented   CULTURE RESULTS   Recent Results (from the past 240 hour(s))  SARS Coronavirus 2 [facility] order, Performed in [facility] hospital lab) Nasopharyngeal Nasopharyngeal Swab     Status: None   Collection Time: 06/07/19  6:34 PM   Specimen: Nasopharyngeal Swab  Result Value Ref Range Status   SARS Coronavirus 2 NEGATIVE NEGATIVE Final    Comment: (NOTE) If result is NEGATIVE SARS-CoV-2 target nucleic acids are NOT DETECTED. The SARS-CoV-2 RNA is generally detectable in upper and lower  respiratory specimens during the acute phase of infection. The lowest  concentration of SARS-CoV-2 viral copies this assay can detect is 250  copies / mL. A negative result does not preclude SARS-CoV-2 infection  and should not be used as the sole basis for treatment or other  patient management decisions.  A negative result may occur with  improper specimen collection / handling, submission of specimen other  than nasopharyngeal swab, presence of viral mutation(s) within the  areas targeted by this assay, and inadequate number of viral copies  (<250 copies / mL). A negative result must be combined with clinical  observations, patient history, and epidemiological information. If result is POSITIVE SARS-CoV-2 target nucleic acids are DETECTED. The SARS-CoV-2 RNA is generally detectable in upper and lower  respiratory specimens dur ing the acute phase of infection.  Positive  results are indicative of active infection with SARS-CoV-2.  Clinical  correlation with patient history and other diagnostic information is  necessary to determine patient infection status.  Positive results do  not rule out bacterial infection or  co-infection with other viruses. If result is PRESUMPTIVE POSTIVE SARS-CoV-2 nucleic acids MAY BE PRESENT.   A presumptive positive result was obtained on the submitted specimen  and confirmed on repeat testing.  While 2019 novel coronavirus  (SARS-CoV-2) nucleic acids may be present in the submitted sample  additional confirmatory testing may be necessary for epidemiological  and / or clinical management purposes  to differentiate between  SARS-CoV-2 and other Sarbecovirus currently known to infect humans.  If clinically indicated additional testing with an alternate test  methodology [id number]) is advised. The SARS-CoV-2 RNA is generally  detectable in upper and lower respiratory sp ecimens during the acute  phase of infection. The expected result is Negative. Fact Sheet for Patients:  StrictlyIdeas.no Fact Sheet for Healthcare Providers: BankingDealers.co.za This test is not yet approved or cleared by the [place] FDA and has been authorized for detection and/or diagnosis of SARS-CoV-2 by FDA under an Emergency Use Authorization (EUA).  This EUA will remain in effect (meaning this test can be used) for the duration of the COVID-19 declaration under Section 564(b)(1) of the Act, 21 U.S.C. section 360bbb-3(b)(1), unless the authorization is terminated or revoked sooner. Performed at [facility], [address] [place], [place] [address]   MRSA PCR Screening     Status: None   Collection Time: 06/08/19  5:53 AM   Specimen: Nasal Mucosa; Nasopharyngeal  Result Value Ref Range Status   MRSA by PCR NEGATIVE NEGATIVE Final    Comment:  The GeneXpert MRSA Assay (FDA approved for NASAL specimens only), is one component of a comprehensive MRSA colonization surveillance program. It is not intended to diagnose MRSA infection nor to guide or monitor treatment for MRSA infections. Performed at [facility],  [address] [place], [place] [address]   Culture, respiratory     Status: None (Preliminary result)   Collection Time: 06/13/19  3:28 PM   Specimen: Tracheal Aspirate  Result Value Ref Range Status   Specimen Description   Final    TRACHEAL ASPIRATE Performed at [facility], [address], [place], [place] [address]    Special Requests   Final    NONE Performed at [facility], [address] [place], [place] [address]    Gram Stain   Final    ABUNDANT WBC PRESENT, PREDOMINANTLY PMN ABUNDANT GRAM POSITIVE COCCI RARE GRAM NEGATIVE RODS Performed at [facility], [place] [address], [place], [place] [address]    Culture PENDING  Incomplete   Report Status PENDING  Incomplete          IMAGING    Dg Chest Port 1 View  Result Date: 06/13/2019 CLINICAL DATA:  Hypoxia, smoker, hypertension EXAM: PORTABLE CHEST 1 VIEW COMPARISON:  Portable exam 1021 hours compared to 06/12/2019 FINDINGS: Tip of endotracheal tube projects 4.0 cm above carina. Nasogastric tube extends into stomach. RIGHT arm PICC line tip projects over superior RIGHT atrium. Normal heart size, mediastinal contours, and pulmonary vascularity. Developing opacity at RIGHT lower lobe question atelectasis versus infiltrate. Slight accentuation of interstitial markings bilaterally. No pleural effusion or pneumothorax. No acute osseous findings. IMPRESSION: Stable support lines/tubes. Developing atelectasis versus infiltrate at RIGHT base. Electronically Signed   By: Lavonia Dana M.D.   On: 06/13/2019 10:38        Indwelling Urinary Catheter continued, requirement due to   Reason to continue Indwelling Urinary Catheter strict Intake/Output monitoring for hemodynamic instability   Central Line/ continued, requirement due to  Reason to continue [place] of central venous pressure or other hemodynamic parameters and poor IV access   Ventilator continued, requirement due to severe  respiratory failure   Ventilator Sedation RASS 0 to -2      ASSESSMENT AND PLAN SYNOPSIS 53 yo male with significant psychiatric history presents to ICU in acute respiratory failure following agitation and aggression towards staff requiring sedation. Respiratory failure likely also from right lower lobe aspiration pneumonia. Originally, pt here because of right ankle displaced fracture and limb ischemia requiring orthopedic and vascular surgery.  Severe ACUTE Hypoxic Respiratory Failure CXR highly suggestive of RLL aspiration pneumonia -continue Full MV support -continue Bronchodilator Therapy -Wean Fio2 and PEEP as tolerated -will perform SAT/SBT when respiratory parameters are met  Right lower lobe aspiration pneumonia -continue IV abx as prescibed -follow up cultures  ETOH abuse with Severe Delirium Tremens -patient remains very agitated -needs ICU monitoring -CIWA protocol  NEUROLOGY - intubated and sedated - minimal sedation to achieve a RASS goal: -1 Wake up assessment pending Severe underlying Psychiatric illness Follow  psych recs   CARDIAC ICU monitoring Monitor QT prolongation  GI GI PROPHYLAXIS as indicated  NUTRITIONAL STATUS DIET-->TF's as tolerated Constipation protocol as indicated  ENDO - will use ICU hypoglycemic\Hyperglycemia protocol if needed  ELECTROLYTES -follow labs as needed -replace as needed -pharmacy consultation and following  DVT/GI PRX ordered TRANSFUSIONS AS NEEDED MONITOR FSBS ASSESS the need for LABS    Critical Care Time devoted to patient care services  described in this note is 35 minutes.   Overall, patient is critically ill, prognosis is guarded.  Patient with Multiorgan failure and at high risk for cardiac arrest and death.    Corrin Parker, M.D.  [facility]  Medical Director [facility] Director [facility] Cardio-Pulmonary Department

## 2019-06-14 NOTE — Progress Notes (Signed)
[facility] [place] NAME: Thomas [name]    MR#:  [id number]  DATE OF BIRTH:  Jan 30, 1966  SUBJECTIVE:   Intubated remains on the ventilator IV Dilaudid, neosynephrine and Precedex gtt REVIEW OF SYSTEMS:   Review of Systems  Unable to perform ROS: Intubated   Tolerating Diet: Tolerating PT:   DRUG ALLERGIES:   Allergies  Allergen Reactions  . Haldol [Haloperidol Lactate]     VITALS:  Blood pressure (!) 85/63, pulse 88, temperature 97.8 F (36.6 C), temperature source Axillary, resp. rate 19, height 6' 0.01" (1.829 m), weight 76.6 kg, SpO2 100 %.  PHYSICAL EXAMINATION:   Physical Exam  GENERAL:  53 y.o.-year-old patient lying in the bed with no acute distress Critically ill EYES: Pupils equal, round, reactive to light and accommodation. No scleral icteru HEENT: Head atraumatic, normocephalic. Oropharynx and nasopharynx clear.  NECK:  Supple, no jugular venous distention. No thyroid enlargement, no tenderness.  LUNGS: Normal breath sounds bilaterally, no wheezing, rales, rhonchi. No use of accessory muscles of respiration.  CARDIOVASCULAR: S1, S2 normal. No murmurs, rubs, or gallops.  ABDOMEN: Soft, nontender, nondistended. Bowel sounds present. No organomegaly or mass.  EXTREMITIES: No cyanosis, clubbing or edema b/l.    NEUROLOGIC: intubated  PSYCHIATRIC: sedated and intubated SKIN: No obvious rash, lesion, or ulcer.   LABORATORY PANEL:  CBC Recent Labs  Lab 06/14/19 0507  WBC 2.5*  HGB 10.3*  HCT 31.9*  PLT 125*    Chemistries  Recent Labs  Lab 06/11/19 0443  06/14/19 0507  NA 139   < > 141  K 4.4   < > 3.6  CL 109   < > 110  CO2 22   < > 27  GLUCOSE 124*   < > 116*  BUN 17   < > 16  CREATININE 0.70   < > 0.46*  CALCIUM 8.3*   < > 7.4*  MG 1.7   < > 1.8  AST 19  --   --   ALT 14  --   --   ALKPHOS 72  --   --   BILITOT 0.9  --   --    < > = values in this interval not displayed.   Cardiac  Enzymes No results for input(s): TROPONINI in the last 168 hours. RADIOLOGY:  Dg Chest Port 1 View  Result Date: 06/13/2019 CLINICAL DATA:  Hypoxia, smoker, hypertension EXAM: PORTABLE CHEST 1 VIEW COMPARISON:  Portable exam 1021 hours compared to 06/12/2019 FINDINGS: Tip of endotracheal tube projects 4.0 cm above carina. Nasogastric tube extends into stomach. RIGHT arm PICC line tip projects over superior RIGHT atrium. Normal heart size, mediastinal contours, and pulmonary vascularity. Developing opacity at RIGHT lower lobe question atelectasis versus infiltrate. Slight accentuation of interstitial markings bilaterally. No pleural effusion or pneumothorax. No acute osseous findings. IMPRESSION: Stable support lines/tubes. Developing atelectasis versus infiltrate at RIGHT base. Electronically Signed   By: Lavonia Dana M.D.   On: 06/13/2019 10:38   Dg Chest Port 1 View  Result Date: 06/12/2019 CLINICAL DATA:  PICC line placement EXAM: PORTABLE CHEST 1 VIEW COMPARISON:  One day prior FINDINGS: Cervical spine fixation. Endotracheal tube terminates 5.5 cm above carina. Nasogastric tube extends beyond the inferior aspect of the film. Right PICC line tip at low SVC or superior caval/atrial junction. Numerous leads and wires project over the chest. Normal heart size. No pleural effusion or pneumothorax. No lobar consolidation. IMPRESSION: Appropriate position of right-sided  PICC line, without pneumothorax. Electronically Signed   By: Jeronimo Greaves M.D.   On: 06/12/2019 16:11   ASSESSMENT AND PLAN:   Thomas [name]  is a 53 y.o. male with a known history of polysubstance abuse including alcohol, schizoaffective bipolar type, seizures, hypertension and insomnia who initially presented on 06/07/2019 following an episode of a fall.  Was evaluated in the emergency room and found to have right ankle fracture as well as right foot ischemia.Went into alcohol withdrawal and currently sedated on the vent  1. Acute hypoxic  respiratory failure with acute encephalopathy with agitation  -Patient went into alcohol withdrawal requiring several sedatives and subsequently intubated for hypoxic respiratory failure -Remains sedated on the vent. -Being managed by ICU team.  2.  Right lower extremity ischemia -Patient status post revascularization with angioplasty and stent placement by vascular surgery in right superficial femoral artery, right external iliac artery. -Continue aspirin  3. Right ankle fracture. Status post fall Patient status post open reduction with internal fixation of right ankle by orthopedic physician.  Day 6 postop Being managed by orthopedic surgery.  4.  History of schizoaffective bipolar type -Currently sedated on the vent. -Being followed by psychiatry service and managing psych meds.  5.  Alcohol abuse now with delirium tremens [Patient requiring sedation on the vent. [Continue thiamine, multivitamin and folic acid.  6. DVT prophylaxis; Lovenox  CODE STATUS: *full*    TOTAL TIME TAKING CARE OF THIS PATIENT: *30* minutes.  >50% time spent on counselling and coordination of care  POSSIBLE D/C IN *few* DAYS, DEPENDING ON CLINICAL CONDITION.  Note: This dictation was prepared with Dragon dictation along with smaller phrase technology. Any transcriptional errors that result from this process are unintentional.  Enedina Finner M.D on 06/14/2019 at 12:50 PM  Between 7am to 6pm - Pager - [phone]  After 6pm go to [email or web address] - password [email or web address]  [facility]  Office  [phone]  CC: Primary care physician; Patient, No Pcp PerPatient ID: Thomas [name], male   DOB: 1966-01-25, 53 y.o.   MRN: [id number]

## 2019-06-15 ENCOUNTER — Inpatient Hospital Stay: Payer: Medicaid Other

## 2019-06-15 LAB — COMPREHENSIVE METABOLIC PANEL
ALT: 28 U/L (ref 0–44)
AST: 26 U/L (ref 15–41)
Albumin: 2.7 g/dL — ABNORMAL LOW (ref 3.5–5.0)
Alkaline Phosphatase: 92 U/L (ref 38–126)
Anion gap: 9 (ref 5–15)
BUN: 13 mg/dL (ref 6–20)
CO2: 27 mmol/L (ref 22–32)
Calcium: 8.3 mg/dL — ABNORMAL LOW (ref 8.9–10.3)
Chloride: 101 mmol/L (ref 98–111)
Creatinine, Ser: 0.6 mg/dL — ABNORMAL LOW (ref 0.61–1.24)
GFR calc Af Amer: >60 mL/min (ref >60)
GFR calc non Af Amer: >60 mL/min (ref >60)
Glucose, Bld: 127 mg/dL — ABNORMAL HIGH (ref 70–99)
Potassium: 4 mmol/L (ref 3.5–5.1)
Sodium: 137 mmol/L (ref 135–145)
Total Bilirubin: 0.8 mg/dL (ref 0.3–1.2)
Total Protein: 6.3 g/dL — ABNORMAL LOW (ref 6.5–8.1)

## 2019-06-15 LAB — CBC WITH DIFFERENTIAL/PLATELET
Abs Immature Granulocytes: 0.02 10*3/uL (ref 0.00–0.07)
Basophils Absolute: 0 10*3/uL (ref 0.0–0.1)
Basophils Relative: 0 %
Eosinophils Absolute: 0.1 10*3/uL (ref 0.0–0.5)
Eosinophils Relative: 3 %
HCT: 29.8 % — ABNORMAL LOW (ref 39.0–52.0)
Hemoglobin: 9.6 g/dL — ABNORMAL LOW (ref 13.0–17.0)
Immature Granulocytes: 0 %
Lymphocytes Relative: 29 %
Lymphs Abs: 1.3 10*3/uL (ref 0.7–4.0)
MCH: 33.7 pg (ref 26.0–34.0)
MCHC: 32.2 g/dL (ref 30.0–36.0)
MCV: 104.6 fL — ABNORMAL HIGH (ref 80.0–100.0)
Monocytes Absolute: 0.7 10*3/uL (ref 0.1–1.0)
Monocytes Relative: 15 %
Neutro Abs: 2.3 10*3/uL (ref 1.7–7.7)
Neutrophils Relative %: 53 %
Platelets: 130 10*3/uL — ABNORMAL LOW (ref 150–400)
RBC: 2.85 MIL/uL — ABNORMAL LOW (ref 4.22–5.81)
RDW: 13.3 % (ref 11.5–15.5)
Smear Review: NORMAL
WBC: 4.5 10*3/uL (ref 4.0–10.5)
nRBC: 0 % (ref 0.0–0.2)

## 2019-06-15 LAB — MAGNESIUM
Magnesium: 1.7 mg/dL (ref 1.7–2.4)
Magnesium: 2 mg/dL (ref 1.7–2.4)

## 2019-06-15 LAB — PHOSPHORUS
Phosphorus: 3.2 mg/dL (ref 2.5–4.6)
Phosphorus: 3.4 mg/dL (ref 2.5–4.6)

## 2019-06-15 LAB — BASIC METABOLIC PANEL
Anion gap: 5 (ref 5–15)
BUN: 13 mg/dL (ref 6–20)
CO2: 28 mmol/L (ref 22–32)
Calcium: 7.5 mg/dL — ABNORMAL LOW (ref 8.9–10.3)
Chloride: 106 mmol/L (ref 98–111)
Creatinine, Ser: 0.55 mg/dL — ABNORMAL LOW (ref 0.61–1.24)
GFR calc Af Amer: >60 mL/min (ref >60)
GFR calc non Af Amer: >60 mL/min (ref >60)
Glucose, Bld: 98 mg/dL (ref 70–99)
Potassium: 3.7 mmol/L (ref 3.5–5.1)
Sodium: 139 mmol/L (ref 135–145)

## 2019-06-15 MED ORDER — SODIUM CHLORIDE 0.9 % IV BOLUS
500.0000 mL | Freq: Once | INTRAVENOUS | Status: AC
  Administered 2019-06-15: 23:00:00 500 mL via INTRAVENOUS

## 2019-06-15 MED ORDER — DEXMEDETOMIDINE HCL IN NACL 400 MCG/100ML IV SOLN
0.4000 ug/kg/h | INTRAVENOUS | Status: DC
  Administered 2019-06-15: 1 ug/kg/h via INTRAVENOUS
  Administered 2019-06-15 (×2): 1.2 ug/kg/h via INTRAVENOUS
  Administered 2019-06-15: 0.6 ug/kg/h via INTRAVENOUS
  Administered 2019-06-16 (×6): 1.2 ug/kg/h via INTRAVENOUS
  Administered 2019-06-17: 1 ug/kg/h via INTRAVENOUS
  Administered 2019-06-17: 1.201 ug/kg/h via INTRAVENOUS
  Administered 2019-06-17 – 2019-06-18 (×7): 1.2 ug/kg/h via INTRAVENOUS
  Administered 2019-06-19: 1.5 ug/kg/h via INTRAVENOUS
  Administered 2019-06-19: 0.6 ug/kg/h via INTRAVENOUS
  Administered 2019-06-19: 1.5 ug/kg/h via INTRAVENOUS
  Administered 2019-06-19: 0.8 ug/kg/h via INTRAVENOUS
  Administered 2019-06-20 (×3): 1.5 ug/kg/h via INTRAVENOUS
  Administered 2019-06-20: 08:00:00 0.5 ug/kg/h via INTRAVENOUS
  Administered 2019-06-21 (×6): 1.5 ug/kg/h via INTRAVENOUS
  Administered 2019-06-22: 1 ug/kg/h via INTRAVENOUS
  Administered 2019-06-22: 1.3 ug/kg/h via INTRAVENOUS
  Administered 2019-06-22 (×2): 1.5 ug/kg/h via INTRAVENOUS
  Administered 2019-06-23: 1.2 ug/kg/h via INTRAVENOUS
  Filled 2019-06-15 (×42): qty 100

## 2019-06-15 MED ORDER — LABETALOL HCL 5 MG/ML IV SOLN
20.0000 mg | Freq: Once | INTRAVENOUS | Status: AC
  Administered 2019-06-15: 22:00:00 20 mg via INTRAVENOUS

## 2019-06-15 MED ORDER — POTASSIUM CHLORIDE 20 MEQ PO PACK
40.0000 meq | PACK | Freq: Once | ORAL | Status: AC
  Administered 2019-06-15: 16:00:00 40 meq via ORAL
  Filled 2019-06-15: qty 2

## 2019-06-15 MED ORDER — LABETALOL HCL 5 MG/ML IV SOLN
INTRAVENOUS | Status: AC
  Administered 2019-06-15: 20 mg via INTRAVENOUS
  Filled 2019-06-15: qty 4

## 2019-06-15 MED ORDER — MIDAZOLAM HCL 2 MG/2ML IJ SOLN
4.0000 mg | Freq: Once | INTRAMUSCULAR | Status: AC
  Administered 2019-06-15: 4 mg via INTRAVENOUS
  Filled 2019-06-15: qty 4

## 2019-06-15 MED ORDER — METOPROLOL TARTRATE 5 MG/5ML IV SOLN
5.0000 mg | Freq: Once | INTRAVENOUS | Status: AC
  Administered 2019-06-15: 22:00:00 5 mg via INTRAVENOUS

## 2019-06-15 MED ORDER — MAGNESIUM SULFATE 2 GM/50ML IV SOLN
2.0000 g | Freq: Once | INTRAVENOUS | Status: AC
  Administered 2019-06-15: 2 g via INTRAVENOUS
  Filled 2019-06-15: qty 50

## 2019-06-15 MED ORDER — STERILE WATER FOR INJECTION IJ SOLN
INTRAMUSCULAR | Status: AC
  Administered 2019-06-15: 10 mL
  Filled 2019-06-15: qty 10

## 2019-06-15 MED ORDER — VECURONIUM BROMIDE 10 MG IV SOLR
10.0000 mg | Freq: Once | INTRAVENOUS | Status: AC
  Administered 2019-06-15: 10 mg via INTRAVENOUS
  Filled 2019-06-15: qty 10

## 2019-06-15 MED ORDER — METOPROLOL TARTRATE 5 MG/5ML IV SOLN
INTRAVENOUS | Status: AC
  Administered 2019-06-15: 22:00:00 5 mg via INTRAVENOUS
  Filled 2019-06-15: qty 5

## 2019-06-15 NOTE — Progress Notes (Addendum)
[facility] Daily Progress Note   Subjective: 7 Days Post-Op: 1. Introduction catheter intorightlower extremity 3rd order catheter placement  2.Contrast injectionrightlower extremity for distal runoff  3. Percutaneous transluminal angioplasty and stent placementrightsuperficial femoral artery  4. Percutaneous transluminal angioplasty and stent placement right external iliac artery 5.Star close closureleftcommon femoral arteriotomy  Continues to be intubated and sedated for severe DT's and agitation.   Objective: Vitals:   06/15/19 0408 06/15/19 0500 06/15/19 0600 06/15/19 0750  BP:  (!) 89/66 93/69   Pulse:  85 80   Resp:      Temp:      TempSrc:      SpO2: 98% 98% 99% 100%  Weight:      Height:        Intake/Output Summary (Last 24 hours) at 06/15/2019 1026 Last data filed at 06/15/2019 0600 Gross per 24 hour  Intake 2888.92 ml  Output 1480 ml  Net 1408.92 ml   Physical Exam: Intubated and sedated. NAD CV: RRR Pulmonary: CTA Bilaterally Abdomen: Soft, Nontender, Nondistended Left Groin: Access site - clean, dry and intact Vascular: Right Lower Extremity: Thigh soft. Unable to access pedal pulses due to cast however toes are warm with a good capillary refill.   Laboratory: CBC    Component Value Date/Time   WBC 4.5 06/15/2019 0512   HGB 9.6 (L) 06/15/2019 0512   HCT 29.8 (L) 06/15/2019 0512   PLT 130 (L) 06/15/2019 0512   BMET    Component Value Date/Time   NA 139 06/15/2019 0512   K 3.7 06/15/2019 0512   CL 106 06/15/2019 0512   CO2 28 06/15/2019 0512   GLUCOSE 98 06/15/2019 0512   BUN 13 06/15/2019 0512   CREATININE 0.55 (L) 06/15/2019 0512   CALCIUM 7.5 (L) 06/15/2019 0512   GFRNONAA >60 06/15/2019 0512   GFRAA >60 06/15/2019 0512   Assessment/Planning: The patient is a 53 year old male who presented with right ankle fracture and right lower  extremity ischemia s/p lower extremity revascularization and open reduction internal fixation ankle fracture with severe alcohol withdrawal now in DTs requiring sedation and subsequent intubation  1) Appreciate assistance in management from ICU,psychiatryand medicine 2) Successful right lower extremity revascularization - no further recommendation from vascular at this time. Will continue to monitor 3) Will be transferring patient to hospitalist team as primary. Will continue to follow as consulting service.  Discussed with Dr. Eber Hong Serrina [place] PA-C 06/15/2019 10:26 AM

## 2019-06-15 NOTE — Progress Notes (Addendum)
PCCM ATTENDING ATTESTATION:  I have evaluated patient with , I personally  reviewed database in its entirety and discussed care plan in detail. In addition, this patient was discussed on multidisciplinary rounds.   Important exam findings: On vent weaning sedation in prep for sbt Mild bilateral +rhonchi Reg, no Murmurs NT.ND, soft +BS No edema     Major problems addressed by PCCM team: Acute alcohol and drug withdrawal syndrome Critical limb ischemia or RLE s/p vascular and orthopedic surgery.   PLAN/REC:  Continue Vent support-wean as tolerated Continue to monitor in ICU/SDU Need to address goals of care   Critical Care Time devoted to patient care services described in this note is 32 minutes.   Overall, patient is critically ill, prognosis is guarded.  Patient with Multiorgan failure and at high risk for cardiac arrest and death.      Ottie Glazier, M.D.  Pulmonary & [facility]        Name: Thomas [name] MRN: [id number] DOB: [birth year]     CONSULTATION DATE: 06/15/2019  CHIEF COMPLAINT:  Respiratory failure  SYNOPSIS:  Thomas [name] is a 53 yo male with an extensive psychiatric history and polysubstance abuse presented to the ED following a fall at home with right ankle fracture and right lower extremity ischemia and inability to locate pulse with doppler. After admission to the ICU, pt began exhibiting sx of drug/alchol withdrawal syndrome requiring multiple sedatives and leading to respiratory failure requiring intubation 9/27. CXR suspicious for right lower lobe pneumonia.  SIGNIFICANT EVENTS: 9/24- Presented to ED with displaced right ankle fracture and limb ischemia 9/24- Vascular surgery to revascularize right lower extremity 9/25- Open reduction internal fixation of right ankle fracture medially and laterally 9/26- patient on precedex and multiple PRN IV meds still tachycardic with episodes of aggitation and  combative/aggresive behavior. Concern for loss of airway protection 9/27- patient attempting to physically attack staff and loudly verbally abusive, he is in high doses of IV sedation. After additional PRN IV medications patient temporarilly sedated with episodic apnea. Pt sedated and intubated. 9/28 failed SAT due to severe agitation and delirium 9/29PICC line placed 9/30 failed SAT again for severe agitation and delirium 9/30 CXR suspicious for right lower lobe pneumonia, began treatment with Unasyn 10/2 Pt failed spontaneous breathing trial with sedation   HISTORY OF PRESENT ILLNESS:   Pt currently on vent support and sedated. Failed SBT today because he became apneic without vent support. May try SAT to reassess agitation.  PAST MEDICAL HISTORY :   has a past medical history of Alcohol abuse, Anemia, Bipolar 1 disorder ([place]), Cervical spine fracture ([place]), Chronic, continuous use of opioids, History of seizure, HTN (hypertension), Insomnia, MDD (major depressive disorder), recurrent, severe, with psychosis ([place]), and Pancreatitis.  has a past surgical history that includes Lower Extremity Angiography (Right, 06/07/2019) and ORIF ankle fracture (Right, 06/08/2019). Prior to Admission medications   Medication Sig Start Date End Date Taking? Authorizing Provider  albuterol (VENTOLIN HFA) 108 (90 Base) MCG/ACT inhaler Inhale 2 puffs into the lungs every 6 (six) hours as needed for wheezing. 02/24/19  Yes [provider]  doxepin (SINEQUAN) 100 MG capsule Take 200 mg by mouth at bedtime. 04/26/19  Yes [provider]  pregabalin (LYRICA) 200 MG capsule Take 200 mg by mouth 3 (three) times daily. 01/25/19  Yes [provider]  SUBOXONE 8-2 MG FILM Place 1 Film under the tongue 3 (three) times daily. 05/22/19  Yes [provider]   Allergies  Allergen Reactions  . Haldol [Haloperidol Lactate]    REVIEW OF SYSTEMS:   Unable to obtain due to critical illness   VITAL SIGNS: Temp:  [97.8 F (36.6 C)-98.6 F (37 C)] 98.6 F (37 C) (10/02 0800) Pulse Rate:  [79-105] 80 (10/02 1100) BP: (84-130)/(62-87) 105/75 (10/02 1100) SpO2:  [96 %-100 %] 97 % (10/02 1125) FiO2 (%):  [28 %] 28 % (10/02 1125)   I/O last 3 completed shifts: In: 3106.5 [I.V.:2461.5; IV Piggyback:645] Out: 2805 [Urine:2805] Total I/O In: 95 [I.V.:95] Out: -    SpO2: 97 % O2 Flow Rate (L/min): 6 L/min FiO2 (%): 28 %  Vent Mode: PRVC FiO2 (%):  [28 %] 28 % Set Rate:  [16 bmp] 16 bmp Vt Set:  [500 mL] 500 mL PEEP:  [5 [id number] cmH20] 5 cmH20 Pressure Support:  [5 cmH20] 5 cmH20 Plateau Pressure:  [18 cmH20] 18 cmH20   Physical Examination:  GENERAL:critically ill appearing, intubated and sedated HEAD: Normocephalic, atraumatic.  EYES: Pupils equal, round, reactive to light.  No scleral icterus.  MOUTH: Moist mucosal membrane. NECK: Supple. No JVD.  PULMONARY: lungs clear to auscultation CARDIOVASCULAR: S1 and S2. Regular rate and rhythm. No murmurs, rubs, or gallops.  GASTROINTESTINAL: Soft, nontender, -distended. No masses. Positive bowel sounds. No hepatosplenomegaly.  MUSCULOSKELETAL: No swelling, clubbing, or edema.  NEUROLOGIC: obtunded, RASS -4 SKIN:intact,warm,dry  I personally reviewed lab work that was obtained in last 24 hrs. CXR Independently reviewed  MEDICATIONS: I have reviewed all medications and confirmed regimen as documented   CULTURE RESULTS   Recent Results (from the past 240 hour(s))  SARS Coronavirus 2 [facility] order, Performed in [facility] hospital lab) Nasopharyngeal Nasopharyngeal Swab     Status: None   Collection Time: 06/07/19  6:34 PM   Specimen: Nasopharyngeal Swab  Result Value Ref Range Status   SARS Coronavirus 2 NEGATIVE NEGATIVE Final    Comment: (NOTE) If result is NEGATIVE SARS-CoV-2 target nucleic acids are NOT DETECTED. The SARS-CoV-2 RNA is generally detectable in upper and lower  respiratory specimens  during the acute phase of infection. The lowest  concentration of SARS-CoV-2 viral copies this assay can detect is 250  copies / mL. A negative result does not preclude SARS-CoV-2 infection  and should not be used as the sole basis for treatment or other  patient management decisions.  A negative result may occur with  improper specimen collection / handling, submission of specimen other  than nasopharyngeal swab, presence of viral mutation(s) within the  areas targeted by this assay, and inadequate number of viral copies  (<250 copies / mL). A negative result must be combined with clinical  observations, patient history, and epidemiological information. If result is POSITIVE SARS-CoV-2 target nucleic acids are DETECTED. The SARS-CoV-2 RNA is generally detectable in upper and lower  respiratory specimens dur ing the acute phase of infection.  Positive  results are indicative of active infection with SARS-CoV-2.  Clinical  correlation with patient history and other diagnostic information is  necessary to determine patient infection status.  Positive results do  not rule out bacterial infection or co-infection with other viruses. If result is PRESUMPTIVE POSTIVE SARS-CoV-2 nucleic acids MAY BE PRESENT.   A presumptive positive result was obtained on the submitted specimen  and confirmed on repeat testing.  While 2019 novel coronavirus  (SARS-CoV-2) nucleic acids may be present in the submitted sample  additional confirmatory testing may be necessary for epidemiological  and /  or clinical management purposes  to differentiate between  SARS-CoV-2 and other Sarbecovirus currently known to infect humans.  If clinically indicated additional testing with an alternate test  methodology [phone]) is advised. The SARS-CoV-2 RNA is generally  detectable in upper and lower respiratory sp ecimens during the acute  phase of infection. The expected result is Negative. Fact Sheet for Patients:   StrictlyIdeas.no Fact Sheet for Healthcare Providers: BankingDealers.co.za This test is not yet approved or cleared by the [place] FDA and has been authorized for detection and/or diagnosis of SARS-CoV-2 by FDA under an Emergency Use Authorization (EUA).  This EUA will remain in effect (meaning this test can be used) for the duration of the COVID-19 declaration under Section 564(b)(1) of the Act, 21 U.S.C. section 360bbb-3(b)(1), unless the authorization is terminated or revoked sooner. Performed at [facility], [address] [place], [place] [address]   MRSA PCR Screening     Status: None   Collection Time: 06/08/19  5:53 AM   Specimen: Nasal Mucosa; Nasopharyngeal  Result Value Ref Range Status   MRSA by PCR NEGATIVE NEGATIVE Final    Comment:        The GeneXpert MRSA Assay (FDA approved for NASAL specimens only), is one component of a comprehensive MRSA colonization surveillance program. It is not intended to diagnose MRSA infection nor to guide or monitor treatment for MRSA infections. Performed at [facility], [address] [place], [place] [address]   Culture, respiratory     Status: None (Preliminary result)   Collection Time: 06/13/19  3:28 PM   Specimen: Tracheal Aspirate  Result Value Ref Range Status   Specimen Description   Final    TRACHEAL ASPIRATE Performed at [facility], [address], [place], [place] [address]    Special Requests   Final    NONE Performed at [facility], [address] [place], [place] [address]    Gram Stain   Final    ABUNDANT WBC PRESENT, PREDOMINANTLY PMN ABUNDANT GRAM POSITIVE COCCI RARE GRAM NEGATIVE RODS    Culture   Final    TOO YOUNG TO READ Performed at [facility], [place] [address] [place], [place] [address]    Report Status PENDING  Incomplete          IMAGING    Dg Chest Port 1 View  Result Date:  06/15/2019 CLINICAL DATA:  Acute respiratory failure and hypoxia. EXAM: PORTABLE CHEST 1 VIEW COMPARISON:  Chest radiograph dated 06/13/2019. FINDINGS: An endotracheal tube terminates in the midthoracic trachea. An enteric tube enters the stomach and terminates below the field of view. A right upper extremity central venous catheter tip overlies the superior cavoatrial junction. Cervical spinal fusion hardware is seen. Bilateral interstitial and airspace opacities are not significantly changed since prior exam and are most significant in the right lower lung. There is no pleural effusion or pneumothorax. The cardiomediastinal silhouette is unchanged. IMPRESSION: Unchanged bilateral interstitial and airspace opacities which are most significant in the right lower lung. Electronically Signed   By: Zerita Boers M.D.   On: 06/15/2019 08:39        Indwelling Urinary Catheter continued, requirement due to   Reason to continue Indwelling Urinary Catheter strict Intake/Output monitoring for hemodynamic instability   Central Line/ continued, requirement due to  Reason to continue Sentinel of central venous pressure or other hemodynamic parameters and poor IV access   Ventilator continued, requirement due to severe respiratory  failure   Ventilator Sedation RASS 0 to -2      ASSESSMENT AND PLAN SYNOPSIS 53 yo male with significant psychiatric history presents to ICU in acute respiratory failure following agitation and aggression towards staff requiring sedation. Respiratory failure likely also from right lower lobe aspiration pneumonia. Originally, pt here because of right ankle displaced fracture and limb ischemia requiring orthopedic and vascular surgery.  Severe ACUTE Hypoxic Respiratory Failure CXR suggestive of RLL aspiration pneumonia vs pulmonary edema, will attempt to diurese today -continue Full MV support -continue Bronchodilator Therapy -Wean Fio2 and PEEP as tolerated  -will perform SAT/SBT when respiratory parameters are met  Right lower lobe aspiration pneumonia -continue IV abx as prescibed -follow up cultures  ETOH abuse with Severe Delirium Tremens -patient remains very agitated- will reassess with SAT today -needs ICU monitoring -CIWA protocol  NEUROLOGY - intubated and sedated - minimal sedation to achieve a RASS goal: -1 - Wake up assessment pending Follow psych recommendations  CARDIAC ICU monitoring - Monitor QT prolongation  GI GI PROPHYLAXIS as indicated  NUTRITIONAL STATUS DIET-->TF's as tolerated Constipation protocol as indicated  ENDO - will use ICU hypoglycemic\Hyperglycemia protocol if needed  ELECTROLYTES -follow labs as needed -replace as needed -pharmacy consultation and following  DVT/GI PRX ordered TRANSFUSIONS AS NEEDED MONITOR FSBS ASSESS the need for LABS    Overall, patient is critically ill, prognosis is guarded.  Patient with Multiorgan failure and at high risk for cardiac arrest and death.    Nira Conn, PA-S   Ottie Glazier, M.D.  Pulmonary & [facility] [place]    Critical care provider statement:    Critical care time (minutes):  32   Critical care time was exclusive of:  Separately billable procedures and  treating other patients   Critical care was necessary to treat or prevent imminent or  life-threatening deterioration of the following conditions:  acute alcohol and drug withdrawal syndrome, bipolar disorder, critical limb ischemia or RLE, multple comorbid conditions.     Critical care was time spent personally by me on the following  activities:  Development of treatment plan with patient or surrogate,  discussions with consultants, evaluation of patient's response to  treatment, examination of patient, obtaining history from patient or  surrogate, ordering and performing treatments and interventions, ordering  and review of laboratory  studies and re-evaluation of patient's condition   I assumed direction of critical care for this patient from another  provider in my specialty: no

## 2019-06-15 NOTE — Consult Note (Signed)
PHARMACY CONSULT NOTE - FOLLOW UP  Pharmacy Consult for Electrolyte Monitoring and Replacement   53 y/o M admitted with right lower extremity ischemia and right ankle fracture now s/p peripheral revascularization and ORIF. Patient has a history significant for schizophrenia and substance abuse. Post-operatively he became extremely agitated likely secondary to drug/alcohol withdrawal requiring high doses of sedatives. He is currently intubated and sedated in the ICU.   Recent Labs: Potassium (mmol/L)  Date Value  06/15/2019 3.7   Magnesium (mg/dL)  Date Value  06/15/2019 1.7   Calcium (mg/dL)  Date Value  06/15/2019 7.5 (L)   Albumin (g/dL)  Date Value  06/11/2019 3.3 (L)   Phosphorus (mg/dL)  Date Value  06/15/2019 3.2   Sodium (mmol/L)  Date Value  06/15/2019 139    Electrolytes: Patient has a history of alcohol abuse. Baseline nutritional status un-clear. On tube feeds.Goal of therapy to replete electrolytes to normal levels. Patient with good UOP. Multiple bowel movements in last 12h. Sodium trending up. Phosphorous on lower end of normal today.    -Potassium chloride packet 40 mEq x1   -2 g IV magnesium x1   -Continue free water flushes 100 mL q4h   -BMP, Mg, Phos tomorrow AM  Constipation: Patient received bisacodyl suppository x1 9/29. He has had multiple stool outputs documented in chart since then. Constipating medications include hydromorphone and doxepin. Per discussion on rounds, plan to taper doxepin dose by 25% every 5 days. Rectal tube was placed on 09/29.     -Continue bisacodyl suppository PRN  Glucose: No baseline A1c in chart and no history of diabetes recorded. Currently on tube feeds. Received one dose of methylprednisolone 125 mg on 9/27. Medications that could contribute to development of new-onset diabetes include quetiapine. Glucoses have been normal with a few mild hyperglycemic readings which may be associated with steroids.     -Continue to monitor  for hyper/hypoglycemia   [place] Resident 06/15/2019 12:25 PM

## 2019-06-15 NOTE — Consult Note (Signed)
Pharmacy Antibiotic Note  Thomas [name] is a 53 y.o. male admitted on 06/07/2019 with pneumonia. Patient admitted with limb ischemia and right ankle fracture now s/p peripheral revascularization and ORIF. Post-operatively patient became extremely agitated and aggressive/combative towards staff thought to be secondary to drug/alcohol withdrawal. He was subsequently intubated requiring high doses of sedation to maintain RASS goals. 9/30 CXR significant for atelectasis versus infiltrate at right base. No leukocytosis or fevers. Pharmacy consulted for Unasyn dosing.   Patient has completed 2 days of antibiotic therapy. Afebrile last 24h. Patient was leukopenic yesterday now at lower end of normal range today. No left shift on differential.   Plan: Continue Unasyn 3 g q6h   Continue to monitor clinical status and for s/sx of resolution of infection.   Height: 6' 0.01" (182.9 cm) Weight: 168 lb 14 oz (76.6 kg) IBW/kg (Calculated) : 77.62  Temp (24hrs), Avg:98.2 F (36.8 C), Min:97.8 F (36.6 C), Max:98.6 F (37 C)  Recent Labs  Lab 06/11/19 0443 06/11/19 1022 06/11/19 1222 06/12/19 0523 06/13/19 0426 06/14/19 0507 06/15/19 0512  WBC 5.1  --   --  7.0 5.5 2.5* 4.5  CREATININE 0.70  --   --  0.71 0.51* 0.46* 0.55*  LATICACIDVEN  --  0.9 1.1  --   --   --   --     Estimated Creatinine Clearance: 115.7 mL/min (A) (by C-G formula based on SCr of 0.55 mg/dL (L)).    Allergies  Allergen Reactions  . Haldol [Haloperidol Lactate]     Antimicrobials this admission: Cefazolin 9/25 >> 9/26 Clindamycin 9/25 >> 9/26 Unasyn 9/30 >>  Dose adjustments this admission: N/A  Microbiology results: 9/30 Sputum: Gram stain: abundant WBC present, abundant gram positive cocci, rare gram negative rods 9/25 MRSA PCR: negative  Thank you for allowing pharmacy to be a part of this patient's care.  Benita Gutter 06/15/2019 12:27 PM

## 2019-06-15 NOTE — Consult Note (Signed)
  Patient remains intubated in the ICU.  Per nursing they have been coming down on the sedation in hopes to extubate patient soon.  Psychiatry will continue to follow

## 2019-06-15 NOTE — Progress Notes (Signed)
[facility] [place] [facility]   PATIENT NAME: Thomas [name]    MR#:  [id number]  DATE OF BIRTH:  12-03-1965  SUBJECTIVE:   Intubated remains on the ventilator--trying slowly to wean sedation IV Dilaudid, versed and Precedex gtt REVIEW OF SYSTEMS:   Review of Systems  Unable to perform ROS: Intubated   DRUG ALLERGIES:   Allergies  Allergen Reactions  . Haldol [Haloperidol Lactate]     VITALS:  Blood pressure 98/71, pulse 86, temperature 98.9 F (37.2 C), temperature source Oral, resp. rate 19, height 6' 0.01" (1.829 m), weight 76.6 kg, SpO2 98 %.  PHYSICAL EXAMINATION:   Physical Exam  GENERAL:  53 y.o.-year-old patient lying in the bed with no acute distress Critically ill, Intubated EYES: Pupils equal, round, reactive to light and accommodation. No scleral icteru HEENT: Head atraumatic, normocephalic. Oropharynx and nasopharynx clear.  NECK:  Supple, no jugular venous distention. No thyroid enlargement, no tenderness.  LUNGS: Normal breath sounds bilaterally, no wheezing, rales, rhonchi. No use of accessory muscles of respiration.  CARDIOVASCULAR: S1, S2 normal. No murmurs, rubs, or gallops.  ABDOMEN: Soft, nontender, nondistended. Bowel sounds present. No organomegaly or mass.  EXTREMITIES: No cyanosis, clubbing or edema b/l.    NEUROLOGIC: intubated  PSYCHIATRIC: sedated and intubated SKIN: No obvious rash, lesion, or ulcer.   LABORATORY PANEL:  CBC Recent Labs  Lab 06/15/19 0512  WBC 4.5  HGB 9.6*  HCT 29.8*  PLT 130*    Chemistries  Recent Labs  Lab 06/11/19 0443  06/15/19 0512  NA 139   < > 139  K 4.4   < > 3.7  CL 109   < > 106  CO2 22   < > 28  GLUCOSE 124*   < > 98  BUN 17   < > 13  CREATININE 0.70   < > 0.55*  CALCIUM 8.3*   < > 7.5*  MG 1.7   < > 1.7  AST 19  --   --   ALT 14  --   --   ALKPHOS 72  --   --   BILITOT 0.9  --   --    < > = values in this interval not displayed.   Cardiac Enzymes No  results for input(s): TROPONINI in the last 168 hours. RADIOLOGY:  Dg Chest Port 1 View  Result Date: 06/15/2019 CLINICAL DATA:  Acute respiratory failure and hypoxia. EXAM: PORTABLE CHEST 1 VIEW COMPARISON:  Chest radiograph dated 06/13/2019. FINDINGS: An endotracheal tube terminates in the midthoracic trachea. An enteric tube enters the stomach and terminates below the field of view. A right upper extremity central venous catheter tip overlies the superior cavoatrial junction. Cervical spinal fusion hardware is seen. Bilateral interstitial and airspace opacities are not significantly changed since prior exam and are most significant in the right lower lung. There is no pleural effusion or pneumothorax. The cardiomediastinal silhouette is unchanged. IMPRESSION: Unchanged bilateral interstitial and airspace opacities which are most significant in the right lower lung. Electronically Signed   By: Romona Curls M.D.   On: 06/15/2019 08:39   ASSESSMENT AND PLAN:   Thomas [name]  is a 53 y.o. male with a known history of polysubstance abuse including alcohol, schizoaffective bipolar type, seizures, hypertension and insomnia who initially presented on 06/07/2019 following an episode of a fall.  Was evaluated in the emergency room and found to have right ankle fracture as well as right foot ischemia.Went into alcohol withdrawal  and currently sedated on the vent  1. Acute hypoxic respiratory failure with acute encephalopathy with agitation  -Patient went into alcohol withdrawal requiring several sedatives and subsequently intubated for hypoxic respiratory failure -Remains sedated on the vent. -Being managed by ICU team--slow wean has been started  2.  Right lower extremity ischemia -status post revascularization with angioplasty and stent placement by vascular surgery in right superficial femoral artery, right external iliac artery. -Continue aspirin  3. Right ankle fracture. Status post fall Patient  status post open reduction with internal fixation of right ankle by orthopedic physician.  Day 7 postop Being managed by orthopedic surgery.  4.  History of schizoaffective bipolar type -Currently sedated on the vent. -Being followed by psychiatry service and managing psych meds.  5.  Alcohol abuse now with delirium tremens [Patient requiring sedation on the vent. [Continue thiamine, multivitamin and folic acid.  6. DVT prophylaxis; Lovenox  CODE STATUS: *[name]*    TOTAL TIME TAKING CARE OF THIS PATIENT: *30* minutes.  >50% time spent on counselling and coordination of care  POSSIBLE D/C IN *few* DAYS, DEPENDING ON CLINICAL CONDITION.  Note: This dictation was prepared with Dragon dictation along with smaller phrase technology. Any transcriptional errors that result from this process are unintentional.  Fritzi Mandes M.D on 06/15/2019 at 2:45 PM  Between 7am to 6pm - Pager - [phone]  After 6pm go to [email or web address] - password [email or web address]  [facility]  Office  [phone]  CC: Primary care physician; Patient, No Pcp PerPatient ID: Thomas [name], male   DOB: 08-23-1966, 53 y.o.   MRN: [id number]

## 2019-06-16 LAB — BASIC METABOLIC PANEL
Anion gap: 5 (ref 5–15)
BUN: 13 mg/dL (ref 6–20)
CO2: 27 mmol/L (ref 22–32)
Calcium: 8 mg/dL — ABNORMAL LOW (ref 8.9–10.3)
Chloride: 106 mmol/L (ref 98–111)
Creatinine, Ser: 0.64 mg/dL (ref 0.61–1.24)
GFR calc Af Amer: >60 mL/min (ref >60)
GFR calc non Af Amer: >60 mL/min (ref >60)
Glucose, Bld: 111 mg/dL — ABNORMAL HIGH (ref 70–99)
Potassium: 4.7 mmol/L (ref 3.5–5.1)
Sodium: 138 mmol/L (ref 135–145)

## 2019-06-16 LAB — CBC WITH DIFFERENTIAL/PLATELET
Abs Immature Granulocytes: 0.05 10*3/uL (ref 0.00–0.07)
Basophils Absolute: 0 10*3/uL (ref 0.0–0.1)
Basophils Relative: 1 %
Eosinophils Absolute: 0.2 10*3/uL (ref 0.0–0.5)
Eosinophils Relative: 4 %
HCT: 32.4 % — ABNORMAL LOW (ref 39.0–52.0)
Hemoglobin: 10.6 g/dL — ABNORMAL LOW (ref 13.0–17.0)
Immature Granulocytes: 1 %
Lymphocytes Relative: 24 %
Lymphs Abs: 1.3 10*3/uL (ref 0.7–4.0)
MCH: 33.3 pg (ref 26.0–34.0)
MCHC: 32.7 g/dL (ref 30.0–36.0)
MCV: 101.9 fL — ABNORMAL HIGH (ref 80.0–100.0)
Monocytes Absolute: 0.6 10*3/uL (ref 0.1–1.0)
Monocytes Relative: 11 %
Neutro Abs: 3.3 10*3/uL (ref 1.7–7.7)
Neutrophils Relative %: 59 %
Platelets: 182 10*3/uL (ref 150–400)
RBC: 3.18 MIL/uL — ABNORMAL LOW (ref 4.22–5.81)
RDW: 13.3 % (ref 11.5–15.5)
WBC: 5.5 10*3/uL (ref 4.0–10.5)
nRBC: 0 % (ref 0.0–0.2)

## 2019-06-16 LAB — OSMOLALITY: Osmolality: 289 mOsm/kg (ref 275–295)

## 2019-06-16 LAB — SODIUM, URINE, RANDOM: Sodium, Ur: 93 mmol/L

## 2019-06-16 LAB — OSMOLALITY, URINE: Osmolality, Ur: 249 mOsm/kg — ABNORMAL LOW (ref 300–900)

## 2019-06-16 LAB — CORTISOL: Cortisol, Plasma: 6.1 ug/dL

## 2019-06-16 LAB — MAGNESIUM: Magnesium: 2.1 mg/dL (ref 1.7–2.4)

## 2019-06-16 LAB — PHOSPHORUS: Phosphorus: 4.6 mg/dL (ref 2.5–4.6)

## 2019-06-16 MED ORDER — LEVOFLOXACIN IN D5W 750 MG/150ML IV SOLN
750.0000 mg | INTRAVENOUS | Status: AC
  Administered 2019-06-16: 750 mg via INTRAVENOUS
  Filled 2019-06-16: qty 150

## 2019-06-16 MED ORDER — DIPHENHYDRAMINE HCL 50 MG/ML IJ SOLN
25.0000 mg | Freq: Four times a day (QID) | INTRAMUSCULAR | Status: DC | PRN
  Administered 2019-06-16 – 2019-06-17 (×3): 25 mg via INTRAVENOUS
  Filled 2019-06-16 (×2): qty 1

## 2019-06-16 MED ORDER — METHYLPREDNISOLONE SODIUM SUCC 125 MG IJ SOLR
60.0000 mg | Freq: Four times a day (QID) | INTRAMUSCULAR | Status: AC
  Administered 2019-06-16 – 2019-06-17 (×4): 60 mg via INTRAVENOUS
  Filled 2019-06-16 (×3): qty 2

## 2019-06-16 NOTE — Progress Notes (Signed)
Patient continues to be intubated with Dr Lanney Gins desiring to extubate him.  Fear is he will need to be intubated again related to his agitation during withdrawal.  He was at [facility] recently for 2 months due to severe alcohol withdrawal symptoms.  Fine line between controlling his agitation with Ativan and other methods considering his medical compromised state.    [name], PMHNP

## 2019-06-16 NOTE — Progress Notes (Signed)
Busy day until 1600. Girlfriend in and helped calm patient but not extubated today so re-sedated heavily at 1600. Turned every 2 hours and placed legs back in bed every 30 minutes. Never stayed turned for more than 15 minutes. Urine output still great. Rectal foley only put out 200 mls.

## 2019-06-16 NOTE — Progress Notes (Signed)
MRN: [id number] DOB: December 11, 1965     CONSULTATION DATE: 06/15/2019  CHIEF COMPLAINT:  Respiratory failure  SYNOPSIS:  Thomas [name] is a 53 yo male with an extensive psychiatric history and polysubstance abuse presented to the ED following a fall at home with right ankle fracture and right lower extremity ischemia and inability to locate pulse with doppler. After admission to the ICU, pt began exhibiting sx of drug/alchol withdrawal syndrome requiring multiple sedatives and leading to respiratory failure requiring intubation 9/27. CXR suspicious for right lower lobe pneumonia.  SIGNIFICANT EVENTS: 9/24- Presented to ED with displaced right ankle fracture and limb ischemia 9/24- Vascular surgery to revascularize right lower extremity 9/25- Open reduction internal fixation of right ankle fracture medially and laterally 9/26- patient on precedex and multiple PRN IV meds still tachycardic with episodes of aggitation and combative/aggresive behavior. Concern for loss of airway protection 9/27- patient attempting to physically attack staff and loudly verbally abusive, he is in high doses of IV sedation. After additional PRN IV medications patient temporarilly sedated with episodic apnea. Pt sedated and intubated. 9/28 failed SAT due to severe agitation and delirium 9/29PICC line placed 9/30 failed SAT again for severe agitation and delirium 9/30 CXR suspicious for right lower lobe pneumonia, began treatment with Unasyn 10/2 Pt failed spontaneous breathing trial with sedation 10/3 -discussed case today with mother Thomas [name] and common-law wife [name], they report patient had seizures few days prior to this admissions due to alcohol withdrawal, they also report previous admission to [facility] for over 2 months due to refractory seizures with severe delirium tremens resulting in fall and cervical neck fracture status post neurosurgical intervention.  Am very concerned that if patient becomes combative  again he might refracture his right lower extremity since he has attempted to kick staff with it, additionally he requires multiple staff members to be present for redirection and protect him from self injury.  I discussed this with charge nurse and we will likely need to wait till Monday to have police deputy present at all times post extubation to control violent behavior.  I have discussed this with psychiatry nurse practitioner [name] and she will have discourse with psychiatrist regarding additional options.   HISTORY OF PRESENT ILLNESS:   Pt currently on vent support and sedated. Failed SBT today because he became apneic without vent support. May try SAT to reassess agitation.  PAST MEDICAL HISTORY :   has a past medical history of Alcohol abuse, Anemia, Bipolar 1 disorder ([place]), Cervical spine fracture ([place]), Chronic, continuous use of opioids, History of seizure, HTN (hypertension), Insomnia, MDD (major depressive disorder), recurrent, severe, with psychosis ([place]), and Pancreatitis.  has a past surgical history that includes Lower Extremity Angiography (Right, 06/07/2019) and ORIF ankle fracture (Right, 06/08/2019). Prior to Admission medications   Medication Sig Start Date End Date Taking? Authorizing Provider  albuterol (VENTOLIN HFA) 108 (90 Base) MCG/ACT inhaler Inhale 2 puffs into the lungs every 6 (six) hours as needed for wheezing. 02/24/19  Yes [provider]  doxepin (SINEQUAN) 100 MG capsule Take 200 mg by mouth at bedtime. 04/26/19  Yes [provider]  pregabalin (LYRICA) 200 MG capsule Take 200 mg by mouth 3 (three) times daily. 01/25/19  Yes [provider]  SUBOXONE 8-2 MG FILM Place 1 Film under the tongue 3 (three) times daily. 05/22/19  Yes [provider]   Allergies  Allergen Reactions  . Haldol [Haloperidol Lactate]    REVIEW OF SYSTEMS:   Unable to obtain  due to critical illness  VITAL SIGNS: Temp:  [98 F (36.7 C)-99 F (37.2  C)] 98.7 F (37.1 C) (10/03 0456) Pulse Rate:  [65-107] 102 (10/03 0600) BP: (72-176)/(54-120) 108/78 (10/03 0600) SpO2:  [96 %-100 %] 97 % (10/03 0600) FiO2 (%):  [28 %] 28 % (10/03 0845)   I/O last 3 completed shifts: In: 1734.6 [I.V.:1134.1; IV Piggyback:600.5] Out: 7253 [Urine:3075; Stool:300] No intake/output data recorded.   SpO2: 97 % O2 Flow Rate (L/min): 6 L/min FiO2 (%): 28 %  Vent Mode: PRVC FiO2 (%):  [28 %] 28 % Set Rate:  [16 bmp] 16 bmp Vt Set:  [500 mL] 500 mL PEEP:  [5 cmH20] 5 cmH20 Plateau Pressure:  [11 cmH20-17 cmH20] 12 cmH20   Physical Examination:  GENERAL:critically ill appearing, intubated and sedated HEAD: Normocephalic, atraumatic.  EYES: Pupils equal, round, reactive to light.  No scleral icterus.  MOUTH: Moist mucosal membrane. NECK: Supple. No JVD.  PULMONARY: lungs clear to auscultation CARDIOVASCULAR: S1 and S2. Regular rate and rhythm. No murmurs, rubs, or gallops.  GASTROINTESTINAL: Soft, nontender, -distended. No masses. Positive bowel sounds. No hepatosplenomegaly.  MUSCULOSKELETAL: No swelling, clubbing, or edema.  NEUROLOGIC: obtunded, RASS -4 SKIN:intact,warm,dry  I personally reviewed lab work that was obtained in last 24 hrs. CXR Independently reviewed  MEDICATIONS: I have reviewed all medications and confirmed regimen as documented   CULTURE RESULTS   Recent Results (from the past 240 hour(s))  SARS Coronavirus 2 [facility] order, Performed in [facility] hospital lab) Nasopharyngeal Nasopharyngeal Swab     Status: None   Collection Time: 06/07/19  6:34 PM   Specimen: Nasopharyngeal Swab  Result Value Ref Range Status   SARS Coronavirus 2 NEGATIVE NEGATIVE Final    Comment: (NOTE) If result is NEGATIVE SARS-CoV-2 target nucleic acids are NOT DETECTED. The SARS-CoV-2 RNA is generally detectable in upper and lower  respiratory specimens during the acute phase of infection. The lowest  concentration of SARS-CoV-2 viral  copies this assay can detect is 250  copies / mL. A negative result does not preclude SARS-CoV-2 infection  and should not be used as the sole basis for treatment or other  patient management decisions.  A negative result may occur with  improper specimen collection / handling, submission of specimen other  than nasopharyngeal swab, presence of viral mutation(s) within the  areas targeted by this assay, and inadequate number of viral copies  (<250 copies / mL). A negative result must be combined with clinical  observations, patient history, and epidemiological information. If result is POSITIVE SARS-CoV-2 target nucleic acids are DETECTED. The SARS-CoV-2 RNA is generally detectable in upper and lower  respiratory specimens dur ing the acute phase of infection.  Positive  results are indicative of active infection with SARS-CoV-2.  Clinical  correlation with patient history and other diagnostic information is  necessary to determine patient infection status.  Positive results do  not rule out bacterial infection or co-infection with other viruses. If result is PRESUMPTIVE POSTIVE SARS-CoV-2 nucleic acids MAY BE PRESENT.   A presumptive positive result was obtained on the submitted specimen  and confirmed on repeat testing.  While 2019 novel coronavirus  (SARS-CoV-2) nucleic acids may be present in the submitted sample  additional confirmatory testing may be necessary for epidemiological  and / or clinical management purposes  to differentiate between  SARS-CoV-2 and other Sarbecovirus currently known to infect humans.  If clinically indicated additional testing with an alternate test  methodology [phone]) is advised. The  SARS-CoV-2 RNA is generally  detectable in upper and lower respiratory sp ecimens during the acute  phase of infection. The expected result is Negative. Fact Sheet for Patients:  StrictlyIdeas.no Fact Sheet for Healthcare Providers:  BankingDealers.co.za This test is not yet approved or cleared by the [place] FDA and has been authorized for detection and/or diagnosis of SARS-CoV-2 by FDA under an Emergency Use Authorization (EUA).  This EUA will remain in effect (meaning this test can be used) for the duration of the COVID-19 declaration under Section 564(b)(1) of the Act, 21 U.S.C. section 360bbb-3(b)(1), unless the authorization is terminated or revoked sooner. Performed at [facility], [address] [place], [place] [address]   MRSA PCR Screening     Status: None   Collection Time: 06/08/19  5:53 AM   Specimen: Nasal Mucosa; Nasopharyngeal  Result Value Ref Range Status   MRSA by PCR NEGATIVE NEGATIVE Final    Comment:        The GeneXpert MRSA Assay (FDA approved for NASAL specimens only), is one component of a comprehensive MRSA colonization surveillance program. It is not intended to diagnose MRSA infection nor to guide or monitor treatment for MRSA infections. Performed at [facility], [address] [place], [place] [address]   Culture, respiratory     Status: None (Preliminary result)   Collection Time: 06/13/19  3:28 PM   Specimen: Tracheal Aspirate  Result Value Ref Range Status   Specimen Description   Final    TRACHEAL ASPIRATE Performed at [facility], [address] [place], [place] [address]    Special Requests   Final    NONE Performed at [facility], [address] [place], [place] [address]    Gram Stain   Final    ABUNDANT WBC PRESENT, PREDOMINANTLY PMN ABUNDANT GRAM POSITIVE COCCI RARE GRAM NEGATIVE RODS    Culture   Final    CULTURE REINCUBATED FOR BETTER GROWTH Performed at [facility], [place] [address] [place], [place] [address]    Report Status PENDING  Incomplete          IMAGING    No results found.      Indwelling Urinary Catheter continued, requirement due to   Reason to  continue Indwelling Urinary Catheter strict Intake/Output monitoring for hemodynamic instability   Central Line/ continued, requirement due to  Reason to continue [place] of central venous pressure or other hemodynamic parameters and poor IV access   Ventilator continued, requirement due to severe respiratory failure   Ventilator Sedation RASS 0 to -2      ASSESSMENT AND PLAN SYNOPSIS 53 yo male with significant psychiatric history presents to ICU in acute respiratory failure following agitation and aggression towards staff requiring sedation. Respiratory failure likely also from right lower lobe aspiration pneumonia. Originally, pt here because of right ankle displaced fracture and limb ischemia requiring orthopedic and vascular surgery.  Severe ACUTE Hypoxic Respiratory Failure CXR suggestive of RLL aspiration pneumonia vs pulmonary edema, will attempt to diurese today -continue Full MV support -continue Bronchodilator Therapy -Wean Fio2 and PEEP as tolerated -will perform SAT/SBT when respiratory parameters are met  Right lower lobe aspiration pneumonia -continue IV abx as prescibed -follow up cultures  ETOH abuse with Severe Delirium Tremens -patient remains very agitated- will reassess with SAT today -needs ICU monitoring -CIWA protocol  NEUROLOGY - intubated and sedated - minimal sedation to achieve a RASS goal: -1 - Wake up assessment pending Follow  psych recommendations  CARDIAC ICU monitoring - Monitor QT prolongation  GI GI PROPHYLAXIS as indicated  NUTRITIONAL STATUS DIET-->TF's as tolerated Constipation protocol as indicated  ENDO - will use ICU hypoglycemic\Hyperglycemia protocol if needed  ELECTROLYTES -follow labs as needed -replace as needed -pharmacy consultation and following  DVT/GI PRX ordered TRANSFUSIONS AS NEEDED MONITOR FSBS ASSESS the need for LABS     Ottie Glazier, M.D.  Pulmonary & [place]    Critical care provider statement:    Critical care time (minutes):  32   Critical care time was exclusive of:  Separately billable procedures and  treating other patients   Critical care was necessary to treat or prevent imminent or  life-threatening deterioration of the following conditions:  acute alcohol and drug withdrawal syndrome, bipolar disorder, critical limb ischemia or RLE, multple comorbid conditions.     Critical care was time spent personally by me on the following  activities:  Development of treatment plan with patient or surrogate,  discussions with consultants, evaluation of patient's response to  treatment, examination of patient, obtaining history from patient or  surrogate, ordering and performing treatments and interventions, ordering  and review of laboratory studies and re-evaluation of patient's condition   I assumed direction of critical care for this patient from another  provider in my specialty: no

## 2019-06-16 NOTE — Consult Note (Signed)
PHARMACY CONSULT NOTE - FOLLOW UP  Pharmacy Consult for Electrolyte Monitoring and Replacement   53 y/o M admitted with right lower extremity ischemia and right ankle fracture now s/p peripheral revascularization and ORIF. Patient has a history significant for schizophrenia and substance abuse. Post-operatively he became extremely agitated likely secondary to drug/alcohol withdrawal requiring high doses of sedatives. He is currently intubated and sedated in the ICU.   Recent Labs: Potassium (mmol/L)  Date Value  06/16/2019 4.7   Magnesium (mg/dL)  Date Value  06/16/2019 2.1   Calcium (mg/dL)  Date Value  06/16/2019 8.0 (L)   Albumin (g/dL)  Date Value  06/15/2019 2.7 (L)   Phosphorus (mg/dL)  Date Value  06/16/2019 4.6   Sodium (mmol/L)  Date Value  06/16/2019 138    Electrolytes: Patient has a history of alcohol abuse. Baseline nutritional status un-clear. On tube feeds.Goal of therapy to replete electrolytes to normal levels. Patient with good UOP. Multiple bowel movements in last 12h. Sodium trending up. Phosphorous on lower end of normal today.    -No replacement warranted.   -Continue free water flushes 100 mL q4h   -BMP, Mg, Phos tomorrow AM  Constipation: Patient received bisacodyl suppository x1 9/29. He has had multiple stool outputs documented in chart since then. Constipating medications include hydromorphone and doxepin. Per discussion on rounds, plan to taper doxepin dose by 25% every 5 days. Rectal tube was placed on 09/29.     -Continue bisacodyl suppository PRN  Glucose: No baseline A1c in chart and no history of diabetes recorded. Currently on tube feeds. Received one dose of methylprednisolone 125 mg on 9/27. Medications that could contribute to development of new-onset diabetes include quetiapine. Glucoses have been normal with a few mild hyperglycemic readings which may be associated with steroids.     -Continue to monitor for  hyper/hypoglycemia   Simpson,Michael L 06/16/2019 3:33 PM

## 2019-06-16 NOTE — Progress Notes (Signed)
Subjective:  Patient remains intubated.  He is 8 days out from ORIF of a bimalleolar ankle fracture by Dr. Deeann Saint.  Objective:   VITALS:   Vitals:   06/16/19 0300 06/16/19 0456 06/16/19 0500 06/16/19 0600  BP:   (!) 85/66 108/78  Pulse:   65 (!) 102  Resp:      Temp: 98.8 F (37.1 C) 98.7 F (37.1 C)    TempSrc: Axillary Axillary    SpO2:   99% 97%  Weight:      Height:        PHYSICAL EXAM: Right lower extremity: Patient intubated and unable to participate with exam. Cast intact.  Toes are well-perfused.  LABS  Results for orders placed or performed during the hospital encounter of 06/07/19 (from the past 24 hour(s))  Comprehensive metabolic panel     Status: Abnormal   Collection Time: 06/15/19  9:25 PM  Result Value Ref Range   Sodium 137 135 - 145 mmol/L   Potassium 4.0 3.5 - 5.1 mmol/L   Chloride 101 98 - 111 mmol/L   CO2 27 22 - 32 mmol/L   Glucose, Bld 127 (H) 70 - 99 mg/dL   BUN 13 6 - 20 mg/dL   Creatinine, Ser 2.58 (L) 0.61 - 1.24 mg/dL   Calcium 8.3 (L) 8.9 - 10.3 mg/dL   Total Protein 6.3 (L) 6.5 - 8.1 g/dL   Albumin 2.7 (L) 3.5 - 5.0 g/dL   AST 26 15 - 41 U/L   ALT 28 0 - 44 U/L   Alkaline Phosphatase 92 38 - 126 U/L   Total Bilirubin 0.8 0.3 - 1.2 mg/dL   GFR calc non Af Amer >60 >60 mL/min   GFR calc Af Amer >60 >60 mL/min   Anion gap 9 5 - 15  Magnesium     Status: None   Collection Time: 06/15/19  9:25 PM  Result Value Ref Range   Magnesium 2.0 1.7 - 2.4 mg/dL  Phosphorus     Status: None   Collection Time: 06/15/19  9:25 PM  Result Value Ref Range   Phosphorus 3.4 2.5 - 4.6 mg/dL  CBC with Differential/Platelet     Status: Abnormal   Collection Time: 06/16/19  4:26 AM  Result Value Ref Range   WBC 5.5 4.0 - 10.5 K/uL   RBC 3.18 (L) 4.22 - 5.81 MIL/uL   Hemoglobin 10.6 (L) 13.0 - 17.0 g/dL   HCT 52.7 (L) 78.2 - 42.3 %   MCV 101.9 (H) 80.0 - 100.0 fL   MCH 33.3 26.0 - 34.0 pg   MCHC 32.7 30.0 - 36.0 g/dL   RDW 53.6 14.4 - 31.5  %   Platelets 182 150 - 400 K/uL   nRBC 0.0 0.0 - 0.2 %   Neutrophils Relative % 59 %   Neutro Abs 3.3 1.7 - 7.7 K/uL   Lymphocytes Relative 24 %   Lymphs Abs 1.3 0.7 - 4.0 K/uL   Monocytes Relative 11 %   Monocytes Absolute 0.6 0.1 - 1.0 K/uL   Eosinophils Relative 4 %   Eosinophils Absolute 0.2 0.0 - 0.5 K/uL   Basophils Relative 1 %   Basophils Absolute 0.0 0.0 - 0.1 K/uL   Immature Granulocytes 1 %   Abs Immature Granulocytes 0.05 0.00 - 0.07 K/uL  Basic metabolic panel     Status: Abnormal   Collection Time: 06/16/19  4:26 AM  Result Value Ref Range   Sodium 138 135 - 145 mmol/L  Potassium 4.7 3.5 - 5.1 mmol/L   Chloride 106 98 - 111 mmol/L   CO2 27 22 - 32 mmol/L   Glucose, Bld 111 (H) 70 - 99 mg/dL   BUN 13 6 - 20 mg/dL   Creatinine, Ser 0.64 0.61 - 1.24 mg/dL   Calcium 8.0 (L) 8.9 - 10.3 mg/dL   GFR calc non Af Amer >60 >60 mL/min   GFR calc Af Amer >60 >60 mL/min   Anion gap 5 5 - 15  Magnesium     Status: None   Collection Time: 06/16/19  4:26 AM  Result Value Ref Range   Magnesium 2.1 1.7 - 2.4 mg/dL  Phosphorus     Status: None   Collection Time: 06/16/19  4:26 AM  Result Value Ref Range   Phosphorus 4.6 2.5 - 4.6 mg/dL  Cortisol     Status: None   Collection Time: 06/16/19  4:26 AM  Result Value Ref Range   Cortisol, Plasma 6.1 ug/dL  Osmolality     Status: None   Collection Time: 06/16/19  4:26 AM  Result Value Ref Range   Osmolality 289 275 - 295 mOsm/kg  Sodium, urine, random     Status: None   Collection Time: 06/16/19  6:22 AM  Result Value Ref Range   Sodium, Ur 93 mmol/L  Osmolality, urine     Status: Abnormal   Collection Time: 06/16/19  6:22 AM  Result Value Ref Range   Osmolality, Ur 249 (L) 300 - 900 mOsm/kg    Dg Chest Port 1 View  Result Date: 06/15/2019 CLINICAL DATA:  Acute respiratory failure and hypoxia. EXAM: PORTABLE CHEST 1 VIEW COMPARISON:  Chest radiograph dated 06/13/2019. FINDINGS: An endotracheal tube terminates in the  midthoracic trachea. An enteric tube enters the stomach and terminates below the field of view. A right upper extremity central venous catheter tip overlies the superior cavoatrial junction. Cervical spinal fusion hardware is seen. Bilateral interstitial and airspace opacities are not significantly changed since prior exam and are most significant in the right lower lung. There is no pleural effusion or pneumothorax. The cardiomediastinal silhouette is unchanged. IMPRESSION: Unchanged bilateral interstitial and airspace opacities which are most significant in the right lower lung. Electronically Signed   By: Zerita Boers M.D.   On: 06/15/2019 08:39    Assessment/Plan: 8 Days Post-Op   Principal Problem:   Ischemia of extremity Active Problems:   Schizoaffective disorder, bipolar type ([place])   Acute respiratory failure ([place])  Continue chest treatment for right lower extremity.  Begin physical therapy once extubated and medically appropriate.  Patient is nonweightbearing on the right lower extremity.  Continue Lovenox for DVT prophylaxis.    Thornton Park , MD 06/16/2019, 1:56 PM

## 2019-06-16 NOTE — Progress Notes (Signed)
[employer] - [place] at [facility]   PATIENT NAME: Thomas [name]    MR#:  [id number]  DATE OF BIRTH:  1966/05/11  SUBJECTIVE:  CHIEF COMPLAINT:   Chief Complaint  Patient presents with  . Fall   -Patient is intubated and sedated.  Admitted for fall and right calcaneal fracture.  Going through alcohol withdrawals.  On Versed, Precedex and Dilaudid drips  REVIEW OF SYSTEMS:  Review of Systems  Unable to perform ROS: Critical illness    DRUG ALLERGIES:   Allergies  Allergen Reactions  . Haldol [Haloperidol Lactate]     VITALS:  Blood pressure 108/78, pulse (!) 102, temperature 98.7 F (37.1 C), temperature source Axillary, resp. rate 19, height 6' 0.01" (1.829 m), weight 76.6 kg, SpO2 97 %.  PHYSICAL EXAMINATION:  Physical Exam  GENERAL:  53 y.o.-year-old patient lying in the bed with no acute distress.  EYES: Pupils equal, round, reactive to light and accommodation. No scleral icterus. Extraocular muscles intact.  HEENT: Head atraumatic, normocephalic. Oropharynx and nasopharynx clear.  NECK:  Supple, no jugular venous distention. No thyroid enlargement, no tenderness.  LUNGS: Normal breath sounds bilaterally, no wheezing, rales,rhonchi or crepitation. No use of accessory muscles of respiration.  Decreased bibasilar breath sounds CARDIOVASCULAR: S1, S2 normal. No murmurs, rubs, or gallops.  ABDOMEN: Soft, nontender, nondistended. Bowel sounds present. No organomegaly or mass.  EXTREMITIES: No  cyanosis, or clubbing.  Right leg and ankle in a cast.  Cool to touch toes, capillary refill is noted. NEUROLOGIC: Unable to assess neurologically as patient is sedated PSYCHIATRIC: The patient is sedated SKIN: No obvious rash, lesion, or ulcer.    LABORATORY PANEL:   CBC Recent Labs  Lab 06/16/19 0426  WBC 5.5  HGB 10.6*  HCT 32.4*  PLT 182    ------------------------------------------------------------------------------------------------------------------  Chemistries  Recent Labs  Lab 06/15/19 2125 06/16/19 0426  NA 137 138  K 4.0 4.7  CL 101 106  CO2 27 27  GLUCOSE 127* 111*  BUN 13 13  CREATININE 0.60* 0.64  CALCIUM 8.3* 8.0*  MG 2.0 2.1  AST 26  --   ALT 28  --   ALKPHOS 92  --   BILITOT 0.8  --    ------------------------------------------------------------------------------------------------------------------  Cardiac Enzymes No results for input(s): TROPONINI in the last 168 hours. ------------------------------------------------------------------------------------------------------------------  RADIOLOGY:  Dg Chest Port 1 View  Result Date: 06/15/2019 CLINICAL DATA:  Acute respiratory failure and hypoxia. EXAM: PORTABLE CHEST 1 VIEW COMPARISON:  Chest radiograph dated 06/13/2019. FINDINGS: An endotracheal tube terminates in the midthoracic trachea. An enteric tube enters the stomach and terminates below the field of view. A right upper extremity central venous catheter tip overlies the superior cavoatrial junction. Cervical spinal fusion hardware is seen. Bilateral interstitial and airspace opacities are not significantly changed since prior exam and are most significant in the right lower lung. There is no pleural effusion or pneumothorax. The cardiomediastinal silhouette is unchanged. IMPRESSION: Unchanged bilateral interstitial and airspace opacities which are most significant in the right lower lung. Electronically Signed   By: Romona Curls M.D.   On: 06/15/2019 08:39    EKG:   Orders placed or performed during the hospital encounter of 06/07/19  . EKG 12-Lead  . EKG 12-Lead    ASSESSMENT AND PLAN:   JamesRhodesis a53 y.o.malewith a known history of polysubstance abuse including alcohol,schizoaffective bipolar type,seizures,hypertension and insomnia who initially presented on 06/07/2019  following an episode of a fall. Was evaluated in the emergency room  and found to have right ankle fracture as well as right foot ischemia.Went into alcohol withdrawal and currently sedated on the vent  1.Acute hypoxic respiratory failure with acute encephalopathy with agitation  -Patient went into alcohol withdrawal requiring several sedatives and subsequently intubated for hypoxic respiratory failure and airway protection -Remains sedated on the vent. -On minimal vent settings. -Being managed by ICU team--continue to wean as tolerated  2.Right lower extremity ischemia -status post revascularization with angioplasty and stent placement by vascular surgery in right superficial femoral artery, right external iliac artery. -Continue aspirin and Plavix  3.Right ankle fracture. Status post fall Patient status post open reduction with internal fixation of right ankle by orthopedic physician. Day 8 postop -Patient will need physical therapy once extubated -On Dilaudid drip for pain  4.History of schizoaffective bipolar type -Currently sedated on the vent. -Being followed by psychiatry service and managing psych meds.  5.Alcohol abuse now with delirium tremens [Patient requiring sedation on the vent.  He is on Precedex drip and low-dose Versed drip [Continue thiamine,multivitamin and folic acid. -Started on a tapering dose of doxepin  6.   aspiration pneumonia-continue Unasyn  7. DVT prophylaxis;Lovenox      All the records are reviewed and case discussed with Care Management/Social Workerr. Management plans discussed with the patient, family and they are in agreement.  CODE STATUS: Full code  TOTAL TIME TAKING CARE OF THIS PATIENT: 39 minutes.   POSSIBLE D/C IN ? DAYS, DEPENDING ON CLINICAL CONDITION.   Gladstone Lighter M.D on 06/16/2019 at 9:02 AM  Between 7am to 6pm - Pager - [phone]  After 6pm go to [email or web address] - password [email or web address] [facility]  Office  [phone]  CC: Primary care physician; Patient, [name]

## 2019-06-17 LAB — CULTURE, RESPIRATORY W GRAM STAIN

## 2019-06-17 LAB — BASIC METABOLIC PANEL
Anion gap: 7 (ref 5–15)
BUN: 21 mg/dL — ABNORMAL HIGH (ref 6–20)
CO2: 27 mmol/L (ref 22–32)
Calcium: 8.6 mg/dL — ABNORMAL LOW (ref 8.9–10.3)
Chloride: 103 mmol/L (ref 98–111)
Creatinine, Ser: 0.5 mg/dL — ABNORMAL LOW (ref 0.61–1.24)
GFR calc Af Amer: >60 mL/min (ref >60)
GFR calc non Af Amer: >60 mL/min (ref >60)
Glucose, Bld: 183 mg/dL — ABNORMAL HIGH (ref 70–99)
Potassium: 4.2 mmol/L (ref 3.5–5.1)
Sodium: 137 mmol/L (ref 135–145)

## 2019-06-17 LAB — HIV-1 RNA, PCR (GRAPH) RFX/GENO EDI
HIV-1 RNA BY PCR: <20 copies/mL
HIV-1 RNA Quant, Log: UNDETERMINED log10copy/mL

## 2019-06-17 MED ORDER — GABAPENTIN 250 MG/5ML PO SOLN
300.0000 mg | Freq: Three times a day (TID) | ORAL | Status: DC
  Administered 2019-06-17 – 2019-06-20 (×8): 300 mg via ORAL
  Filled 2019-06-17 (×13): qty 6

## 2019-06-17 MED ORDER — FENTANYL CITRATE (PF) 100 MCG/2ML IJ SOLN
INTRAMUSCULAR | Status: AC
  Filled 2019-06-17: qty 2

## 2019-06-17 MED ORDER — FENTANYL CITRATE (PF) 100 MCG/2ML IJ SOLN
50.0000 ug | Freq: Once | INTRAMUSCULAR | Status: AC
  Administered 2019-06-17: 12:00:00 50 ug via INTRAVENOUS

## 2019-06-17 MED ORDER — HYDROMORPHONE BOLUS VIA INFUSION
0.5000 mg | INTRAVENOUS | Status: DC | PRN
  Administered 2019-06-17 – 2019-06-18 (×8): 0.5 mg via INTRAVENOUS
  Filled 2019-06-17: qty 1

## 2019-06-17 MED ORDER — SODIUM CHLORIDE 0.9 % IV SOLN
INTRAVENOUS | Status: DC | PRN
  Administered 2019-06-18 – 2019-06-27 (×4): 250 mL via INTRAVENOUS
  Administered 2019-06-29: 500 mL via INTRAVENOUS

## 2019-06-17 NOTE — Progress Notes (Signed)
Better afternoon. Calmer with increased sedation. Weaning slowly. Continues to have periods of agitation but patient calms quickly. Remains in SR this pm.

## 2019-06-17 NOTE — Progress Notes (Signed)
RT and RN changed ETT tube holder and suction catheter. Patient was suctioned with copious amounts of thick pink tinged/tan secretions; suction catheter, tubing and HME were exchanged for new/clean. Patient tolerated exchange well. ETT secured. Patient resting comfortably in bed.

## 2019-06-17 NOTE — Consult Note (Signed)
PHARMACY CONSULT NOTE - FOLLOW UP  Pharmacy Consult for Electrolyte Monitoring and Replacement   53 y/o M admitted with right lower extremity ischemia and right ankle fracture now s/p peripheral revascularization and ORIF. Patient has a history significant for schizophrenia and substance abuse. Post-operatively he became extremely agitated likely secondary to drug/alcohol withdrawal requiring high doses of sedatives. He is currently intubated and sedated in the ICU.   Recent Labs: Potassium (mmol/L)  Date Value  06/17/2019 4.2   Magnesium (mg/dL)  Date Value  06/16/2019 2.1   Calcium (mg/dL)  Date Value  06/17/2019 8.6 (L)   Albumin (g/dL)  Date Value  06/15/2019 2.7 (L)   Phosphorus (mg/dL)  Date Value  06/16/2019 4.6   Sodium (mmol/L)  Date Value  06/17/2019 137    Electrolytes: Patient has a history of alcohol abuse. Baseline nutritional status un-clear. On tube feeds.Goal of therapy to replete electrolytes to normal levels. Patient with good UOP. Multiple bowel movements in last 12h. Sodium trending up. Phosphorous on lower end of normal today.    -No replacement warranted.   -Continue free water flushes 100 mL q4h   -BMP, Mg, Phos tomorrow AM  Constipation: Patient received bisacodyl suppository x1 9/29. He has had multiple stool outputs documented in chart since then. Constipating medications include hydromorphone and doxepin. Per discussion on rounds, plan to taper doxepin dose by 25% every 5 days. Rectal tube was placed on 09/29.     -Continue bisacodyl suppository PRN  Glucose: No baseline A1c in chart and no history of diabetes recorded. Currently on tube feeds. Received one dose of methylprednisolone 125 mg on 9/27. Medications that could contribute to development of new-onset diabetes include quetiapine. Patient received methylprednisolone 60mg  IV Q6hr x 4 doses. Blood glucose elevated on morning BMP.       -Will monitor glucose on morning BMP and if remains  elevated will start SSI on 10/5.   Pharmacy will continue to monitor and adjust per consult.   Simpson,Michael L 06/17/2019 10:56 AM

## 2019-06-17 NOTE — Progress Notes (Signed)
9 Days Post-Op   Subjective/Chief Complaint: Continued significant DTs, agitation, remains intubated.   Objective: Vital signs in last 24 hours: Temp:  [97.7 F (36.5 C)-98.7 F (37.1 C)] 98.2 F (36.8 C) (10/04 0800) Pulse Rate:  [51-139] 105 (10/04 0900) Resp:  [16] 16 (10/04 0000) BP: (107-153)/(60-116) 153/89 (10/04 0900) SpO2:  [97 %-100 %] 98 % (10/04 0900) FiO2 (%):  [28 %] 28 % (10/04 0400) Last BM Date: 06/16/19  Intake/Output from previous day: 10/03 0701 - 10/04 0700 In: 1895.5 [I.V.:720.5; NG/GT:1095] Out: 4675 [Urine:4475; Stool:200] Intake/Output this shift: Total I/O In: 274.9 [I.V.:69.9; Other:60; NG/GT:145] Out: 350 [Urine:350]  General appearance: combative Resp: clear to auscultation bilaterally Cardio: tachycardia Extremities: cast in place, toes warm, groin site soft  Lab Results:  Recent Labs    06/15/19 0512 06/16/19 0426  WBC 4.5 5.5  HGB 9.6* 10.6*  HCT 29.8* 32.4*  PLT 130* 182   BMET Recent Labs    06/16/19 0426 06/17/19 0424  NA 138 137  K 4.7 4.2  CL 106 103  CO2 27 27  GLUCOSE 111* 183*  BUN 13 21*  CREATININE 0.64 0.50*  CALCIUM 8.0* 8.6*   PT/INR No results for input(s): LABPROT, INR in the last 72 hours. ABG No results for input(s): PHART, HCO3 in the last 72 hours.  Invalid input(s): PCO2, PO2  Studies/Results: No results found.  Anti-infectives: Anti-infectives (From admission, onward)   Start     Dose/Rate Route Frequency Ordered Stop   06/16/19 1000  levofloxacin (LEVAQUIN) IVPB 750 mg     750 mg 100 mL/hr over 90 Minutes Intravenous Every 24 hours 06/16/19 0906 06/16/19 1955   06/13/19 1600  Ampicillin-Sulbactam (UNASYN) 3 g in sodium chloride 0.9 % 100 mL IVPB  Status:  Discontinued     3 g 200 mL/hr over 30 Minutes Intravenous Every 6 hours 06/13/19 1444 06/16/19 0906   06/08/19 1800  ceFAZolin (ANCEF) IVPB 2g/100 mL premix     2 g 200 mL/hr over 30 Minutes Intravenous Every 8 hours 06/08/19 1730  06/09/19 1900   06/08/19 1800  clindamycin (CLEOCIN) IVPB 600 mg     600 mg 100 mL/hr over 30 Minutes Intravenous Every 8 hours 06/08/19 1730 06/09/19 2159   06/08/19 0000  ceFAZolin (ANCEF) IVPB 1 g/50 mL premix  Status:  Discontinued    Note to Pharmacy: Send with pt to OR   1 g 100 mL/hr over 30 Minutes Intravenous On call 06/07/19 2030 06/07/19 2251      Assessment/Plan: s/p Procedure(s): OPEN REDUCTION INTERNAL FIXATION (ORIF) ANKLE FRACTURE (Right)  POD # 9 S/P: Percutaneous transluminal angioplasty and stent placementrightsuperficial femoral artery  Percutaneous transluminal angioplasty and stent placement right external iliac artery  Plan:  Continue Plavix daily.  Monitor capillary refill of toes on right. Further management per primary and Critical Care. No further Vascular Surgery recommendations or interventions at this time.  LOS: 10 days    Jamesetta So A 06/17/2019

## 2019-06-17 NOTE — Progress Notes (Signed)
Patient continues to be intubated.  Consulted with Dr Darleene Cleaver for medication recommendations to prevent withdrawal symptoms after extubating the patient.  He recommends to start gabapentin 300 mg TID (with the ability to increase) to help with symptoms and prevent withdrawal seizures.  Start Ativan 1 mg TID or QID prior to extubating to decrease withdrawal seizures based on respiratory status to prevent respiratory depression.  Psych will continue to follow.  [name], PMHNP

## 2019-06-17 NOTE — Progress Notes (Signed)
MRN: [id number] DOB: 04-Mar-1966     CONSULTATION DATE: 06/15/2019  CHIEF COMPLAINT:  Respiratory failure  SYNOPSIS:  Thomas [name] is a 53 yo male with an extensive psychiatric history and polysubstance abuse presented to the ED following a fall at home with right ankle fracture and right lower extremity ischemia and inability to locate pulse with doppler. After admission to the ICU, pt began exhibiting sx of drug/alchol withdrawal syndrome requiring multiple sedatives and leading to respiratory failure requiring intubation 9/27. CXR suspicious for right lower lobe pneumonia.  SIGNIFICANT EVENTS: 9/24- Presented to ED with displaced right ankle fracture and limb ischemia 9/24- Vascular surgery to revascularize right lower extremity 9/25- Open reduction internal fixation of right ankle fracture medially and laterally 9/26- patient on precedex and multiple PRN IV meds still tachycardic with episodes of aggitation and combative/aggresive behavior. Concern for loss of airway protection 9/27- patient attempting to physically attack staff and loudly verbally abusive, he is in high doses of IV sedation. After additional PRN IV medications patient temporarilly sedated with episodic apnea. Pt sedated and intubated. 9/28 failed SAT due to severe agitation and delirium 9/29PICC line placed 9/30 failed SAT again for severe agitation and delirium 9/30 CXR suspicious for right lower lobe pneumonia, began treatment with Unasyn 10/2 Pt failed spontaneous breathing trial with sedation 10/3 -discussed case today with mother Thomas [name] and common-law wife Thomas [name], they report patient had seizures few days prior to this admissions due to alcohol withdrawal, they also report previous admission to [facility] for over 2 months due to refractory seizures with severe delirium tremens resulting in fall and cervical neck fracture status post neurosurgical intervention.  Am very concerned that if patient becomes combative  again he might refracture his right lower extremity since he has attempted to kick staff with it, additionally he requires multiple staff members to be present for redirection and protect him from self injury.  I discussed this with charge nurse and we will likely need to wait till Monday to have police deputy present at all times post extubation to control violent behavior.  I have discussed this with psychiatry nurse practitioner [name] and she will have discourse with psychiatrist regarding additional options. 10/4 - discussed again with psychiatry, adding gabapentin 300tid.  Pt will need police deputy present at all times post extubation.   HISTORY OF PRESENT ILLNESS:   Pt currently on vent support and sedated. Failed SBT today because he became apneic without vent support. May try SAT to reassess agitation.  PAST MEDICAL HISTORY :   has a past medical history of Alcohol abuse, Anemia, Bipolar 1 disorder ([place]), Cervical spine fracture ([place]), Chronic, continuous use of opioids, History of seizure, HTN (hypertension), Insomnia, MDD (major depressive disorder), recurrent, severe, with psychosis ([place]), and Pancreatitis.  has a past surgical history that includes Lower Extremity Angiography (Right, 06/07/2019) and ORIF ankle fracture (Right, 06/08/2019). Prior to Admission medications   Medication Sig Start Date End Date Taking? Authorizing Provider  albuterol (VENTOLIN HFA) 108 (90 Base) MCG/ACT inhaler Inhale 2 puffs into the lungs every 6 (six) hours as needed for wheezing. 02/24/19  Yes [provider]  doxepin (SINEQUAN) 100 MG capsule Take 200 mg by mouth at bedtime. 04/26/19  Yes [provider]  pregabalin (LYRICA) 200 MG capsule Take 200 mg by mouth 3 (three) times daily. 01/25/19  Yes [provider]  SUBOXONE 8-2 MG FILM Place 1 Film under the tongue 3 (three) times daily. 05/22/19  Yes [provider]  Allergies  Allergen Reactions  . Haldol  [Haloperidol Lactate]    REVIEW OF SYSTEMS:   Unable to obtain due to critical illness  VITAL SIGNS: Temp:  [97.7 F (36.5 C)-98.5 F (36.9 C)] 98.4 F (36.9 C) (10/04 1600) Pulse Rate:  [51-139] 92 (10/04 1600) Resp:  [16] 16 (10/04 0000) BP: (107-153)/(60-116) 131/87 (10/04 1600) SpO2:  [96 %-100 %] 100 % (10/04 1600) FiO2 (%):  [28 %] 28 % (10/04 1633)   I/O last 3 completed shifts: In: 2426.6 [I.V.:1053.6; Other:80; NG/GT:1095; IV Piggyback:197.9] Out: 6100 [Urine:5900; Stool:200] Total I/O In: 367.8 [I.V.:162.8; Other:60; NG/GT:145] Out: 1150 [Urine:1150]   SpO2: 100 % O2 Flow Rate (L/min): 6 L/min FiO2 (%): 28 %  Vent Mode: PRVC FiO2 (%):  [28 %] 28 % Set Rate:  [16 bmp] 16 bmp Vt Set:  [500 mL] 500 mL PEEP:  [5 cmH20] 5 cmH20 Plateau Pressure:  [13 cmH20-14 cmH20] 14 cmH20   Physical Examination:  GENERAL:critically ill appearing, intubated and sedated HEAD: Normocephalic, atraumatic.  EYES: Pupils equal, round, reactive to light.  No scleral icterus.  MOUTH: Moist mucosal membrane. NECK: Supple. No JVD.  PULMONARY: lungs clear to auscultation CARDIOVASCULAR: S1 and S2. Regular rate and rhythm. No murmurs, rubs, or gallops.  GASTROINTESTINAL: Soft, nontender, -distended. No masses. Positive bowel sounds. No hepatosplenomegaly.  MUSCULOSKELETAL: No swelling, clubbing, or edema.  NEUROLOGIC: obtunded, RASS -4 SKIN:intact,warm,dry  I personally reviewed lab work that was obtained in last 24 hrs. CXR Independently reviewed  MEDICATIONS: I have reviewed all medications and confirmed regimen as documented   CULTURE RESULTS   Recent Results (from the past 240 hour(s))  SARS Coronavirus 2 (Hospital order, Performed in [facility] hospital lab) Nasopharyngeal Nasopharyngeal Swab     Status: None   Collection Time: 06/07/19  6:34 PM   Specimen: Nasopharyngeal Swab  Result Value Ref Range Status   SARS Coronavirus 2 NEGATIVE NEGATIVE Final    Comment:  (NOTE) If result is NEGATIVE SARS-CoV-2 target nucleic acids are NOT DETECTED. The SARS-CoV-2 RNA is generally detectable in upper and lower  respiratory specimens during the acute phase of infection. The lowest  concentration of SARS-CoV-2 viral copies this assay can detect is 250  copies / mL. A negative result does not preclude SARS-CoV-2 infection  and should not be used as the sole basis for treatment or other  patient management decisions.  A negative result may occur with  improper specimen collection / handling, submission of specimen other  than nasopharyngeal swab, presence of viral mutation(s) within the  areas targeted by this assay, and inadequate number of viral copies  (<250 copies / mL). A negative result must be combined with clinical  observations, patient history, and epidemiological information. If result is POSITIVE SARS-CoV-2 target nucleic acids are DETECTED. The SARS-CoV-2 RNA is generally detectable in upper and lower  respiratory specimens dur ing the acute phase of infection.  Positive  results are indicative of active infection with SARS-CoV-2.  Clinical  correlation with patient history and other diagnostic information is  necessary to determine patient infection status.  Positive results do  not rule out bacterial infection or co-infection with other viruses. If result is PRESUMPTIVE POSTIVE SARS-CoV-2 nucleic acids MAY BE PRESENT.   A presumptive positive result was obtained on the submitted specimen  and confirmed on repeat testing.  While 2019 novel coronavirus  (SARS-CoV-2) nucleic acids may be present in the submitted sample  additional confirmatory testing may be necessary for epidemiological  and /   or clinical management purposes  to differentiate between  SARS-CoV-2 and other Sarbecovirus currently known to infect humans.  If clinically indicated additional testing with an alternate test  methodology [phone]) is advised. The SARS-CoV-2 RNA is  generally  detectable in upper and lower respiratory sp ecimens during the acute  phase of infection. The expected result is Negative. Fact Sheet for Patients:  StrictlyIdeas.no Fact Sheet for Healthcare Providers: BankingDealers.co.za This test is not yet approved or cleared by the [place] FDA and has been authorized for detection and/or diagnosis of SARS-CoV-2 by FDA under an Emergency Use Authorization (EUA).  This EUA will remain in effect (meaning this test can be used) for the duration of the COVID-19 declaration under Section 564(b)(1) of the Act, 21 U.S.C. section 360bbb-3(b)(1), unless the authorization is terminated or revoked sooner. Performed at [facility], [address] [place], [place] [address]   MRSA PCR Screening     Status: None   Collection Time: 06/08/19  5:53 AM   Specimen: Nasal Mucosa; Nasopharyngeal  Result Value Ref Range Status   MRSA by PCR NEGATIVE NEGATIVE Final    Comment:        The GeneXpert MRSA Assay (FDA approved for NASAL specimens only), is one component of a comprehensive MRSA colonization surveillance program. It is not intended to diagnose MRSA infection nor to guide or monitor treatment for MRSA infections. Performed at [facility], [address] [place], [place] [address]   Culture, respiratory     Status: None   Collection Time: 06/13/19  3:28 PM   Specimen: Tracheal Aspirate  Result Value Ref Range Status   Specimen Description   Final    TRACHEAL ASPIRATE Performed at [facility], [address], [place], [place] [address]    Special Requests   Final    NONE Performed at [facility], [address] [place], [place] [address]    Gram Stain   Final    ABUNDANT WBC PRESENT, PREDOMINANTLY PMN ABUNDANT GRAM POSITIVE COCCI RARE GRAM NEGATIVE RODS Performed at [facility], [place] [address], [place], [place] [address]     Culture   Final    ABUNDANT KLEBSIELLA OXYTOCA ABUNDANT STENOTROPHOMONAS MALTOPHILIA    Report Status 06/17/2019 FINAL  Final   Organism ID, Bacteria KLEBSIELLA OXYTOCA  Final   Organism ID, Bacteria STENOTROPHOMONAS MALTOPHILIA  Final      Susceptibility   Klebsiella oxytoca - MIC*    AMPICILLIN >=32 RESISTANT Resistant     CEFAZOLIN >=64 RESISTANT Resistant     CEFEPIME <=1 SENSITIVE Sensitive     CEFTAZIDIME <=1 SENSITIVE Sensitive     CEFTRIAXONE <=1 SENSITIVE Sensitive     CIPROFLOXACIN <=0.25 SENSITIVE Sensitive     GENTAMICIN <=1 SENSITIVE Sensitive     IMIPENEM <=0.25 SENSITIVE Sensitive     TRIMETH/SULFA <=20 SENSITIVE Sensitive     AMPICILLIN/SULBACTAM 4 SENSITIVE Sensitive     PIP/TAZO <=4 SENSITIVE Sensitive     Extended ESBL NEGATIVE Sensitive     * ABUNDANT KLEBSIELLA OXYTOCA   Stenotrophomonas maltophilia - MIC*    LEVOFLOXACIN 0.25 SENSITIVE Sensitive     TRIMETH/SULFA <=20 SENSITIVE Sensitive     * ABUNDANT STENOTROPHOMONAS MALTOPHILIA          IMAGING    No results found.      Indwelling Urinary Catheter continued, requirement due to   Reason to continue Indwelling Urinary Catheter strict Intake/Output monitoring for hemodynamic instability   Central Line/  continued, requirement due to  Reason to continue Centra Line Monitoring of central venous pressure or other hemodynamic parameters and poor IV access   Ventilator continued, requirement due to severe respiratory failure   Ventilator Sedation RASS 0 to -2      ASSESSMENT AND PLAN SYNOPSIS 53 yo male with significant psychiatric history presents to ICU in acute respiratory failure following agitation and aggression towards staff requiring sedation. Respiratory failure likely also from right lower lobe aspiration pneumonia. Originally, pt here because of right ankle displaced fracture and limb ischemia requiring orthopedic and vascular surgery.  Severe ACUTE Hypoxic Respiratory Failure CXR  suggestive of RLL aspiration pneumonia vs pulmonary edema, will attempt to diurese today -mucopurulent sections improved.  -continue Full MV support -continue Bronchodilator Therapy -Wean Fio2 and PEEP as tolerated -will perform SAT/SBT when respiratory parameters are met  Right lower lobe aspiration pneumonia -continue IV abx as prescibed -follow up cultures  ETOH abuse with Severe Delirium Tremens -patient remains very agitated- will reassess with SAT today -needs ICU monitoring -CIWA protocol  NEUROLOGY - intubated and sedated - minimal sedation to achieve a RASS goal: -1 - Wake up assessment pending Follow psych recommendations-gabapentin tid   CARDIAC ICU monitoring - Monitor QT prolongation  GI GI PROPHYLAXIS as indicated  NUTRITIONAL STATUS DIET-->TF's as tolerated Constipation protocol as indicated  ENDO - will use ICU hypoglycemic\Hyperglycemia protocol if needed  ELECTROLYTES -follow labs as needed -replace as needed -pharmacy consultation and following  DVT/GI PRX ordered TRANSFUSIONS AS NEEDED MONITOR FSBS ASSESS the need for LABS      , M.D.  Pulmonary & Critical Care Medicine  [facility]    Critical care provider statement:    Critical care time (minutes):  33   Critical care time was exclusive of:  Separately billable procedures and  treating other patients   Critical care was necessary to treat or prevent imminent or  life-threatening deterioration of the following conditions:  acute alcohol and drug withdrawal syndrome, bipolar disorder, critical limb ischemia or RLE, multple comorbid conditions.     Critical care was time spent personally by me on the following  activities:  Development of treatment plan with patient or surrogate,  discussions with consultants, evaluation of patient's response to  treatment, examination of patient, obtaining history from patient or  surrogate, ordering and performing treatments  and interventions, ordering  and review of laboratory studies and re-evaluation of patient's condition   I assumed direction of critical care for this patient from another  provider in my specialty: no          

## 2019-06-17 NOTE — Progress Notes (Addendum)
1140. Patient heart rate up to 150. Room entered to check on patient status. When room entered mother stated " He has aspirated. I heard him aspirate.!!" Patient and his mother both agitated. Patient breathing 30 + times a minute, sweating profusely and moving wildly in his bed. RT stat paged. Oxygen saturation was 100%. ETT cuff leak identified by a whistling sound coming from patients mouth. .5 cc of air added to ETT cuff. Sound stopped. Mother and patient still very agitated. Patient given all prns for sedation with no results. Dr. Lanney Gins consulted. Orders received and patient given extra 50 mcg of Fentanyl with no results.  Dr. Lanney Gins consulted again.  Sedation increased as ordered-see MAR. Finally at 1300 patient calm and sedated. At no time during this hour and 20 minute  agitation episode did the patients oxygen saturation drop below 100%.

## 2019-06-17 NOTE — Progress Notes (Signed)
[employer] [facility] [place] [facility]   PATIENT NAME: Thomas [name]    MR#:  [id number]  DATE OF BIRTH:  Feb 04, 1966  SUBJECTIVE:  CHIEF COMPLAINT:   Chief Complaint  Patient presents with  . Fall   -Patient is intubated and sedated.  Admitted for fall and right calcaneal fracture.  Going through alcohol withdrawals.  On Versed, Precedex and Dilaudid drips - very agitated when touched inspite of high dose sedation  REVIEW OF SYSTEMS:  Review of Systems  Unable to perform ROS: Critical illness    DRUG ALLERGIES:   Allergies  Allergen Reactions  . Haldol [Haloperidol Lactate]     VITALS:  Blood pressure (!) 153/89, pulse (!) 105, temperature 98.2 F (36.8 C), resp. rate 16, height 6' 0.01" (1.829 m), weight 76.6 kg, SpO2 98 %.  PHYSICAL EXAMINATION:  Physical Exam  GENERAL:  53 y.o.-year-old patient lying in the bed with no acute distress.  EYES: Pupils equal, round, reactive to light and accommodation. No scleral icterus. Extraocular muscles intact.  HEENT: Head atraumatic, normocephalic. Oropharynx and nasopharynx clear.  NECK:  Supple, no jugular venous distention. No thyroid enlargement, no tenderness.  LUNGS: Normal breath sounds bilaterally, no wheezing, rales,rhonchi or crepitation. No use of accessory muscles of respiration.  Decreased bibasilar breath sounds CARDIOVASCULAR: S1, S2 normal. No murmurs, rubs, or gallops.  ABDOMEN: Soft, nontender, nondistended. Bowel sounds present. No organomegaly or mass.  EXTREMITIES: No  cyanosis, or clubbing.  Right leg and ankle in a cast.  Cool to touch toes, capillary refill is noted. NEUROLOGIC: Unable to assess neurologically as patient is sedated PSYCHIATRIC: The patient is sedated SKIN: No obvious rash, lesion, or ulcer.    LABORATORY PANEL:   CBC Recent Labs  Lab 06/16/19 0426  WBC 5.5  HGB 10.6*  HCT 32.4*  PLT 182    ------------------------------------------------------------------------------------------------------------------  Chemistries  Recent Labs  Lab 06/15/19 2125 06/16/19 0426 06/17/19 0424  NA 137 138 137  K 4.0 4.7 4.2  CL 101 106 103  CO2 27 27 27   GLUCOSE 127* 111* 183*  BUN 13 13 21*  CREATININE 0.60* 0.64 0.50*  CALCIUM 8.3* 8.0* 8.6*  MG 2.0 2.1  --   AST 26  --   --   ALT 28  --   --   ALKPHOS 92  --   --   BILITOT 0.8  --   --    ------------------------------------------------------------------------------------------------------------------  Cardiac Enzymes No results for input(s): TROPONINI in the last 168 hours. ------------------------------------------------------------------------------------------------------------------  RADIOLOGY:  No results found.  EKG:   Orders placed or performed during the hospital encounter of 06/07/19  . EKG 12-Lead  . EKG 12-Lead    ASSESSMENT AND PLAN:   JamesRhodesis a53 y.o.malewith a known history of polysubstance abuse including alcohol,schizoaffective bipolar type,seizures,hypertension and insomnia who initially presented on 06/07/2019 following an episode of a fall. Was evaluated in the emergency room and found to have right ankle fracture as well as right foot ischemia.Went into alcohol withdrawal and currently sedated on the vent  1.Acute hypoxic respiratory failure with acute encephalopathy with agitation  -Patient went into alcohol withdrawal requiring several sedatives and subsequently intubated for hypoxic respiratory failure and airway protection -Remains sedated on the vent. -On minimal vent settings. -Being managed by ICU team--continue to wean as tolerated -During last hospitalization, he was hospitalized for 2 months for alcohol withdrawals.  2.Right lower extremity ischemia -status post revascularization with angioplasty and stent placement by vascular surgery in right  superficial femoral  artery, right external iliac artery. -Continue aspirin and Plavix  3.Right ankle fracture. Status post fall Patient status post open reduction with internal fixation of right ankle by orthopedic physician. Day 9 postop -Patient will need physical therapy once extubated -On Dilaudid drip for pain  4.History of schizoaffective bipolar type -Currently sedated on the vent. -Being followed by psychiatry service and managing psych meds.  Patient on Seroquel, Versed drip, Dilaudid drip, Precedex drip, sleep and taper  5.Alcohol abuse now with delirium tremens [Patient requiring sedation on the vent.  He is on Precedex drip and low-dose Versed drip [Continue thiamine,multivitamin and folic acid. -Started on a tapering dose of doxepin  6.   aspiration pneumonia-finished Unasyn.  7. DVT prophylaxis;Lovenox  Difficult to be weaned off vent due to significant withdrawals and behavior    All the records are reviewed and case discussed with Care Management/Social Workerr. Management plans discussed with the patient, family and they are in agreement.  CODE STATUS: Full code  TOTAL TIME TAKING CARE OF THIS PATIENT: 39 minutes.   POSSIBLE D/C IN ? DAYS, DEPENDING ON CLINICAL CONDITION.   Gladstone Lighter M.D on 06/17/2019 at 9:42 AM  Between 7am to 6pm - Pager - [phone]  After 6pm go to [email or web address] - password [email or web address] [facility]  Office  [phone]  CC: Primary care physician; Patient, [name]

## 2019-06-17 NOTE — Progress Notes (Signed)
Patient currently intubated due to severe withdrawal symptoms.  Psych will continue to follow.  Consulting today with Dr Darleene Cleaver regarding medication management for this high risk patient.  [name], PMHNP

## 2019-06-18 ENCOUNTER — Inpatient Hospital Stay: Payer: Medicaid Other

## 2019-06-18 DIAGNOSIS — J690 Pneumonitis due to inhalation of food and vomit: Secondary | ICD-10-CM

## 2019-06-18 DIAGNOSIS — F250 Schizoaffective disorder, bipolar type: Secondary | ICD-10-CM

## 2019-06-18 LAB — BASIC METABOLIC PANEL
Anion gap: 8 (ref 5–15)
BUN: 24 mg/dL — ABNORMAL HIGH (ref 6–20)
CO2: 29 mmol/L (ref 22–32)
Calcium: 8.8 mg/dL — ABNORMAL LOW (ref 8.9–10.3)
Chloride: 103 mmol/L (ref 98–111)
Creatinine, Ser: 0.58 mg/dL — ABNORMAL LOW (ref 0.61–1.24)
GFR calc Af Amer: >60 mL/min (ref >60)
GFR calc non Af Amer: >60 mL/min (ref >60)
Glucose, Bld: 154 mg/dL — ABNORMAL HIGH (ref 70–99)
Potassium: 3.9 mmol/L (ref 3.5–5.1)
Sodium: 140 mmol/L (ref 135–145)

## 2019-06-18 LAB — PHOSPHORUS: Phosphorus: 2.8 mg/dL (ref 2.5–4.6)

## 2019-06-18 LAB — CORTISOL: Cortisol, Plasma: 5.7 ug/dL

## 2019-06-18 LAB — CBC
HCT: 34.6 % — ABNORMAL LOW (ref 39.0–52.0)
Hemoglobin: 11.3 g/dL — ABNORMAL LOW (ref 13.0–17.0)
MCH: 33.2 pg (ref 26.0–34.0)
MCHC: 32.7 g/dL (ref 30.0–36.0)
MCV: 101.8 fL — ABNORMAL HIGH (ref 80.0–100.0)
Platelets: 254 10*3/uL (ref 150–400)
RBC: 3.4 MIL/uL — ABNORMAL LOW (ref 4.22–5.81)
RDW: 13 % (ref 11.5–15.5)
WBC: 9.5 10*3/uL (ref 4.0–10.5)
nRBC: 0 % (ref 0.0–0.2)

## 2019-06-18 LAB — HEPATIC FUNCTION PANEL
ALT: 52 U/L — ABNORMAL HIGH (ref 0–44)
AST: 49 U/L — ABNORMAL HIGH (ref 15–41)
Albumin: 2.8 g/dL — ABNORMAL LOW (ref 3.5–5.0)
Alkaline Phosphatase: 86 U/L (ref 38–126)
Bilirubin, Direct: <0.1 mg/dL (ref 0.0–0.2)
Total Bilirubin: 0.5 mg/dL (ref 0.3–1.2)
Total Protein: 5.9 g/dL — ABNORMAL LOW (ref 6.5–8.1)

## 2019-06-18 LAB — MAGNESIUM: Magnesium: 2 mg/dL (ref 1.7–2.4)

## 2019-06-18 MED ORDER — VITAMIN B-1 100 MG PO TABS: 100.0000 mg | ORAL_TABLET | Freq: Every day | ORAL | Status: DC

## 2019-06-18 MED ORDER — THIAMINE HCL 100 MG/ML IJ SOLN
500.0000 mg | Freq: Every day | INTRAVENOUS | Status: AC
  Administered 2019-06-18 – 2019-06-20 (×3): 500 mg via INTRAVENOUS
  Filled 2019-06-18 (×3): qty 5

## 2019-06-18 MED ORDER — MIDAZOLAM HCL 2 MG/2ML IJ SOLN
4.0000 mg | Freq: Once | INTRAMUSCULAR | Status: AC
  Administered 2019-06-18: 23:00:00 4 mg via INTRAVENOUS

## 2019-06-18 MED ORDER — NOREPINEPHRINE 4 MG/250ML-% IV SOLN
0.0000 ug/min | INTRAVENOUS | Status: DC
  Administered 2019-06-18: 2 ug/min via INTRAVENOUS
  Administered 2019-06-20: 8 ug/min via INTRAVENOUS
  Filled 2019-06-18 (×2): qty 250

## 2019-06-18 MED ORDER — MIDAZOLAM HCL 2 MG/2ML IJ SOLN
INTRAMUSCULAR | Status: AC
  Administered 2019-06-18: 2 mg
  Filled 2019-06-18: qty 4

## 2019-06-18 MED ORDER — VALPROATE SODIUM 500 MG/5ML IV SOLN
500.0000 mg | Freq: Three times a day (TID) | INTRAVENOUS | Status: DC
  Administered 2019-06-19 – 2019-06-25 (×21): 500 mg via INTRAVENOUS
  Filled 2019-06-18 (×24): qty 5

## 2019-06-18 MED ORDER — VALPROATE SODIUM 500 MG/5ML IV SOLN
500.0000 mg | Freq: Two times a day (BID) | INTRAVENOUS | Status: DC
  Administered 2019-06-18 (×2): 500 mg via INTRAVENOUS
  Filled 2019-06-18 (×3): qty 5

## 2019-06-18 MED ORDER — MIDAZOLAM HCL 2 MG/2ML IJ SOLN
INTRAMUSCULAR | Status: AC
  Filled 2019-06-18: qty 2

## 2019-06-18 MED ORDER — MIDAZOLAM HCL (PF) 5 MG/ML IJ SOLN: 4.0000 mg | Freq: Once | INTRAMUSCULAR | Status: DC

## 2019-06-18 MED ORDER — LACTATED RINGERS IV BOLUS
1000.0000 mL | Freq: Once | INTRAVENOUS | Status: AC
  Administered 2019-06-18: 10:00:00 1000 mL via INTRAVENOUS

## 2019-06-18 MED ORDER — CYPROHEPTADINE HCL 4 MG PO TABS
4.0000 mg | ORAL_TABLET | Freq: Three times a day (TID) | ORAL | Status: DC
  Administered 2019-06-18 – 2019-07-04 (×25): 4 mg
  Filled 2019-06-18 (×54): qty 1

## 2019-06-18 MED ORDER — BACLOFEN 10 MG PO TABS
10.0000 mg | ORAL_TABLET | Freq: Three times a day (TID) | ORAL | Status: DC
  Administered 2019-06-18 – 2019-07-09 (×38): 10 mg
  Filled 2019-06-18 (×66): qty 1

## 2019-06-18 NOTE — Progress Notes (Signed)
Pt's ETT was advanced 2 cm per Dr. Domingo Dimes verbal order. The ETT is now at 27 cm at the lip.

## 2019-06-18 NOTE — Progress Notes (Signed)
[facility] [place] NAME: Thomas [name]    MR#:  [id number]  DATE OF BIRTH:  1965/09/24  SUBJECTIVE:  CHIEF COMPLAINT:   Chief Complaint  Patient presents with  . Fall   -Patient is intubated and sedated.  Admitted for fall and right calcaneal fracture.  Going through alcohol withdrawals.  On Versed, Precedex and Dilaudid drips -Very high amount of sedation needed  REVIEW OF SYSTEMS:  Review of Systems  Unable to perform ROS: Critical illness    DRUG ALLERGIES:   Allergies  Allergen Reactions  . Haldol [Haloperidol Lactate]     VITALS:  Blood pressure (!) 68/52, pulse 60, temperature 97.8 F (36.6 C), temperature source Oral, resp. rate 16, height 6' 0.01" (1.829 m), weight 76.6 kg, SpO2 100 %.  PHYSICAL EXAMINATION:  Physical Exam  GENERAL:  53 y.o.-year-old patient lying in the bed with no acute distress.  EYES: Pupils equal, round, reactive to light and accommodation. No scleral icterus. Extraocular muscles intact.  HEENT: Head atraumatic, normocephalic. Oropharynx and nasopharynx clear.  NECK:  Supple, no jugular venous distention. No thyroid enlargement, no tenderness.  LUNGS: Normal breath sounds bilaterally, no wheezing, rales,rhonchi or crepitation. No use of accessory muscles of respiration.  Decreased bibasilar breath sounds CARDIOVASCULAR: S1, S2 normal. No murmurs, rubs, or gallops.  ABDOMEN: Soft, nontender, nondistended. Bowel sounds present. No organomegaly or mass.  EXTREMITIES: No  cyanosis, or clubbing.  Right leg and ankle in a cast.  Cool to touch toes, capillary refill is noted. NEUROLOGIC: Unable to assess neurologically as patient is sedated PSYCHIATRIC: The patient is sedated SKIN: No obvious rash, lesion, or ulcer.    LABORATORY PANEL:   CBC Recent Labs  Lab 06/18/19 0335  WBC 9.5  HGB 11.3*  HCT 34.6*  PLT 254    ------------------------------------------------------------------------------------------------------------------  Chemistries  Recent Labs  Lab 06/18/19 0335  NA 140  K 3.9  CL 103  CO2 29  GLUCOSE 154*  BUN 24*  CREATININE 0.58*  CALCIUM 8.8*  MG 2.0  AST 49*  ALT 52*  ALKPHOS 86  BILITOT 0.5   ------------------------------------------------------------------------------------------------------------------  Cardiac Enzymes No results for input(s): TROPONINI in the last 168 hours. ------------------------------------------------------------------------------------------------------------------  RADIOLOGY:  Dg Chest Port 1 View  Result Date: 06/18/2019 CLINICAL DATA:  Respiratory failure EXAM: PORTABLE CHEST 1 VIEW COMPARISON:  Chest radiograph 06/15/2019 FINDINGS: ET tube mid trachea. Right upper extremity PICC line tip projects over the superior vena cava. Monitoring leads overlie the patient. Enteric tube courses inferior to the diaphragm. Stable cardiac and mediastinal contours. Similar patchy consolidation right mid lower lung. Minimal left basilar atelectasis. No pleural effusion. Thoracic spine degenerative changes. IMPRESSION: Similar-appearing right mid and lower lung patchy consolidation which may represent pneumonia in the appropriate clinical setting. Electronically Signed   By: Lovey Newcomer M.D.   On: 06/18/2019 09:58    EKG:   Orders placed or performed during the hospital encounter of 06/07/19  . EKG 12-Lead  . EKG 12-Lead    ASSESSMENT AND PLAN:   JamesRhodesis a89 y.o.malewith a known history of polysubstance abuse including alcohol,schizoaffective bipolar type,seizures,hypertension and insomnia who initially presented on 06/07/2019 following an episode of a fall. Was evaluated in the emergency room and found to have right ankle fracture as well as right foot ischemia.Went into alcohol withdrawal and currently sedated on the vent  1.Acute  hypoxic respiratory failure with acute encephalopathy with agitation  -Patient went into alcohol withdrawal requiring several sedatives  and subsequently intubated for hypoxic respiratory failure and airway protection -Remains sedated on the vent. -On minimal vent settings. -Being managed by ICU team--continue to wean as tolerated -During last hospitalization, he was hospitalized for 2 months for alcohol withdrawals. -Might need a tracheostomy at this point  2.Right lower extremity ischemia -status post revascularization with angioplasty and stent placement by vascular surgery in right superficial femoral artery, right external iliac artery. -Continue aspirin and Plavix  3.Right ankle fracture. Status post fall Patient status post open reduction with internal fixation of right ankle by orthopedic physician. Day 10 postop -Patient will need physical therapy once extubated -On Dilaudid drip for pain  4.History of schizoaffective bipolar type -Currently sedated on the vent. -Being followed by psychiatry service and managing psych meds.  Patient on Seroquel, Versed drip, Dilaudid drip, Precedex drip, recommended to taper as tolerated  5.Alcohol abuse now with delirium tremens [Patient requiring sedation on the vent.  He is on Precedex drip and low-dose Versed drip [Continue thiamine,multivitamin and folic acid. - on a tapering dose of doxepin  6.   aspiration pneumonia-finished Unasyn.  7. DVT prophylaxis;Lovenox  Difficult to be weaned off vent due to significant withdrawals and behavior    All the records are reviewed and case discussed with Care Management/Social Workerr. Management plans discussed with the patient, family and they are in agreement.  CODE STATUS: Full code  TOTAL TIME TAKING CARE OF THIS PATIENT: 33 minutes.   POSSIBLE D/C IN ? DAYS, DEPENDING ON CLINICAL CONDITION.   Thomas [name] M.D on 06/18/2019 at 10:42 AM  Between 7am to 6pm - Pager  - [phone]  After 6pm go to [email or web address] - password [email or web address]  [facility]  Office  [phone]  CC: Primary care physician; Patient, [name]

## 2019-06-18 NOTE — Consult Note (Signed)
  Patient seen.  Case discussed with treatment team.  Patient remains intubated and sedated.  Patient attempts were made to wean patient off sedation.  Some improvement in patient conditions at this time.  Patient still receiving psychiatric medications at this time.  Psychiatry will continue to follow.

## 2019-06-18 NOTE — Progress Notes (Signed)
[facility] Daily Progress Note   Subjective: 10 Days Post-Op: 1. Introduction catheter intorightlower extremity 3rd order catheter placement  2.Contrast injectionrightlower extremity for distal runoff  3. Percutaneous transluminal angioplasty and stent placementrightsuperficial femoral artery  4. Percutaneous transluminal angioplasty and stent placement right external iliac artery 5.Star close closureleftcommon femoral arteriotomy  Still remains intubated and sedated for severe agitation due to DT's.  Objective: Vitals:   06/18/19 0300 06/18/19 0400 06/18/19 0700 06/18/19 0800  BP: 127/87 (!) 155/94 (!) 75/51   Pulse: 93 (!) 106 63   Resp:      Temp:  98.1 F (36.7 C)  97.8 F (36.6 C)  TempSrc:  Axillary  Oral  SpO2: 92% 97% 100%   Weight:      Height:        Intake/Output Summary (Last 24 hours) at 06/18/2019 0956 Last data filed at 06/18/2019 0932 Gross per 24 hour  Intake 3023.56 ml  Output 3560 ml  Net -536.44 ml   Physical Exam: Intubated and sedated. NAD CV: RRR Pulmonary: CTA Bilaterally Abdomen: Soft, Nontender, Nondistended Left Groin: Access site - clean, dry and intact Vascular: Right Lower Extremity: Thigh soft.Unable to access pedal pulses due to cast however toes are warm, pink with a good capillary refill.   Laboratory: CBC    Component Value Date/Time   WBC 9.5 06/18/2019 0335   HGB 11.3 (L) 06/18/2019 0335   HCT 34.6 (L) 06/18/2019 0335   PLT 254 06/18/2019 0335   BMET    Component Value Date/Time   NA 140 06/18/2019 0335   K 3.9 06/18/2019 0335   CL 103 06/18/2019 0335   CO2 29 06/18/2019 0335   GLUCOSE 154 (H) 06/18/2019 0335   BUN 24 (H) 06/18/2019 0335   CREATININE 0.58 (L) 06/18/2019 0335   CALCIUM 8.8 (L) 06/18/2019 0335   GFRNONAA >60 06/18/2019 0335   GFRAA >60 06/18/2019 0335   Assessment/Planning: The patient is  a 53 year old male who presented with right ankle fracture and right lower extremity ischemias/plower extremity revascularization and open reduction internal fixation ankle fracture with severe alcohol withdrawal now in DTs requiring sedation and subsequent intubation 1) Appreciate assistance in management from ICU,psychiatryand medicine 2) Successful right lower extremity revascularization - no further recommendation from vascular at this time. Will continue to monitor  Discussed with Dr. Eber Hong [facility] PA-C 06/18/2019 9:56 AM

## 2019-06-18 NOTE — Plan of Care (Signed)
  Problem: Education: Goal: Knowledge of General Education information will improve Description: Including pain rating scale, medication(s)/side effects and non-pharmacologic comfort measures Outcome: Not Progressing   Problem: Health Behavior/Discharge Planning: Goal: Ability to manage health-related needs will improve Outcome: Not Progressing   Problem: Clinical Measurements: Goal: Ability to maintain clinical measurements within normal limits will improve Outcome: Not Progressing Goal: Will remain free from infection Outcome: Not Progressing Goal: Diagnostic test results will improve Outcome: Not Progressing Goal: Cardiovascular complication will be avoided Outcome: Not Progressing   Problem: Activity: Goal: Risk for activity intolerance will decrease Outcome: Not Progressing   Problem: Nutrition: Goal: Adequate nutrition will be maintained Outcome: Not Progressing   Problem: Coping: Goal: Level of anxiety will decrease Outcome: Not Progressing   Problem: Elimination: Goal: Will not experience complications related to bowel motility Outcome: Not Progressing Goal: Will not experience complications related to urinary retention Outcome: Not Progressing   Problem: Pain Managment: Goal: General experience of comfort will improve Outcome: Not Progressing   Problem: Safety: Goal: Ability to remain free from injury will improve Outcome: Not Progressing   Problem: Skin Integrity: Goal: Risk for impaired skin integrity will decrease Outcome: Not Progressing   

## 2019-06-18 NOTE — Progress Notes (Signed)
Shift summary:  - Patient hypotensive this AM. 1L LR administered. BP did not respond. Levophed initiated.

## 2019-06-18 NOTE — Progress Notes (Signed)
Pt bedpad soiled and changed.  Pt became very agitated and combative.  Pt bilateral wrist restrains secured.  Pt has receive multiple bolus of dilaudid and versed - See MAR.   Pt continues to kick the bed.

## 2019-06-18 NOTE — Consult Note (Signed)
PHARMACY CONSULT NOTE - FOLLOW UP  Pharmacy Consult for Electrolyte Monitoring and Replacement   53 y/o M admitted with right lower extremity ischemia and right ankle fracture now s/p peripheral revascularization and ORIF. Patient has a history significant for schizophrenia and substance abuse. Post-operatively he became extremely agitated likely secondary to drug/alcohol withdrawal requiring high doses of sedatives. He is currently intubated and sedated in the ICU.   Recent Labs: Potassium (mmol/L)  Date Value  06/18/2019 3.9   Magnesium (mg/dL)  Date Value  06/18/2019 2.0   Calcium (mg/dL)  Date Value  06/18/2019 8.8 (L)   Albumin (g/dL)  Date Value  06/18/2019 2.8 (L)   Phosphorus (mg/dL)  Date Value  06/18/2019 2.8   Sodium (mmol/L)  Date Value  06/18/2019 140    Electrolytes: Patient has a history of alcohol abuse. Baseline nutritional status un-clear. On tube feeds. Goal of therapy to replete electrolytes to normal levels. Patient with good UOP. Regular bowel movements. BUN trending up. Sodium trending up. Phosphorous on lower end of normal today.    -No replacement warranted.   -Continue free water flushes 100 mL q4h   -BMP, Mg, Phos tomorrow AM  Constipation: Patient is having regular bowel movements. Constipating medications include hydromorphone, doxepin (tapering), cyproheptadine. Rectal tube was placed on 9/29.     -Continue bisacodyl suppository PRN  Glucose: No baseline A1c in chart and no history of diabetes recorded. Currently on tube feeds. Medications that could contribute to development of new-onset diabetes include quetiapine. Patient received methylprednisolone 60mg  IV Q6hr x 4 doses on 10/3-10/4. Blood glucoses elevated on AM BMP the last two days likely secondary to steroids.       -Continue to monitor blood glucoses.   Pharmacy will continue to monitor and adjust per consult.   Teresita Resident 06/18/2019 1:27 PM

## 2019-06-18 NOTE — Progress Notes (Addendum)
Follow up - Critical Care Medicine Note  Patient Details:    Thomas [name] is an 53 y.o. male with multiple comorbidities to include bipolar disorder and schizophrenia, seizure disorder, major depressive disorder with psychotic episodes and alcohol abuse presented with right ankle pain found to have a fracture and also found to have critical limb ischemia of the right lower extremity.  Patient had to have stents placed for his limb ischemia, post operatively he was too combative and require intubation and sedation for management.  He remains on the ventilator, remains critically ill.  Lines, Airways, Drains: Airway 7.5 mm (Active)  Secured at (cm) 25 cm 06/18/19 0820  Measured From Lips 06/18/19 0820  Secured Location Right 06/18/19 0820  Secured By [employer] 06/18/19 0820  Tube Holder Repositioned Yes 06/18/19 0820  Cuff Pressure (cm H2O) 30 cm H2O 06/18/19 0820  Site Condition Dry 06/18/19 0820     PICC Double Lumen 06/12/19 PICC Right Brachial 44 cm 2 cm (Active)  Indication for Insertion or Continuance of Line Limited venous access - need for IV therapy >5 days (PICC only);Poor Vasculature-patient has had multiple peripheral attempts or PIVs lasting less than 24 hours 06/18/19 0400  Exposed Catheter (cm) 2 cm 06/12/19 1530  Site Assessment Clean;Dry;Intact 06/18/19 0400  Lumen #1 Status Infusing;Flushed 06/18/19 0400  Lumen #2 Status Infusing;Flushed 06/18/19 0400  Dressing Type Transparent;Occlusive 06/18/19 0400  Dressing Status Clean;Intact;Antimicrobial disc in place;Dry 06/18/19 0400  Line Care Lumen 1 tubing changed;Lumen 2 tubing changed;Connections checked and tightened 06/16/19 1000  Dressing Intervention Other (Comment) 06/14/19 0800  Dressing Change Due 06/19/19 06/17/19 1000     NG/OG Tube Orogastric 18 Fr. Center mouth Xray Documented cm marking at nare/ corner of mouth 60 cm (Active)  Cm Marking at Nare/Corner of Mouth (if applicable) 60 cm 06/18/19  0400  External Length of Tube (cm) - (if applicable) 60 cm 06/15/19 1957  Site Assessment Clean;Intact;Dry 06/18/19 0400  Ongoing Placement Verification No acute changes, not attributed to clinical condition;No change in respiratory status;No change in cm markings or external length of tube from initial placement 06/18/19 0400  Status Infusing tube feed 06/18/19 0400  Drainage Appearance None 06/17/19 1608  Intake (mL) 0 mL 06/17/19 2000  Output (mL) 0 mL 06/18/19 0400     Rectal Tube/Pouch (Active)  Output (mL) 100 mL 06/18/19 0400  Intake (mL) 30 mL 06/17/19 2000     Urethral Catheter Brent [facility], RN (Active)  Indication for Insertion or Continuance of Catheter Unstable critically ill patients first 24-48 hours (See Criteria);Therapy based on hourly urine output monitoring and documentation for critical condition (NOT STRICT I&O) 06/18/19 0400  Site Assessment Intact 06/18/19 0400  Catheter Maintenance Seal intact;No dependent loops;Insertion date on drainage bag;Drainage bag/tubing not touching floor;Bag below level of bladder;Catheter secured 06/18/19 0400  Collection Container Standard drainage bag 06/18/19 0400  Securement Method Leg strap 06/18/19 0400  Urinary Catheter Interventions (if applicable) Unclamped 06/18/19 0400  Input (mL) 0 mL 06/17/19 2000  Output (mL) 200 mL 06/18/19 0435    Anti-infectives:  Anti-infectives (From admission, onward)   Start     Dose/Rate Route Frequency Ordered Stop   06/16/19 1000  levofloxacin (LEVAQUIN) IVPB 750 mg     750 mg 100 mL/hr over 90 Minutes Intravenous Every 24 hours 06/16/19 0906 06/16/19 1955   06/13/19 1600  Ampicillin-Sulbactam (UNASYN) 3 g in sodium chloride 0.9 % 100 mL IVPB  Status:  Discontinued     3 g 200  mL/hr over 30 Minutes Intravenous Every 6 hours 06/13/19 1444 06/16/19 0906   06/08/19 1800  ceFAZolin (ANCEF) IVPB 2g/100 mL premix     2 g 200 mL/hr over 30 Minutes Intravenous Every 8 hours 06/08/19 1730 06/09/19  1900   06/08/19 1800  clindamycin (CLEOCIN) IVPB 600 mg     600 mg 100 mL/hr over 30 Minutes Intravenous Every 8 hours 06/08/19 1730 06/09/19 2159   06/08/19 0000  ceFAZolin (ANCEF) IVPB 1 g/50 mL premix  Status:  Discontinued    Note to Pharmacy: Send with pt to OR   1 g 100 mL/hr over 30 Minutes Intravenous On call 06/07/19 2030 06/07/19 2251      Microbiology: Results for orders placed or performed during the hospital encounter of 06/07/19  SARS Coronavirus 2 [facility] order, Performed in [facility] hospital lab) Nasopharyngeal Nasopharyngeal Swab     Status: None   Collection Time: 06/07/19  6:34 PM   Specimen: Nasopharyngeal Swab  Result Value Ref Range Status   SARS Coronavirus 2 NEGATIVE NEGATIVE Final    Comment: (NOTE) If result is NEGATIVE SARS-CoV-2 target nucleic acids are NOT DETECTED. The SARS-CoV-2 RNA is generally detectable in upper and lower  respiratory specimens during the acute phase of infection. The lowest  concentration of SARS-CoV-2 viral copies this assay can detect is 250  copies / mL. A negative result does not preclude SARS-CoV-2 infection  and should not be used as the sole basis for treatment or other  patient management decisions.  A negative result may occur with  improper specimen collection / handling, submission of specimen other  than nasopharyngeal swab, presence of viral mutation(s) within the  areas targeted by this assay, and inadequate number of viral copies  (<250 copies / mL). A negative result must be combined with clinical  observations, patient history, and epidemiological information. If result is POSITIVE SARS-CoV-2 target nucleic acids are DETECTED. The SARS-CoV-2 RNA is generally detectable in upper and lower  respiratory specimens dur ing the acute phase of infection.  Positive  results are indicative of active infection with SARS-CoV-2.  Clinical  correlation with patient history and other diagnostic information is    necessary to determine patient infection status.  Positive results do  not rule out bacterial infection or co-infection with other viruses. If result is PRESUMPTIVE POSTIVE SARS-CoV-2 nucleic acids MAY BE PRESENT.   A presumptive positive result was obtained on the submitted specimen  and confirmed on repeat testing.  While 2019 novel coronavirus  (SARS-CoV-2) nucleic acids may be present in the submitted sample  additional confirmatory testing may be necessary for epidemiological  and / or clinical management purposes  to differentiate between  SARS-CoV-2 and other Sarbecovirus currently known to infect humans.  If clinically indicated additional testing with an alternate test  methodology [phone]) is advised. The SARS-CoV-2 RNA is generally  detectable in upper and lower respiratory sp ecimens during the acute  phase of infection. The expected result is Negative. Fact Sheet for Patients:  BoilerBrush.com.cy Fact Sheet for Healthcare Providers: https://pope.com/ This test is not yet approved or cleared by the [place] FDA and has been authorized for detection and/or diagnosis of SARS-CoV-2 by FDA under an Emergency Use Authorization (EUA).  This EUA will remain in effect (meaning this test can be used) for the duration of the COVID-19 declaration under Section 564(b)(1) of the Act, 21 U.S.C. section 360bbb-3(b)(1), unless the authorization is terminated or revoked sooner. Performed at [facility], [address] [place], [place] [address]   MRSA PCR Screening     Status: None   Collection Time: 06/08/19  5:53 AM   Specimen: Nasal Mucosa; Nasopharyngeal  Result Value Ref Range Status   MRSA by PCR NEGATIVE NEGATIVE Final    Comment:        The GeneXpert MRSA Assay (FDA approved for NASAL specimens only), is one component of a comprehensive MRSA colonization surveillance program. It is not intended to diagnose  MRSA infection nor to guide or monitor treatment for MRSA infections. Performed at [facility], [address] [place], [place] [address]   Culture, respiratory     Status: None   Collection Time: 06/13/19  3:28 PM   Specimen: Tracheal Aspirate  Result Value Ref Range Status   Specimen Description   Final    TRACHEAL ASPIRATE Performed at [facility], [address] [place], [place] [address]    Special Requests   Final    NONE Performed at [facility], [address] [place], [place] [address]    Gram Stain   Final    ABUNDANT WBC PRESENT, PREDOMINANTLY PMN ABUNDANT GRAM POSITIVE COCCI RARE GRAM NEGATIVE RODS Performed at [facility], [address] [place], [place] [address]    Culture   Final    ABUNDANT KLEBSIELLA OXYTOCA ABUNDANT STENOTROPHOMONAS MALTOPHILIA    Report Status 06/17/2019 FINAL  Final   Organism ID, Bacteria KLEBSIELLA OXYTOCA  Final   Organism ID, Bacteria STENOTROPHOMONAS MALTOPHILIA  Final      Susceptibility   Klebsiella oxytoca - MIC*    AMPICILLIN >=32 RESISTANT Resistant     CEFAZOLIN >=64 RESISTANT Resistant     CEFEPIME <=1 SENSITIVE Sensitive     CEFTAZIDIME <=1 SENSITIVE Sensitive     CEFTRIAXONE <=1 SENSITIVE Sensitive     CIPROFLOXACIN <=0.25 SENSITIVE Sensitive     GENTAMICIN <=1 SENSITIVE Sensitive     IMIPENEM <=0.25 SENSITIVE Sensitive     TRIMETH/SULFA <=20 SENSITIVE Sensitive     AMPICILLIN/SULBACTAM 4 SENSITIVE Sensitive     PIP/TAZO <=4 SENSITIVE Sensitive     Extended ESBL NEGATIVE Sensitive     * ABUNDANT KLEBSIELLA OXYTOCA   Stenotrophomonas maltophilia - MIC*    LEVOFLOXACIN 0.25 SENSITIVE Sensitive     TRIMETH/SULFA <=20 SENSITIVE Sensitive     * ABUNDANT STENOTROPHOMONAS MALTOPHILIA    Best Practice/Protocols:  VTE Prophylaxis: Lovenox (prophylaxtic dose) GI Prophylaxis: Antihistamine Continous Sedation  Events: 9/24- Presented to ED with displaced right ankle fracture  and limb ischemia 9/24- Vascular surgery to revascularize right lower extremity 9/25- Open reduction internal fixation of right ankle fracture medially and laterally 9/26- patient on precedex and multiple PRN IV meds still tachycardic with episodes of aggitation and combative/aggresive behavior. Concern for loss of airway protection 9/27- patient attempting to physically attack staff and loudly verbally abusive, he is in high doses of IV sedation. After additional PRN IV medications patient temporarilly sedated with episodic apnea. Pt sedated and intubated. 9/28 failed SAT due to severe agitation and delirium 9/29PICC line placed 9/30 failed SAT again for severe agitation and delirium 9/30 CXR suspicious for right lower lobe pneumonia, began treatment with Unasyn 10/2 Pt failed spontaneous breathing trial with sedation 10/3 remains on the ventilator family and do so informed ICU MD with regards to seizure and psychiatric issues recently. 10/4 updated mother at bedside.  Instituted changes to sedatives.  Studies: Dg Ankle 2 Views Right  Result Date: 06/07/2019 CLINICAL DATA:  Medial and lateral malleolar fracture subluxation status post reduction. EXAM: RIGHT ANKLE - 2 VIEW COMPARISON:  Right ankle x-rays from same day. FINDINGS: Improved alignment of the medial malleolar and distal fibular fractures status post reduction. Residual 2 mm posterior displacement of the distal fibular fracture with 1.5 cm overriding. Slight residual widening of the medial clear space. Joint spaces are preserved. Bone mineralization is normal. Unchanged diffuse soft tissue swelling. IMPRESSION: Improved alignment status post reduction of the medial and lateral malleolar fractures. Electronically Signed   By: Obie Dredge M.D.   On: 06/07/2019 20:18   Dg Ankle Complete Right  Result Date: 06/08/2019 CLINICAL DATA:  Status post surgery to right ankle. EXAM: RIGHT ANKLE - COMPLETE 3+ VIEW COMPARISON:  None.  FINDINGS: A plate has been placed across the distal fibular fracture. Two screws been placed through the medial malleolus. Skin staples are identified. IMPRESSION: Repair of right ankle fractures as above. Electronically Signed   By: Gerome Sam III M.D   On: 06/08/2019 17:37   Dg Ankle Complete Right  Result Date: 06/07/2019 CLINICAL DATA:  Per EMS report, patient slipped and fell in his kitchen and was witnessed by his wife. Patient has deformity to right ankle. Patient was given of Fentanyl en route by EMT. Patient denies relief of the pain. Patient arrives with right lower leg splinted. EXAM: RIGHT ANKLE - COMPLETE 3+ VIEW COMPARISON:  None. FINDINGS: There is a transverse fracture across the base of the medial malleolus and an oblique fracture of the distal fibula extending from the metadiaphysis to the distal metaphysis at the level of the ankle joint. The talus is subluxed posteriorly and laterally by approximately 5 mm. The distal fibular fracture is displaced posteriorly by 1.3 cm. There is surrounding soft tissue swelling. IMPRESSION: 1. Displaced fractures of the medial malleolus and distal fibula with subluxation of the talus as detailed. No dislocation. Electronically Signed   By: Amie Portland M.D.   On: 06/07/2019 18:41   Dg Abd 1 View  Result Date: 06/11/2019 CLINICAL DATA:  Oral gastric tube placement EXAM: ABDOMEN - 1 VIEW COMPARISON:  06/10/2019 FINDINGS: Lung bases are clear. Esophageal tube tip overlies the proximal stomach, side-port at the cardia of the stomach. Large stool in the upper abdomen. Coils to the right of low L2. IMPRESSION: Esophageal tube tip overlies the proximal stomach. Electronically Signed   By: Jasmine Pang M.D.   On: 06/11/2019 23:27   Dg Abd 1 View  Result Date: 06/10/2019 CLINICAL DATA:  Evaluate NG tube EXAM: ABDOMEN - 1 VIEW COMPARISON:  None. FINDINGS: The NG tube is been reposition with the side port and distal tip now located in the stomach.  IMPRESSION: The NG tube terminates in the stomach. Electronically Signed   By: Gerome Sam III M.D   On: 06/10/2019 13:39   Dg Abd 1 View  Result Date: 06/10/2019 CLINICAL DATA:  Evaluate NG tube placement EXAM: ABDOMEN - 1 VIEW COMPARISON:  None. FINDINGS: The NG tube is located in the right lower lobe of the lung. A subsequent images already been taken after repositioning. IMPRESSION: The NG tube terminates in the right lower lobe of the lung. A subsequent images has already been obtained after repositioning. Electronically Signed   By: Gerome Sam III M.D   On: 06/10/2019 13:39   Dg Chest Port 1 View  Result Date: 06/15/2019 CLINICAL DATA:  Acute respiratory failure and hypoxia. EXAM: PORTABLE CHEST 1 VIEW COMPARISON:  Chest radiograph dated 06/13/2019.  FINDINGS: An endotracheal tube terminates in the midthoracic trachea. An enteric tube enters the stomach and terminates below the field of view. A right upper extremity central venous catheter tip overlies the superior cavoatrial junction. Cervical spinal fusion hardware is seen. Bilateral interstitial and airspace opacities are not significantly changed since prior exam and are most significant in the right lower lung. There is no pleural effusion or pneumothorax. The cardiomediastinal silhouette is unchanged. IMPRESSION: Unchanged bilateral interstitial and airspace opacities which are most significant in the right lower lung. Electronically Signed   By: Romona Curlsyler  Litton M.D.   On: 06/15/2019 08:39   Dg Chest Port 1 View  Result Date: 06/13/2019 CLINICAL DATA:  Hypoxia, smoker, hypertension EXAM: PORTABLE CHEST 1 VIEW COMPARISON:  Portable exam 1021 hours compared to 06/12/2019 FINDINGS: Tip of endotracheal tube projects 4.0 cm above carina. Nasogastric tube extends into stomach. RIGHT arm PICC line tip projects over superior RIGHT atrium. Normal heart size, mediastinal contours, and pulmonary vascularity. Developing opacity at RIGHT lower lobe  question atelectasis versus infiltrate. Slight accentuation of interstitial markings bilaterally. No pleural effusion or pneumothorax. No acute osseous findings. IMPRESSION: Stable support lines/tubes. Developing atelectasis versus infiltrate at RIGHT base. Electronically Signed   By: Ulyses SouthwardMark  Boles M.D.   On: 06/13/2019 10:38   Dg Chest Port 1 View  Result Date: 06/12/2019 CLINICAL DATA:  PICC line placement EXAM: PORTABLE CHEST 1 VIEW COMPARISON:  One day prior FINDINGS: Cervical spine fixation. Endotracheal tube terminates 5.5 cm above carina. Nasogastric tube extends beyond the inferior aspect of the film. Right PICC line tip at low SVC or superior caval/atrial junction. Numerous leads and wires project over the chest. Normal heart size. No pleural effusion or pneumothorax. No lobar consolidation. IMPRESSION: Appropriate position of right-sided PICC line, without pneumothorax. Electronically Signed   By: Jeronimo GreavesKyle  Talbot M.D.   On: 06/12/2019 16:11   Dg Chest Port 1 View  Result Date: 06/11/2019 CLINICAL DATA:  Acute respiratory failure. EXAM: PORTABLE CHEST 1 VIEW COMPARISON:  06/10/2019. FINDINGS: Endotracheal tube and stable position. NG tube tip has now been repositioned and is now below left hemidiaphragm. Heart size normal. Low lung volumes. Mild left base atelectasis/infiltrate. No pleural effusion or pneumothorax. IMPRESSION: 1. NG tube tip is now been repositioned and is now below left hemidiaphragm. Endotracheal tube in stable position. 2.  Low lung volumes.  Mild left base atelectasis/infiltrate. Electronically Signed   By: Maisie Fushomas  Register   On: 06/11/2019 06:54   Dg Chest Port 1 View  Result Date: 06/10/2019 CLINICAL DATA:  Endotracheal tube placement. EXAM: PORTABLE CHEST 1 VIEW COMPARISON:  None. FINDINGS: The heart size and mediastinal contours are within normal limits. Endotracheal tube is in grossly good position. Nasogastric tube is seen in right lower lobe bronchus. No pneumothorax or  pleural effusion is noted. Both lungs are clear. The visualized skeletal structures are unremarkable. IMPRESSION: Endotracheal tube in grossly good position. Nasogastric tube seen in right lower lobe bronchus; I spoke with the patient's nurse, Ladona Ridgelaylor, who stated that they are aware of this finding. No other abnormality seen in the chest. Electronically Signed   By: Lupita RaiderJames  Green Jr M.D.   On: 06/10/2019 13:40   [place] Ekg Site Rite  Result Date: 06/12/2019 If Site Rite image not attached, placement could not be confirmed due to current cardiac rhythm.   Consults: Treatment Team:  Deeann SaintMiller, Howard, MD Cherly BeachNorman, Jacqueline J, DO [facility], [facility] [place], MD Jama Flavorsjie, Jude, MD Schnier, Latina CraverGregory G, MD   Subjective:  Overnight Issues: Overnight no new issues.  He was hypotensive this morning uncertain cause probably secondary to increased need for sedatives.  Objective:  Vital signs for last 24 hours: Temp:  [97.8 F (36.6 C)-98.5 F (36.9 C)] 97.8 F (36.6 C) (10/05 0800) Pulse Rate:  [61-154] 63 (10/05 0700) Resp:  [16] 16 (10/04 2100) BP: (75-207)/(51-131) 75/51 (10/05 0700) SpO2:  [86 %-100 %] 100 % (10/05 0700) FiO2 (%):  [28 %-35 %] 30 % (10/05 0820)  Hemodynamic parameters for last 24 hours:    Intake/Output from previous day: 10/04 0701 - 10/05 0700 In: 3237.5 [I.V.:1187.5; NG/GT:1780] Out: 3710 [Urine:3110; Stool:600]  Intake/Output this shift: No intake/output data recorded.  Vent settings for last 24 hours: Vent Mode: PRVC FiO2 (%):  [28 %-35 %] 30 % Set Rate:  [16 bmp] 16 bmp Vt Set:  [500 mL] 500 mL PEEP:  [5 cmH20] 5 cmH20 Plateau Pressure:  [14 cmH20-16 cmH20] 16 cmH20  Physical Exam:  GENERAL:critically ill appearing, intubated and sedated, bilateral temporal wasting HEAD: Normocephalic, atraumatic.  EYES: Pupils equal, round, reactive to light.  No scleral icterus.  MOUTH: Orotracheally intubated.  Oral mucosa moist.  G in place. NECK: Supple. No JVD.  No  crepitus. PULMONARY: Coarse breath sounds bilaterally.  No wheezes or rhonchi noted. CARDIOVASCULAR: S1 and S2. Regular rate and rhythm. No murmurs, rubs, or gallops.  GASTROINTESTINAL: Soft, nontender, non-distended.Positive bowel sounds.  MUSCULOSKELETAL: No swelling, clubbing, or edema.  NEUROLOGIC: Heavily sedated, RASS -4, agitated when sedation lightened. SKIN:intact,warm,dry  Chest x-ray today: Assisted right lower lobe opacity however some increased variation from prior.  High ET tube (already advanced).    Assessment/Plan:   1.  Acute hypoxic respiratory failure: Initial requirement for intubation was extreme agitation and need for increasing sedatives.  Patient has underlying mental illness.  Course complicated by right lower lobe aspiration pneumonia Klebsiella/Stenotrophomonas.  Continue supportive care.  Currently not amenable to weaning or SAT due to increased agitation when sedation is lightened.  Continue bronchodilators and pulmonary toilet.  SATs and SBT's as tolerated.  2.  Underlying history of schizophrenia, bipolar disorder and major depressive disorder with frequent psychotic episodes: This issue adds complexity to his management as is affecting his requirements for sedatives.  We will add Depacon for mood stabilization.  Low-dose Periactin for potential serotonergic surge.  Wean off IV sedatives as tolerated.  Continue nighttime Seroquel.  Per psychiatry Sinequan is being weaned off.  3.  Alcohol abuse with delirium tremens: This issue adds complexity to his management.  Increased thiamine to 500 mg dose x3 days in the event he may be entering a Warnicke's encephalopathy phase.  Continue CIWA protocol  4.  Encephalopathy: Multifactorial issues with prior psychotic episodes due to #2 above and issues with delirium tremens due to 3 above.  In addition patient is critically ill and may be having also issues with ICU delirium.  Continue IV sedatives with Precedex and Dilaudid,  transition off these IV infusions as tolerated.  Continue Seroquel at at bedtime, thiamine as above.  Note is made that patient does have a history of seizure disorder will check magnesium level and replete if needed, Depacon has been added not as an anti-convulsant but has mood stabilizer.  5.  Klebsiella/Stenotrophomonas pneumonia: Continue Levaquin.  6.  Nutrition: Continue tube feeds.  7.  ICU Best practice: Patient is on GI and and DVT prophylaxis as above.  Constipation protocol as indicated, hyperglycemia protocol as indicated.  8.  Transient hypotension: Patient has  responded to IV fluid boluses but has required low-dose norepinephrine (2 mcg/min).  Likely etiology is heavy doses of sedatives required.   Overall, patient is critically ill, prognosis is guarded. High risk for cardiac arrest and death.  Discussed with patient's mother at bedside, she was fully apprised.    LOS: 11 days   Additional comments: Multidisciplinary rounds were performed with critical care team.  Critical Care Total Time*: 45 Minutes  C. Danice Goltz, MD [facility] 06/18/2019  *Care during the described time interval was provided by me and/or other providers on the critical care team.  I have reviewed this patient's available data, including medical history, events of note, physical examination and test results as part of my evaluation.

## 2019-06-18 NOTE — Progress Notes (Addendum)
Pt has been in NSR, SB, and ST throughout shift. Pts lungs have been diminished rhonchi throughout. Has a foley with good urine output, with some blood clots noted (flushed foley). Pt had became hypotensive this am, Dr. Patsey Berthold was notified adm a bolus of LR per order. BP did not respond so order a Levo Drip and was started and is currently running. Tried weaning pt off sedation drips slowly, pt became agitated and restless during bath. Resumed sedation and previous rate and Pt is resting comfortably.

## 2019-06-18 NOTE — Progress Notes (Signed)
Without stimulation from staff - patient attempted to climb OOB.  Unable to redirect.  Pulled him up in bed.  Increased versed gtt.

## 2019-06-19 LAB — BASIC METABOLIC PANEL
Anion gap: 7 (ref 5–15)
BUN: 18 mg/dL (ref 6–20)
CO2: 30 mmol/L (ref 22–32)
Calcium: 8.3 mg/dL — ABNORMAL LOW (ref 8.9–10.3)
Chloride: 103 mmol/L (ref 98–111)
Creatinine, Ser: 0.63 mg/dL (ref 0.61–1.24)
GFR calc Af Amer: >60 mL/min (ref >60)
GFR calc non Af Amer: >60 mL/min (ref >60)
Glucose, Bld: 108 mg/dL — ABNORMAL HIGH (ref 70–99)
Potassium: 3.7 mmol/L (ref 3.5–5.1)
Sodium: 140 mmol/L (ref 135–145)

## 2019-06-19 LAB — CBC WITH DIFFERENTIAL/PLATELET
Abs Immature Granulocytes: 0.1 10*3/uL — ABNORMAL HIGH (ref 0.00–0.07)
Basophils Absolute: 0 10*3/uL (ref 0.0–0.1)
Basophils Relative: 0 %
Eosinophils Absolute: 0.1 10*3/uL (ref 0.0–0.5)
Eosinophils Relative: 1 %
HCT: 34.4 % — ABNORMAL LOW (ref 39.0–52.0)
Hemoglobin: 11.2 g/dL — ABNORMAL LOW (ref 13.0–17.0)
Immature Granulocytes: 1 %
Lymphocytes Relative: 18 %
Lymphs Abs: 1.5 10*3/uL (ref 0.7–4.0)
MCH: 33.5 pg (ref 26.0–34.0)
MCHC: 32.6 g/dL (ref 30.0–36.0)
MCV: 103 fL — ABNORMAL HIGH (ref 80.0–100.0)
Monocytes Absolute: 1.4 10*3/uL — ABNORMAL HIGH (ref 0.1–1.0)
Monocytes Relative: 16 %
Neutro Abs: 5.5 10*3/uL (ref 1.7–7.7)
Neutrophils Relative %: 64 %
Platelets: 287 10*3/uL (ref 150–400)
RBC: 3.34 MIL/uL — ABNORMAL LOW (ref 4.22–5.81)
RDW: 13.2 % (ref 11.5–15.5)
WBC: 8.6 10*3/uL (ref 4.0–10.5)
nRBC: 0 % (ref 0.0–0.2)

## 2019-06-19 LAB — C DIFFICILE QUICK SCREEN W PCR REFLEX
C Diff antigen: NEGATIVE
C Diff interpretation: NOT DETECTED
C Diff toxin: NEGATIVE

## 2019-06-19 LAB — PHOSPHORUS: Phosphorus: 3.9 mg/dL (ref 2.5–4.6)

## 2019-06-19 LAB — MAGNESIUM: Magnesium: 1.7 mg/dL (ref 1.7–2.4)

## 2019-06-19 MED ORDER — MAGNESIUM SULFATE 2 GM/50ML IV SOLN
2.0000 g | Freq: Once | INTRAVENOUS | Status: AC
  Administered 2019-06-19: 07:00:00 2 g via INTRAVENOUS
  Filled 2019-06-19: qty 50

## 2019-06-19 MED ORDER — METOPROLOL TARTRATE 5 MG/5ML IV SOLN
2.5000 mg | Freq: Four times a day (QID) | INTRAVENOUS | Status: DC | PRN
  Administered 2019-06-19 – 2019-06-24 (×6): 2.5 mg via INTRAVENOUS
  Filled 2019-06-19 (×7): qty 5

## 2019-06-19 MED ORDER — IBUPROFEN 100 MG/5ML PO SUSP
600.0000 mg | Freq: Once | ORAL | Status: AC
  Administered 2019-06-19: 22:00:00 600 mg
  Filled 2019-06-19 (×3): qty 30

## 2019-06-19 MED ORDER — SULFAMETHOXAZOLE-TRIMETHOPRIM 400-80 MG/5ML IV SOLN
320.0000 mg | Freq: Three times a day (TID) | INTRAVENOUS | Status: DC
  Administered 2019-06-19 – 2019-06-25 (×18): 320 mg via INTRAVENOUS
  Filled 2019-06-19 (×24): qty 20

## 2019-06-19 MED ORDER — CHLORHEXIDINE GLUCONATE 0.12 % MT SOLN
OROMUCOSAL | Status: AC
  Filled 2019-06-19: qty 15

## 2019-06-19 NOTE — Progress Notes (Addendum)
[facility] Daily Progress Note   Subjective: 11 Days Post-Op: 1. Introduction catheter intorightlower extremity 3rd order catheter placement  2.Contrast injectionrightlower extremity for distal runoff  3. Percutaneous transluminal angioplasty and stent placementrightsuperficial femoral artery  4. Percutaneous transluminal angioplasty and stent placement right external iliac artery 5.Star close closureleftcommon femoral arteriotomy  Patient intubated however more alert this AM.   Objective: Vitals:   06/19/19 0700 06/19/19 0800 06/19/19 0900 06/19/19 1000  BP: 126/75 118/76 96/63 123/71  Pulse: 83 82 79 (!) 147  Resp:      Temp:  (!) 100.7 F (38.2 C)    TempSrc:      SpO2: 96% 96% 96% 94%  Weight:      Height:        Intake/Output Summary (Last 24 hours) at 06/19/2019 1110 Last data filed at 06/19/2019 0800 Gross per 24 hour  Intake 2061.31 ml  Output 3565 ml  Net -1503.69 ml   Physical Exam: Intubated and sedated however slightly more alert this AM. NAD CV: RRR Pulmonary: CTA Bilaterally Abdomen: Soft, Nontender, Nondistended Left Groin: Access site - clean, dry and intact Vascular: Right Lower Extremity: Thigh soft.Unable to access pedal pulses due to cast however toes are warm, pink with a good capillary refill.               Laboratory: CBC    Component Value Date/Time   WBC 8.6 06/19/2019 0505   HGB 11.2 (L) 06/19/2019 0505   HCT 34.4 (L) 06/19/2019 0505   PLT 287 06/19/2019 0505   BMET    Component Value Date/Time   NA 140 06/19/2019 0505   K 3.7 06/19/2019 0505   CL 103 06/19/2019 0505   CO2 30 06/19/2019 0505   GLUCOSE 108 (H) 06/19/2019 0505   BUN 18 06/19/2019 0505   CREATININE 0.63 06/19/2019 0505   CALCIUM 8.3 (L) 06/19/2019 0505   GFRNONAA >60 06/19/2019 0505   GFRAA >60 06/19/2019 0505   Assessment/Planning: The patient is a 53  year old male who presented with right ankle fracture and right lower extremity ischemias/plower extremity revascularization and open reduction internal fixation ankle fracture with severe alcohol withdrawal now in DTs requiring sedation and subsequent intubation 1) Appreciate assistance in management from ICU,psychiatryand medicine 2) Successful right lower extremity revascularization - no further recommendation from vascular at this time. Will continue to monitor 3) On ASA / Plavix   Discussed with Dr. Eber Hong Willeen Novak PA-C 06/19/2019 11:10 AM

## 2019-06-19 NOTE — Progress Notes (Signed)
[place] [facility] [place] NAME: Thomas [name]    MR#:  [id number]  DATE OF BIRTH:  [birth year]  SUBJECTIVE:  CHIEF COMPLAINT:   Chief Complaint  Patient presents with  . Fall   -Remains intubated, sedated on Precedex, Versed and Dilaudid drips. -Calm this morning.  Requiring low-dose Levophed for hypotension last night.  REVIEW OF SYSTEMS:  Review of Systems  Unable to perform ROS: Critical illness    DRUG ALLERGIES:   Allergies  Allergen Reactions  . Haldol [Haloperidol Lactate]     VITALS:  Blood pressure 118/76, pulse 82, temperature (!) 100.7 F (38.2 C), resp. rate 16, height 6' 0.01" (1.829 m), weight 76.6 kg, SpO2 96 %.  PHYSICAL EXAMINATION:  Physical Exam  GENERAL:  53 y.o.-year-old patient lying in the bed with no acute distress.  EYES: Pupils equal, round, reactive to light and accommodation. No scleral icterus. Extraocular muscles intact.  HEENT: Head atraumatic, normocephalic. Oropharynx and nasopharynx clear.  NECK:  Supple, no jugular venous distention. No thyroid enlargement, no tenderness.  LUNGS: Normal breath sounds bilaterally, no wheezing, rales,rhonchi or crepitation. No use of accessory muscles of respiration.  Decreased bibasilar breath sounds CARDIOVASCULAR: S1, S2 normal. No murmurs, rubs, or gallops.  ABDOMEN: Soft, nontender, nondistended. Bowel sounds present. No organomegaly or mass.  EXTREMITIES: No  cyanosis, or clubbing.  Right leg and ankle in a cast.  Cool to touch toes, capillary refill is noted. NEUROLOGIC: Unable to assess neurologically as patient is sedated PSYCHIATRIC: The patient is sedated SKIN: No obvious rash, lesion, or ulcer.    LABORATORY PANEL:   CBC Recent Labs  Lab 06/19/19 0505  WBC 8.6  HGB 11.2*  HCT 34.4*  PLT 287   ------------------------------------------------------------------------------------------------------------------  Chemistries  Recent Labs  Lab  06/18/19 0335 06/19/19 0505  NA 140 140  K 3.9 3.7  CL 103 103  CO2 29 30  GLUCOSE 154* 108*  BUN 24* 18  CREATININE 0.58* 0.63  CALCIUM 8.8* 8.3*  MG 2.0 1.7  AST 49*  --   ALT 52*  --   ALKPHOS 86  --   BILITOT 0.5  --    ------------------------------------------------------------------------------------------------------------------  Cardiac Enzymes No results for input(s): TROPONINI in the last 168 hours. ------------------------------------------------------------------------------------------------------------------  RADIOLOGY:  Dg Chest Port 1 View  Result Date: 06/18/2019 CLINICAL DATA:  Respiratory failure EXAM: PORTABLE CHEST 1 VIEW COMPARISON:  Chest radiograph 06/15/2019 FINDINGS: ET tube mid trachea. Right upper extremity PICC line tip projects over the superior vena cava. Monitoring leads overlie the patient. Enteric tube courses inferior to the diaphragm. Stable cardiac and mediastinal contours. Similar patchy consolidation right mid lower lung. Minimal left basilar atelectasis. No pleural effusion. Thoracic spine degenerative changes. IMPRESSION: Similar-appearing right mid and lower lung patchy consolidation which may represent pneumonia in the appropriate clinical setting. Electronically Signed   By: Lovey Newcomer M.D.   On: 06/18/2019 09:58    EKG:   Orders placed or performed during the hospital encounter of 06/07/19  . EKG 12-Lead  . EKG 12-Lead    ASSESSMENT AND PLAN:   JamesRhodesis a46 y.o.malewith a known history of polysubstance abuse including alcohol,schizoaffective bipolar type,seizures,hypertension and insomnia who initially presented on 06/07/2019 following an episode of a fall. Was evaluated in the emergency room and found to have right ankle fracture as well as right foot ischemia.Went into alcohol withdrawal and currently sedated on the vent  1.Acute hypoxic respiratory failure with acute encephalopathy with agitation  -  Patient  went into alcohol withdrawal requiring several sedatives and subsequently intubated for hypoxic respiratory failure and airway protection on 06/10/2019 -Remains sedated on the vent. -On minimal vent settings. -Being managed by ICU team--continue to wean as tolerated -During last hospitalization, he was hospitalized for 2 months for alcohol withdrawals. -Might need a tracheostomy at this point  2.Right lower extremity ischemia -status post revascularization with angioplasty and stent placement by vascular surgery in right superficial femoral artery, right external iliac artery. -Continue aspirin and Plavix  3.Right ankle fracture. Status post fall Patient status post open reduction with internal fixation of right ankle by orthopedic physician. Day 11 postop -Patient will need physical therapy once extubated -On Dilaudid drip for pain  4.History of schizoaffective bipolar type -Currently sedated on the vent. -Being followed by psychiatry service and managing psych meds.  Patient on Seroquel, Versed drip, Dilaudid drip, Precedex drip, recommended to taper as tolerated -Psychiatry following -Seroquel added at bedtime.  Also on Depakote 3 times daily  5.Alcohol abuse now with delirium tremens [Patient requiring sedation on the vent.  He is on Precedex drip and low-dose Versed drip [Continue thiamine,multivitamin and folic acid. - on a tapering dose of doxepin  6.   aspiration pneumonia-finished Unasyn.  7. DVT prophylaxis;Lovenox  Difficult to be weaned off vent due to significant withdrawals and behavior    All the records are reviewed and case discussed with Care Management/Social Workerr. Management plans discussed with the patient, family and they are in agreement.  CODE STATUS: Full code  TOTAL TIME TAKING CARE OF THIS PATIENT: 34 minutes.   POSSIBLE D/C IN ? DAYS, DEPENDING ON CLINICAL CONDITION.   Thomas [name] M.D on 06/19/2019 at 10:15 AM  Between  7am to 6pm - Pager - [phone]  After 6pm go to [email or web address] - password [email or web address]  [facility]  Office  [phone]  CC: Primary care physician; Patient, [name]

## 2019-06-19 NOTE — Consult Note (Signed)
PHARMACY CONSULT NOTE - FOLLOW UP  Pharmacy Consult for Electrolyte Monitoring and Replacement   53 y/o M admitted with right lower extremity ischemia and right ankle fracture now s/p peripheral revascularization and ORIF. Patient has a history significant for schizophrenia and substance abuse. Post-operatively he became extremely agitated likely secondary to drug/alcohol withdrawal requiring high doses of sedatives. He is currently intubated and sedated in the ICU.   Recent Labs: Potassium (mmol/L)  Date Value  06/19/2019 3.7   Magnesium (mg/dL)  Date Value  06/19/2019 1.7   Calcium (mg/dL)  Date Value  06/19/2019 8.3 (L)   Albumin (g/dL)  Date Value  06/18/2019 2.8 (L)   Phosphorus (mg/dL)  Date Value  06/19/2019 3.9   Sodium (mmol/L)  Date Value  06/19/2019 140    Electrolytes: Patient has a history of alcohol abuse. Baseline nutritional status un-clear. On tube feeds. Goal of therapy to replete electrolytes to normal levels. Patient with good UOP. Regular bowel movements. BUN trending down. Sodium stable.   -IV magnesium 2 g x1   -Continue free water flushes 100 mL q4h   -BMP, Mg, Phos tomorrow AM  Constipation: Patient is having regular bowel movements. Constipating medications include hydromorphone, doxepin (tapering), cyproheptadine. Rectal tube was placed on 9/29. C diff panel 10/6 negative.    -Discontinue bisacodyl suppository PRN  Glucose: No baseline A1c in chart and no history of diabetes recorded. Currently on tube feeds. Medications that could contribute to development of new-onset diabetes include quetiapine. Patient received methylprednisolone 60mg  IV Q6hr x 4 doses on 10/3-10/4. Blood glucoses elevated on AM BMP 10/4-10/5 days likely secondary to steroids. Glucose normalized this morning.     -Continue to monitor blood glucoses.   Pharmacy will continue to monitor and adjust per consult.   [place] Resident 06/19/2019 2:13 PM

## 2019-06-19 NOTE — Consult Note (Signed)
Pharmacy Antibiotic Note  Thomas [name] is a 53 y.o. male with history of psychiatric disorders and polysubstance abuse admitted on 06/07/2019 with pneumonia. Patient presented initially for peripheral revascularization of ischemic limb and ORIF of right ankle fracture. Post-operatively he became combative toward staff and was subsequently intubated. He has required high doses of sedatives this admission and each attempt to wean has resulted in further agitation and combativeness. Patient has completed a course of Unasyn this admission due to c/f aspiration pneumonia. Both bacteria growing in tracheal aspirate are susceptible to Bactrim. Bactrim is the therapy of choice for Stenotrophomonas due to reliable in vitro activity. Pharmacy has been consulted for sulfamethoxazole-trimethoprim dosing.   WBC have been trending up with a slight left shift noted on differential. Patient with mild fever this morning.    Plan: Sulfamethoxazole-trimethoprim 320 mg IV q8h (12.5 mg/kg trimethoprim component)  Recommended duration of therapy is 7 days for stenotrophomonas pneumonia. Continue to monitor for adverse effects and for signs of clinical improvement.   Height: 6' 0.01" (182.9 cm) Weight: 168 lb 14 oz (76.6 kg) IBW/kg (Calculated) : 77.62  Temp (24hrs), Avg:98.5 F (36.9 C), Min:97.3 F (36.3 C), Max:100.7 F (38.2 C)  Recent Labs  Lab 06/14/19 0507 06/15/19 0512 06/15/19 2125 06/16/19 0426 06/17/19 0424 06/18/19 0335 06/19/19 0505  WBC 2.5* 4.5  --  5.5  --  9.5 8.6  CREATININE 0.46* 0.55* 0.60* 0.64 0.50* 0.58* 0.63    Estimated Creatinine Clearance: 115.7 mL/min (by C-G formula based on SCr of 0.63 mg/dL).    Allergies  Allergen Reactions  . Haldol [Haloperidol Lactate]     Antimicrobials this admission: Unasyn 9/30 >> 10/3 Levofloxacin 10/3 x1  Bactrim 10/6 >>  Microbiology results: 10/6 C diff: negative 9/30 Tracheal aspirate: Klebsiella oxytoca, Stenotrophomonas  maltophilia 9/25 MRSA PCR: negative 9/24 COVID 19: negative  Thank you for allowing pharmacy to be a part of this patient's care.  [place] Resident 06/19/2019 2:38 PM

## 2019-06-19 NOTE — Progress Notes (Addendum)
Calmer today. Sedation decreased to half of the dose that he started with today. Tolerated spontaneous mode on ventilator today for 4 hours today. Would not follow commands so DR. Kasa chose not to extubate. Spiked a temperature at 1500. Dr. Mortimer Fries aware. Levophed turned off at 1000 this morning. Copious amounts of tan sputum from ETT tube noted. Antibiotic changed to [facility] per Dr. Duwayne Heck. Resting at intervals. Metoprolol 2.5  IVP given x 2 for heart rate greater than 120.

## 2019-06-19 NOTE — Progress Notes (Signed)
Follow up - Critical Care Medicine Note  Patient Details:    Thomas [name] is an 53 y.o. male with multiple comorbidities to include bipolar disorder and schizophrenia, seizure disorder, major depressive disorder with psychotic episodes and alcohol abuse presented with right ankle pain found to have a fracture and also found to have critical limb ischemia of the right lower extremity.  Patient had to have stents placed for his limb ischemia, post operatively he was too combative and require intubation and sedation for management.  He remains on the ventilator, remains critically ill.  Lines, Airways, Drains: Airway 7.5 mm (Active)  Secured at (cm) 25 cm 06/18/19 0820  Measured From Lips 06/18/19 0820  Secured Location Right 06/18/19 0820  Secured By [employer] Tube Holder 06/18/19 0820  Tube Holder Repositioned Yes 06/18/19 0820  Cuff Pressure (cm H2O) 30 cm H2O 06/18/19 0820  Site Condition Dry 06/18/19 0820     PICC Double Lumen 06/12/19 PICC Right Brachial 44 cm 2 cm (Active)  Indication for Insertion or Continuance of Line Limited venous access - need for IV therapy >5 days (PICC only);Poor Vasculature-patient has had multiple peripheral attempts or PIVs lasting less than 24 hours 06/18/19 0400  Exposed Catheter (cm) 2 cm 06/12/19 1530  Site Assessment Clean;Dry;Intact 06/18/19 0400  Lumen #1 Status Infusing;Flushed 06/18/19 0400  Lumen #2 Status Infusing;Flushed 06/18/19 0400  Dressing Type Transparent;Occlusive 06/18/19 0400  Dressing Status Clean;Intact;Antimicrobial disc in place;Dry 06/18/19 0400  Line Care Lumen 1 tubing changed;Lumen 2 tubing changed;Connections checked and tightened 06/16/19 1000  Dressing Intervention Other (Comment) 06/14/19 0800  Dressing Change Due 06/19/19 06/17/19 1000     NG/OG Tube Orogastric 18 Fr. Center mouth Xray Documented cm marking at nare/ corner of mouth 60 cm (Active)  Cm Marking at Nare/Corner of Mouth (if applicable) 60 cm 06/18/19  0400  External Length of Tube (cm) - (if applicable) 60 cm 06/15/19 1957  Site Assessment Clean;Intact;Dry 06/18/19 0400  Ongoing Placement Verification No acute changes, not attributed to clinical condition;No change in respiratory status;No change in cm markings or external length of tube from initial placement 06/18/19 0400  Status Infusing tube feed 06/18/19 0400  Drainage Appearance None 06/17/19 1608  Intake (mL) 0 mL 06/17/19 2000  Output (mL) 0 mL 06/18/19 0400     Rectal Tube/Pouch (Active)  Output (mL) 100 mL 06/18/19 0400  Intake (mL) 30 mL 06/17/19 2000     Urethral Catheter Brent Generalaylor Foster, RN (Active)  Indication for Insertion or Continuance of Catheter Unstable critically ill patients first 24-48 hours (See Criteria);Therapy based on hourly urine output monitoring and documentation for critical condition (NOT STRICT I&O) 06/18/19 0400  Site Assessment Intact 06/18/19 0400  Catheter Maintenance Seal intact;No dependent loops;Insertion date on drainage bag;Drainage bag/tubing not touching floor;Bag below level of bladder;Catheter secured 06/18/19 0400  Collection Container Standard drainage bag 06/18/19 0400  Securement Method Leg strap 06/18/19 0400  Urinary Catheter Interventions (if applicable) Unclamped 06/18/19 0400  Input (mL) 0 mL 06/17/19 2000  Output (mL) 200 mL 06/18/19 0435    Anti-infectives:  Anti-infectives (From admission, onward)   Start     Dose/Rate Route Frequency Ordered Stop   06/19/19 1130  sulfamethoxazole-trimethoprim (BACTRIM) 320 mg in dextrose 5 % 500 mL IVPB     320 mg 346.7 mL/hr over 90 Minutes Intravenous Every 8 hours 06/19/19 1127     06/16/19 1000  levofloxacin (LEVAQUIN) IVPB 750 mg     750 mg 100 mL/hr over 90 Minutes Intravenous  Every 24 hours 06/16/19 0906 06/16/19 1955   06/13/19 1600  Ampicillin-Sulbactam (UNASYN) 3 g in sodium chloride 0.9 % 100 mL IVPB  Status:  Discontinued     3 g 200 mL/hr over 30 Minutes Intravenous Every 6  hours 06/13/19 1444 06/16/19 0906   06/08/19 1800  ceFAZolin (ANCEF) IVPB 2g/100 mL premix     2 g 200 mL/hr over 30 Minutes Intravenous Every 8 hours 06/08/19 1730 06/09/19 1900   06/08/19 1800  clindamycin (CLEOCIN) IVPB 600 mg     600 mg 100 mL/hr over 30 Minutes Intravenous Every 8 hours 06/08/19 1730 06/09/19 2159   06/08/19 0000  ceFAZolin (ANCEF) IVPB 1 g/50 mL premix  Status:  Discontinued    Note to Pharmacy: Send with pt to OR   1 g 100 mL/hr over 30 Minutes Intravenous On call 06/07/19 2030 06/07/19 2251      Microbiology: Results for orders placed or performed during the hospital encounter of 06/07/19  SARS Coronavirus 2 [facility] order, Performed in [facility] hospital lab) Nasopharyngeal Nasopharyngeal Swab     Status: None   Collection Time: 06/07/19  6:34 PM   Specimen: Nasopharyngeal Swab  Result Value Ref Range Status   SARS Coronavirus 2 NEGATIVE NEGATIVE Final    Comment: (NOTE) If result is NEGATIVE SARS-CoV-2 target nucleic acids are NOT DETECTED. The SARS-CoV-2 RNA is generally detectable in upper and lower  respiratory specimens during the acute phase of infection. The lowest  concentration of SARS-CoV-2 viral copies this assay can detect is 250  copies / mL. A negative result does not preclude SARS-CoV-2 infection  and should not be used as the sole basis for treatment or other  patient management decisions.  A negative result may occur with  improper specimen collection / handling, submission of specimen other  than nasopharyngeal swab, presence of viral mutation(s) within the  areas targeted by this assay, and inadequate number of viral copies  (<250 copies / mL). A negative result must be combined with clinical  observations, patient history, and epidemiological information. If result is POSITIVE SARS-CoV-2 target nucleic acids are DETECTED. The SARS-CoV-2 RNA is generally detectable in upper and lower  respiratory specimens dur ing the acute phase  of infection.  Positive  results are indicative of active infection with SARS-CoV-2.  Clinical  correlation with patient history and other diagnostic information is  necessary to determine patient infection status.  Positive results do  not rule out bacterial infection or co-infection with other viruses. If result is PRESUMPTIVE POSTIVE SARS-CoV-2 nucleic acids MAY BE PRESENT.   A presumptive positive result was obtained on the submitted specimen  and confirmed on repeat testing.  While 2019 novel coronavirus  (SARS-CoV-2) nucleic acids may be present in the submitted sample  additional confirmatory testing may be necessary for epidemiological  and / or clinical management purposes  to differentiate between  SARS-CoV-2 and other Sarbecovirus currently known to infect humans.  If clinically indicated additional testing with an alternate test  methodology [phone]) is advised. The SARS-CoV-2 RNA is generally  detectable in upper and lower respiratory sp ecimens during the acute  phase of infection. The expected result is Negative. Fact Sheet for Patients:  BoilerBrush.com.cy Fact Sheet for Healthcare Providers: https://pope.com/ This test is not yet approved or cleared by the [place] FDA and has been authorized for detection and/or diagnosis of SARS-CoV-2 by FDA under an Emergency Use Authorization (EUA).  This EUA will remain in effect (meaning this test can  be used) for the duration of the COVID-19 declaration under Section 564(b)(1) of the Act, 21 U.S.C. section 360bbb-3(b)(1), unless the authorization is terminated or revoked sooner. Performed at [facility], [address] [place], [place] [address]   MRSA PCR Screening     Status: None   Collection Time: 06/08/19  5:53 AM   Specimen: Nasal Mucosa; Nasopharyngeal  Result Value Ref Range Status   MRSA by PCR NEGATIVE NEGATIVE Final    Comment:        The  GeneXpert MRSA Assay (FDA approved for NASAL specimens only), is one component of a comprehensive MRSA colonization surveillance program. It is not intended to diagnose MRSA infection nor to guide or monitor treatment for MRSA infections. Performed at [facility], [address] [place], [place] [address]   Culture, respiratory     Status: None   Collection Time: 06/13/19  3:28 PM   Specimen: Tracheal Aspirate  Result Value Ref Range Status   Specimen Description   Final    TRACHEAL ASPIRATE Performed at [facility], [address] [place], [place] [address]    Special Requests   Final    NONE Performed at [facility], [address] [place], [place] [address]    Gram Stain   Final    ABUNDANT WBC PRESENT, PREDOMINANTLY PMN ABUNDANT GRAM POSITIVE COCCI RARE GRAM NEGATIVE RODS Performed at [facility], [place] [address], [place], [place] [address]    Culture   Final    ABUNDANT KLEBSIELLA OXYTOCA ABUNDANT STENOTROPHOMONAS MALTOPHILIA    Report Status 06/17/2019 FINAL  Final   Organism ID, Bacteria KLEBSIELLA OXYTOCA  Final   Organism ID, Bacteria STENOTROPHOMONAS MALTOPHILIA  Final      Susceptibility   Klebsiella oxytoca - MIC*    AMPICILLIN >=32 RESISTANT Resistant     CEFAZOLIN >=64 RESISTANT Resistant     CEFEPIME <=1 SENSITIVE Sensitive     CEFTAZIDIME <=1 SENSITIVE Sensitive     CEFTRIAXONE <=1 SENSITIVE Sensitive     CIPROFLOXACIN <=0.25 SENSITIVE Sensitive     GENTAMICIN <=1 SENSITIVE Sensitive     IMIPENEM <=0.25 SENSITIVE Sensitive     TRIMETH/SULFA <=20 SENSITIVE Sensitive     AMPICILLIN/SULBACTAM 4 SENSITIVE Sensitive     PIP/TAZO <=4 SENSITIVE Sensitive     Extended ESBL NEGATIVE Sensitive     * ABUNDANT KLEBSIELLA OXYTOCA   Stenotrophomonas maltophilia - MIC*    LEVOFLOXACIN 0.25 SENSITIVE Sensitive     TRIMETH/SULFA <=20 SENSITIVE Sensitive     * ABUNDANT STENOTROPHOMONAS MALTOPHILIA  C difficile quick scan w  PCR reflex     Status: None   Collection Time: 06/19/19  8:52 AM   Specimen: STOOL  Result Value Ref Range Status   C Diff antigen NEGATIVE NEGATIVE Final   C Diff toxin NEGATIVE NEGATIVE Final   C Diff interpretation No C. difficile detected.  Final    Comment: Performed at [facility], [address] [place], [place] [address]    Best Practice/Protocols:  VTE Prophylaxis: Lovenox (prophylaxtic dose) GI Prophylaxis: Antihistamine Continous Sedation  Events: 9/24- Presented to ED with displaced right ankle fracture and limb ischemia 9/24- Vascular surgery to revascularize right lower extremity 9/25- Open reduction internal fixation of right ankle fracture medially and laterally 9/26- patient on precedex and multiple PRN IV meds still tachycardic with episodes of aggitation and combative/aggresive behavior. Concern for loss of airway protection 9/27- patient attempting to physically attack staff and loudly verbally  abusive, he is in high doses of IV sedation. After additional PRN IV medications patient temporarilly sedated with episodic apnea. Pt sedated and intubated. 9/28 failed SAT due to severe agitation and delirium 9/29PICC line placed 9/30 failed SAT again for severe agitation and delirium 9/30 CXR suspicious for right lower lobe pneumonia, began treatment with Unasyn 10/2 Pt failed spontaneous breathing trial with sedation 10/3 remains on the ventilator family and do so informed ICU MD with regards to seizure and psychiatric issues recently. 10/4 updated mother at bedside.  Instituted changes to sedatives. 10/6 Started SBTs  Studies: Dg Ankle 2 Views Right  Result Date: 06/07/2019 CLINICAL DATA:  Medial and lateral malleolar fracture subluxation status post reduction. EXAM: RIGHT ANKLE - 2 VIEW COMPARISON:  Right ankle x-rays from same day. FINDINGS: Improved alignment of the medial malleolar and distal fibular fractures status post reduction. Residual 2  mm posterior displacement of the distal fibular fracture with 1.5 cm overriding. Slight residual widening of the medial clear space. Joint spaces are preserved. Bone mineralization is normal. Unchanged diffuse soft tissue swelling. IMPRESSION: Improved alignment status post reduction of the medial and lateral malleolar fractures. Electronically Signed   By: Obie Dredge M.D.   On: 06/07/2019 20:18   Dg Ankle Complete Right  Result Date: 06/08/2019 CLINICAL DATA:  Status post surgery to right ankle. EXAM: RIGHT ANKLE - COMPLETE 3+ VIEW COMPARISON:  None. FINDINGS: A plate has been placed across the distal fibular fracture. Two screws been placed through the medial malleolus. Skin staples are identified. IMPRESSION: Repair of right ankle fractures as above. Electronically Signed   By: Gerome Sam III M.D   On: 06/08/2019 17:37   Dg Ankle Complete Right  Result Date: 06/07/2019 CLINICAL DATA:  Per EMS report, patient slipped and fell in his kitchen and was witnessed by his wife. Patient has deformity to right ankle. Patient was given of Fentanyl en route by EMT. Patient denies relief of the pain. Patient arrives with right lower leg splinted. EXAM: RIGHT ANKLE - COMPLETE 3+ VIEW COMPARISON:  None. FINDINGS: There is a transverse fracture across the base of the medial malleolus and an oblique fracture of the distal fibula extending from the metadiaphysis to the distal metaphysis at the level of the ankle joint. The talus is subluxed posteriorly and laterally by approximately 5 mm. The distal fibular fracture is displaced posteriorly by 1.3 cm. There is surrounding soft tissue swelling. IMPRESSION: 1. Displaced fractures of the medial malleolus and distal fibula with subluxation of the talus as detailed. No dislocation. Electronically Signed   By: Amie Portland M.D.   On: 06/07/2019 18:41   Dg Abd 1 View  Result Date: 06/11/2019 CLINICAL DATA:  Oral gastric tube placement EXAM: ABDOMEN - 1 VIEW  COMPARISON:  06/10/2019 FINDINGS: Lung bases are clear. Esophageal tube tip overlies the proximal stomach, side-port at the cardia of the stomach. Large stool in the upper abdomen. Coils to the right of low L2. IMPRESSION: Esophageal tube tip overlies the proximal stomach. Electronically Signed   By: Jasmine Pang M.D.   On: 06/11/2019 23:27   Dg Abd 1 View  Result Date: 06/10/2019 CLINICAL DATA:  Evaluate NG tube EXAM: ABDOMEN - 1 VIEW COMPARISON:  None. FINDINGS: The NG tube is been reposition with the side port and distal tip now located in the stomach. IMPRESSION: The NG tube terminates in the stomach. Electronically Signed   By: Gerome Sam III M.D   On: 06/10/2019 13:39  Dg Abd 1 View  Result Date: 06/10/2019 CLINICAL DATA:  Evaluate NG tube placement EXAM: ABDOMEN - 1 VIEW COMPARISON:  None. FINDINGS: The NG tube is located in the right lower lobe of the lung. A subsequent images already been taken after repositioning. IMPRESSION: The NG tube terminates in the right lower lobe of the lung. A subsequent images has already been obtained after repositioning. Electronically Signed   By: Dorise Bullion III M.D   On: 06/10/2019 13:39   Dg Chest Port 1 View  Result Date: 06/18/2019 CLINICAL DATA:  Respiratory failure EXAM: PORTABLE CHEST 1 VIEW COMPARISON:  Chest radiograph 06/15/2019 FINDINGS: ET tube mid trachea. Right upper extremity PICC line tip projects over the superior vena cava. Monitoring leads overlie the patient. Enteric tube courses inferior to the diaphragm. Stable cardiac and mediastinal contours. Similar patchy consolidation right mid lower lung. Minimal left basilar atelectasis. No pleural effusion. Thoracic spine degenerative changes. IMPRESSION: Similar-appearing right mid and lower lung patchy consolidation which may represent pneumonia in the appropriate clinical setting. Electronically Signed   By: Lovey Newcomer M.D.   On: 06/18/2019 09:58   Dg Chest Port 1 View  Result  Date: 06/15/2019 CLINICAL DATA:  Acute respiratory failure and hypoxia. EXAM: PORTABLE CHEST 1 VIEW COMPARISON:  Chest radiograph dated 06/13/2019. FINDINGS: An endotracheal tube terminates in the midthoracic trachea. An enteric tube enters the stomach and terminates below the field of view. A right upper extremity central venous catheter tip overlies the superior cavoatrial junction. Cervical spinal fusion hardware is seen. Bilateral interstitial and airspace opacities are not significantly changed since prior exam and are most significant in the right lower lung. There is no pleural effusion or pneumothorax. The cardiomediastinal silhouette is unchanged. IMPRESSION: Unchanged bilateral interstitial and airspace opacities which are most significant in the right lower lung. Electronically Signed   By: Zerita Boers M.D.   On: 06/15/2019 08:39   Dg Chest Port 1 View  Result Date: 06/13/2019 CLINICAL DATA:  Hypoxia, smoker, hypertension EXAM: PORTABLE CHEST 1 VIEW COMPARISON:  Portable exam 1021 hours compared to 06/12/2019 FINDINGS: Tip of endotracheal tube projects 4.0 cm above carina. Nasogastric tube extends into stomach. RIGHT arm PICC line tip projects over superior RIGHT atrium. Normal heart size, mediastinal contours, and pulmonary vascularity. Developing opacity at RIGHT lower lobe question atelectasis versus infiltrate. Slight accentuation of interstitial markings bilaterally. No pleural effusion or pneumothorax. No acute osseous findings. IMPRESSION: Stable support lines/tubes. Developing atelectasis versus infiltrate at RIGHT base. Electronically Signed   By: Lavonia Dana M.D.   On: 06/13/2019 10:38   Dg Chest Port 1 View  Result Date: 06/12/2019 CLINICAL DATA:  PICC line placement EXAM: PORTABLE CHEST 1 VIEW COMPARISON:  One day prior FINDINGS: Cervical spine fixation. Endotracheal tube terminates 5.5 cm above carina. Nasogastric tube extends beyond the inferior aspect of the film. Right PICC line  tip at low SVC or superior caval/atrial junction. Numerous leads and wires project over the chest. Normal heart size. No pleural effusion or pneumothorax. No lobar consolidation. IMPRESSION: Appropriate position of right-sided PICC line, without pneumothorax. Electronically Signed   By: Abigail Miyamoto M.D.   On: 06/12/2019 16:11   Dg Chest Port 1 View  Result Date: 06/11/2019 CLINICAL DATA:  Acute respiratory failure. EXAM: PORTABLE CHEST 1 VIEW COMPARISON:  06/10/2019. FINDINGS: Endotracheal tube and stable position. NG tube tip has now been repositioned and is now below left hemidiaphragm. Heart size normal. Low lung volumes. Mild left base atelectasis/infiltrate. No pleural effusion  or pneumothorax. IMPRESSION: 1. NG tube tip is now been repositioned and is now below left hemidiaphragm. Endotracheal tube in stable position. 2.  Low lung volumes.  Mild left base atelectasis/infiltrate. Electronically Signed   By: Maisie Fus  Register   On: 06/11/2019 06:54   Dg Chest Port 1 View  Result Date: 06/10/2019 CLINICAL DATA:  Endotracheal tube placement. EXAM: PORTABLE CHEST 1 VIEW COMPARISON:  None. FINDINGS: The heart size and mediastinal contours are within normal limits. Endotracheal tube is in grossly good position. Nasogastric tube is seen in right lower lobe bronchus. No pneumothorax or pleural effusion is noted. Both lungs are clear. The visualized skeletal structures are unremarkable. IMPRESSION: Endotracheal tube in grossly good position. Nasogastric tube seen in right lower lobe bronchus; I spoke with the patient's nurse, Ladona Ridgel, who stated that they are aware of this finding. No other abnormality seen in the chest. Electronically Signed   By: Lupita Raider M.D.   On: 06/10/2019 13:40   [place] Ekg Site Rite  Result Date: 06/12/2019 If Site Rite image not attached, placement could not be confirmed due to current cardiac rhythm.   Consults: Treatment Team:  Deeann Saint, MD Cherly Beach,  DO [facility], [facility], MD Jama Flavors, MD Schnier, Latina Craver, MD   Subjective:    Overnight Issues: On Depacon at q8h frequency. Decreasing IV sedatives.  Not as agitated but still with episodes of agitation and impulsiveness.  Start SBTs today.  Objective:  Vital signs for last 24 hours: Temp:  [97.9 F (36.6 C)-103.3 F (39.6 C)] 101.9 F (38.8 C) (10/06 1800) Pulse Rate:  [60-150] 131 (10/06 1900) BP: (83-166)/(60-98) 121/78 (10/06 1900) SpO2:  [90 %-100 %] 97 % (10/06 1947) FiO2 (%):  [30 %] 30 % (10/06 1947)  Hemodynamic parameters for last 24 hours:    Intake/Output from previous day: 10/05 0701 - 10/06 0700 In: 3253 [I.V.:1168.8; NG/GT:1156; IV Piggyback:888.3] Out: 2550 [Urine:2250; Emesis/NG output:100; Stool:200]  Intake/Output this shift: No intake/output data recorded.  Vent settings for last 24 hours: Vent Mode: PRVC FiO2 (%):  [30 %] 30 % Set Rate:  [16 bmp] 16 bmp Vt Set:  [500 mL] 500 mL PEEP:  [5 cmH20] 5 cmH20 Pressure Support:  [0 cmH20] 0 cmH20 Plateau Pressure:  [16 cmH20-17 cmH20] 17 cmH20  Physical Exam:  GENERAL:critically ill appearing, intubated and sedated, bilateral temporal wasting HEAD: Normocephalic, atraumatic.  EYES: Pupils equal, round, reactive to light.  No scleral icterus.  MOUTH: Orotracheally intubated.  Oral mucosa moist.  G in place. NECK: Supple. No JVD.  No crepitus. PULMONARY: Coarse breath sounds bilaterally.  No wheezes or rhonchi noted. CARDIOVASCULAR: S1 and S2. Regular rate and rhythm. No murmurs, rubs, or gallops.  GASTROINTESTINAL: Soft, nontender, non-distended.Positive bowel sounds.  MUSCULOSKELETAL: No swelling, clubbing, or edema.  NEUROLOGIC: Heavily sedated, RASS -3, agitated when sedation lightened. SKIN:intact,warm,dry    Assessment/Plan:   1.  Acute hypoxic respiratory failure: Initial requirement for intubation was extreme agitation and need for increasing sedatives.  Patient has underlying mental  illness.  Course complicated by right lower lobe aspiration pneumonia Klebsiella/Stenotrophomonas.  Continue supportive care.  Currently appears less agitated on SAT.  Continue bronchodilators and pulmonary toilet.  SATs and SBT's as tolerated.  Initiated SBT today as he appears more tractable.  2.  Underlying history of schizophrenia, bipolar disorder and major depressive disorder with frequent psychotic episodes: This issue adds complexity to his management as is affecting his requirements for sedatives.  Continue Depacon for mood stabilization.  Low-dose Periactin for potential serotonergic surge.  Wean off IV sedatives as tolerated.  Continue nighttime Seroquel.  Per psychiatry Sinequan is being weaned off.  3.  Alcohol abuse with delirium tremens: This issue adds complexity to his management.  Increased thiamine to 500 mg dose x3 days in the event he may be entering a Warnicke's encephalopathy phase.  Continue CIWA protocol.  4.  Encephalopathy: Multifactorial issues with prior psychotic episodes due to #2 above and issues with delirium tremens due to 3 above.  In addition patient is critically ill and may be having also issues with ICU delirium.  Continue IV sedatives with Precedex and Dilaudid, transition off these IV infusions as tolerated.  Continue Seroquel at at bedtime, thiamine as above.  Note is made that patient does have a history of seizure disorder will check magnesium level and replete if needed, Depacon has been added not as an anti-convulsant but has mood stabilizer.  5.  Klebsiella/Stenotrophomonas pneumonia: Switch Levaquin to Bactrim.  Stenotrophomonas more sensitive to Bactrim.  6.  Nutrition: Continue tube feeds.  7.  ICU Best practice: Patient is on GI and and DVT prophylaxis as above.  Constipation protocol as indicated, hyperglycemia protocol as indicated.  8.  Transient hypotension: Patient has responded to IV fluid boluses but has required low-dose norepinephrine (2-3  mcg/min).  Likely etiology is heavy doses of sedatives required.   Overall, patient is critically ill, prognosis is guarded. High risk for cardiac arrest and death.  Discussed with patient's mother at bedside, she was fully apprised.    LOS: 12 days   Additional comments: Multidisciplinary rounds were performed with critical care team.  Critical Care Total Time*: 40 Minutes  C. Danice Goltz, MD [facility] 06/19/2019  *Care during the described time interval was provided by me and/or other providers on the critical care team.  I have reviewed this patient's available data, including medical history, events of note, physical examination and test results as part of my evaluation.

## 2019-06-19 NOTE — Progress Notes (Signed)
1500 Oral temperature 103.3. Given prn Tylenol and ice packs placed in axilla and groin areas

## 2019-06-19 NOTE — Consult Note (Signed)
  Patient seen.  Case discussed with treatment team.  Patient remains intubated and sedated.  Patient seems overall calmer today.  Attempt was made to to extubate, however patient started to decline shortly after extubation and was reintubated.  Patient still receiving psychiatric medications at this time.  Psychiatry will continue to follow.

## 2019-06-20 ENCOUNTER — Inpatient Hospital Stay: Payer: Medicaid Other

## 2019-06-20 DIAGNOSIS — F05 Delirium due to known physiological condition: Secondary | ICD-10-CM

## 2019-06-20 DIAGNOSIS — A498 Other bacterial infections of unspecified site: Secondary | ICD-10-CM

## 2019-06-20 LAB — BASIC METABOLIC PANEL
Anion gap: 10 (ref 5–15)
BUN: 19 mg/dL (ref 6–20)
CO2: 29 mmol/L (ref 22–32)
Calcium: 8.5 mg/dL — ABNORMAL LOW (ref 8.9–10.3)
Chloride: 100 mmol/L (ref 98–111)
Creatinine, Ser: 0.65 mg/dL (ref 0.61–1.24)
GFR calc Af Amer: >60 mL/min (ref >60)
GFR calc non Af Amer: >60 mL/min (ref >60)
Glucose, Bld: 120 mg/dL — ABNORMAL HIGH (ref 70–99)
Potassium: 3.5 mmol/L (ref 3.5–5.1)
Sodium: 139 mmol/L (ref 135–145)

## 2019-06-20 LAB — CBC
HCT: 34.7 % — ABNORMAL LOW (ref 39.0–52.0)
Hemoglobin: 11.3 g/dL — ABNORMAL LOW (ref 13.0–17.0)
MCH: 33.2 pg (ref 26.0–34.0)
MCHC: 32.6 g/dL (ref 30.0–36.0)
MCV: 102.1 fL — ABNORMAL HIGH (ref 80.0–100.0)
Platelets: 289 10*3/uL (ref 150–400)
RBC: 3.4 MIL/uL — ABNORMAL LOW (ref 4.22–5.81)
RDW: 13.5 % (ref 11.5–15.5)
WBC: 11.1 10*3/uL — ABNORMAL HIGH (ref 4.0–10.5)
nRBC: 0 % (ref 0.0–0.2)

## 2019-06-20 LAB — MAGNESIUM: Magnesium: 2.2 mg/dL (ref 1.7–2.4)

## 2019-06-20 LAB — VALPROIC ACID LEVEL: Valproic Acid Lvl: 37 ug/mL — ABNORMAL LOW (ref 50.0–100.0)

## 2019-06-20 MED ORDER — CHLORPROMAZINE HCL 25 MG/ML IJ SOLN
50.0000 mg | Freq: Three times a day (TID) | INTRAMUSCULAR | Status: DC
  Administered 2019-06-20: 50 mg via INTRAMUSCULAR
  Filled 2019-06-20 (×2): qty 2

## 2019-06-20 MED ORDER — FUROSEMIDE 10 MG/ML IJ SOLN: 20.0000 mg | Freq: Once | INTRAMUSCULAR | Status: AC

## 2019-06-20 MED ORDER — LORAZEPAM 2 MG/ML IJ SOLN
INTRAMUSCULAR | Status: AC
  Filled 2019-06-20: qty 1

## 2019-06-20 MED ORDER — FUROSEMIDE 10 MG/ML IJ SOLN
INTRAMUSCULAR | Status: AC
  Administered 2019-06-20: 20 mg
  Filled 2019-06-20: qty 2

## 2019-06-20 MED ORDER — ORAL CARE MOUTH RINSE
15.0000 mL | Freq: Two times a day (BID) | OROMUCOSAL | Status: DC
  Administered 2019-06-20 – 2019-07-09 (×29): 15 mL via OROMUCOSAL

## 2019-06-20 MED ORDER — POTASSIUM CHLORIDE 20 MEQ PO PACK
40.0000 meq | PACK | Freq: Once | ORAL | Status: AC
  Administered 2019-06-20: 11:00:00 40 meq
  Filled 2019-06-20: qty 2

## 2019-06-20 MED ORDER — SODIUM CHLORIDE 0.9 % IV SOLN
50.0000 mg | Freq: Three times a day (TID) | INTRAVENOUS | Status: DC
  Administered 2019-06-20 (×2): 50 mg via INTRAVENOUS
  Filled 2019-06-20 (×5): qty 2

## 2019-06-20 MED ORDER — LORAZEPAM 2 MG/ML IJ SOLN
2.0000 mg | Freq: Once | INTRAMUSCULAR | Status: AC
  Administered 2019-06-20: 2 mg via INTRAVENOUS

## 2019-06-20 MED ORDER — BUPRENORPHINE HCL-NALOXONE HCL 8-2 MG SL SUBL
1.0000 | SUBLINGUAL_TABLET | Freq: Three times a day (TID) | SUBLINGUAL | Status: DC
  Administered 2019-06-20 – 2019-07-07 (×36): 1 via SUBLINGUAL
  Filled 2019-06-20 (×30): qty 1
  Filled 2019-06-20: qty 8
  Filled 2019-06-20 (×10): qty 1

## 2019-06-20 MED ORDER — LORAZEPAM 2 MG/ML IJ SOLN
INTRAMUSCULAR | Status: AC
  Administered 2019-06-21
  Filled 2019-06-20: qty 1

## 2019-06-20 MED ORDER — LORAZEPAM 2 MG/ML IJ SOLN
1.0000 mg | INTRAMUSCULAR | Status: DC | PRN
  Administered 2019-06-20 – 2019-06-25 (×16): 2 mg via INTRAVENOUS
  Filled 2019-06-20 (×17): qty 1

## 2019-06-20 MED ORDER — ZIPRASIDONE MESYLATE 20 MG IM SOLR
20.0000 mg | Freq: Once | INTRAMUSCULAR | Status: AC
  Administered 2019-06-20: 20 mg via INTRAMUSCULAR
  Filled 2019-06-20 (×2): qty 20

## 2019-06-20 MED ORDER — LORAZEPAM 2 MG/ML IJ SOLN
1.0000 mg | Freq: Once | INTRAMUSCULAR | Status: AC
  Administered 2019-06-20: 1 mg via INTRAVENOUS

## 2019-06-20 NOTE — Progress Notes (Signed)
Nutrition Follow-up  RD working remotely.  DOCUMENTATION CODES:   Not applicable  INTERVENTION:  Patient was just extubated. Will monitor for diet advancement and adequacy of intake. Patient would benefit from Ensure Max Protein po BID once diet advanced, each supplement provides 150 kcal and 30 grams of protein.  NUTRITION DIAGNOSIS:   Inadequate oral intake related to inability to eat as evidenced by NPO status.  Ongoing.   GOAL:   Patient will meet greater than or equal to 90% of their needs  Previously met with TF regimen. Patient was just extubated.  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 Pt now s/p vascular repair 9/24 and ORIF 9/25. Pt devloped withdrawl symptoms and agitation on 9/27 requiring intubation  Patient was just extubated. Will monitor for plan and for diet advancement once appropriate.  Medications reviewed and include: famotidine, folic acid 1 mg daily, gabapentin, liquid MVI daily per tube, Seroquel, thiamine 100 mg daily per tube, Precedex gtt, Dilaudid gtt, Versed gtt, norepinephrine gtt now off, Bactrim, valproate.  Labs reviewed.  Diet Order:   Diet Order    None     EDUCATION NEEDS:   No education needs have been identified at this time  Skin:  Skin Assessment: Skin Integrity Issues:(closed incision right ankle; ecchymosis)  Last BM:  06/14/2019 - medium type 7 per rectal tube  Height:   Ht Readings from Last 1 Encounters:  06/15/19 6' 0.01" (1.829 m)   Weight:   Wt Readings from Last 1 Encounters:  06/07/19 76.6 kg   Ideal Body Weight:  80.9  kg  BMI:  Body mass index is 22.9 kg/m.  Estimated Nutritional Needs:   Kcal:  2000-2200  Protein:  100-110 grams  Fluid:  2-2.2 L/day  Willey Blade, MS, RD, LDN Office: [phone] Pager: [phone] After Hours/Weekend Pager: [phone]

## 2019-06-20 NOTE — Progress Notes (Signed)
Pt extubated to HFNC 40L 74% per MD order.

## 2019-06-20 NOTE — Consult Note (Signed)
Pharmacy Antibiotic Note  Thomas [name] is a 53 y.o. male with history of psychiatric disorders and polysubstance abuse admitted on 06/07/2019 with pneumonia. Patient presented initially for peripheral revascularization of ischemic limb and ORIF of right ankle fracture. Post-operatively he became combative toward staff and was subsequently intubated. He has required high doses of sedatives this admission and each attempt to wean has resulted in further agitation and combativeness. Patient has completed a course of Unasyn this admission due to c/f aspiration pneumonia. Both bacteria growing in tracheal aspirate are susceptible to Bactrim. Bactrim is the therapy of choice for Stenotrophomonas due to reliable in vitro activity. Pharmacy has been consulted for sulfamethoxazole-trimethoprim dosing.   10/7 CXR significant for slight progression of hazy infiltrate in right lung base and new hazy infiltrate in left lung base. Patient with mild fever this morning.  WBC have been trending up with a slight left shift noted on differential. Fevers appear to be resolving.    Plan: Sulfamethoxazole-trimethoprim 320 mg IV q8h (12.5 mg/kg trimethoprim component)  Today is day 2 of therapy.   Recommended duration of therapy is 7 days for stenotrophomonas pneumonia. Continue to monitor for adverse effects and for signs of clinical improvement.   Height: 6' 0.01" (182.9 cm) Weight: 168 lb 14 oz (76.6 kg) IBW/kg (Calculated) : 77.62  Temp (24hrs), Avg:100.5 F (38.1 C), Min:98.3 F (36.8 C), Max:103.3 F (39.6 C)  Recent Labs  Lab 06/15/19 0512  06/16/19 0426 06/17/19 0424 06/18/19 0335 06/19/19 0505 06/20/19 0446  WBC 4.5  --  5.5  --  9.5 8.6 11.1*  CREATININE 0.55*   < > 0.64 0.50* 0.58* 0.63 0.65   < > = values in this interval not displayed.    Estimated Creatinine Clearance: 115.7 mL/min (by C-G formula based on SCr of 0.65 mg/dL).    Allergies  Allergen Reactions  . Haldol [Haloperidol  Lactate]     Antimicrobials this admission: Unasyn 9/30 >> 10/3 Levofloxacin 10/3 x1  Bactrim 10/6 >>  Microbiology results: 10/6 C diff: negative 9/30 Tracheal aspirate: Klebsiella oxytoca, Stenotrophomonas maltophilia 9/25 MRSA PCR: negative 9/24 COVID 19: negative  Thank you for allowing pharmacy to be a part of this patient's care.  [facility] Resident 06/20/2019 1:59 PM

## 2019-06-20 NOTE — Progress Notes (Signed)
Follow up - Critical Care Medicine Note  Patient Details:    Thomas [name] is an 53 y.o. male with multiple comorbidities to include bipolar disorder and schizophrenia, seizure disorder, major depressive disorder with psychotic episodes and alcohol abuse presented with right ankle pain found to have a fracture and also found to have critical limb ischemia of the right lower extremity.  Patient had to have stents placed for his limb ischemia, post operatively he was too combative and require intubation and sedation for management.  He remains on the ventilator, remains critically ill.  Lines, Airways, Drains: Airway 7.5 mm (Active)  Secured at (cm) 25 cm 06/18/19 0820  Measured From Lips 06/18/19 0820  Secured Location Right 06/18/19 0820  Secured By [employer] Tube Holder 06/18/19 0820  Tube Holder Repositioned Yes 06/18/19 0820  Cuff Pressure (cm H2O) 30 cm H2O 06/18/19 0820  Site Condition Dry 06/18/19 0820     PICC Double Lumen 06/12/19 PICC Right Brachial 44 cm 2 cm (Active)  Indication for Insertion or Continuance of Line Limited venous access - need for IV therapy >5 days (PICC only);Poor Vasculature-patient has had multiple peripheral attempts or PIVs lasting less than 24 hours 06/18/19 0400  Exposed Catheter (cm) 2 cm 06/12/19 1530  Site Assessment Clean;Dry;Intact 06/18/19 0400  Lumen #1 Status Infusing;Flushed 06/18/19 0400  Lumen #2 Status Infusing;Flushed 06/18/19 0400  Dressing Type Transparent;Occlusive 06/18/19 0400  Dressing Status Clean;Intact;Antimicrobial disc in place;Dry 06/18/19 0400  Line Care Lumen 1 tubing changed;Lumen 2 tubing changed;Connections checked and tightened 06/16/19 1000  Dressing Intervention Other (Comment) 06/14/19 0800  Dressing Change Due 06/19/19 06/17/19 1000     NG/OG Tube Orogastric 18 Fr. Center mouth Xray Documented cm marking at nare/ corner of mouth 60 cm (Active)  Cm Marking at Nare/Corner of Mouth (if applicable) 60 cm 06/18/19  0400  External Length of Tube (cm) - (if applicable) 60 cm 06/15/19 1957  Site Assessment Clean;Intact;Dry 06/18/19 0400  Ongoing Placement Verification No acute changes, not attributed to clinical condition;No change in respiratory status;No change in cm markings or external length of tube from initial placement 06/18/19 0400  Status Infusing tube feed 06/18/19 0400  Drainage Appearance None 06/17/19 1608  Intake (mL) 0 mL 06/17/19 2000  Output (mL) 0 mL 06/18/19 0400     Rectal Tube/Pouch (Active)  Output (mL) 100 mL 06/18/19 0400  Intake (mL) 30 mL 06/17/19 2000     Urethral Catheter Brent Generalaylor Foster, RN (Active)  Indication for Insertion or Continuance of Catheter Unstable critically ill patients first 24-48 hours (See Criteria);Therapy based on hourly urine output monitoring and documentation for critical condition (NOT STRICT I&O) 06/18/19 0400  Site Assessment Intact 06/18/19 0400  Catheter Maintenance Seal intact;No dependent loops;Insertion date on drainage bag;Drainage bag/tubing not touching floor;Bag below level of bladder;Catheter secured 06/18/19 0400  Collection Container Standard drainage bag 06/18/19 0400  Securement Method Leg strap 06/18/19 0400  Urinary Catheter Interventions (if applicable) Unclamped 06/18/19 0400  Input (mL) 0 mL 06/17/19 2000  Output (mL) 200 mL 06/18/19 0435    Anti-infectives:  Anti-infectives (From admission, onward)   Start     Dose/Rate Route Frequency Ordered Stop   06/19/19 1130  sulfamethoxazole-trimethoprim (BACTRIM) 320 mg in dextrose 5 % 500 mL IVPB     320 mg 346.7 mL/hr over 90 Minutes Intravenous Every 8 hours 06/19/19 1127     06/16/19 1000  levofloxacin (LEVAQUIN) IVPB 750 mg     750 mg 100 mL/hr over 90 Minutes Intravenous  Every 24 hours 06/16/19 0906 06/16/19 1955   06/13/19 1600  Ampicillin-Sulbactam (UNASYN) 3 g in sodium chloride 0.9 % 100 mL IVPB  Status:  Discontinued     3 g 200 mL/hr over 30 Minutes Intravenous Every 6  hours 06/13/19 1444 06/16/19 0906   06/08/19 1800  ceFAZolin (ANCEF) IVPB 2g/100 mL premix     2 g 200 mL/hr over 30 Minutes Intravenous Every 8 hours 06/08/19 1730 06/09/19 1900   06/08/19 1800  clindamycin (CLEOCIN) IVPB 600 mg     600 mg 100 mL/hr over 30 Minutes Intravenous Every 8 hours 06/08/19 1730 06/09/19 2159   06/08/19 0000  ceFAZolin (ANCEF) IVPB 1 g/50 mL premix  Status:  Discontinued    Note to Pharmacy: Send with pt to OR   1 g 100 mL/hr over 30 Minutes Intravenous On call 06/07/19 2030 06/07/19 2251      Microbiology: Results for orders placed or performed during the hospital encounter of 06/07/19  SARS Coronavirus 2 [facility] order, Performed in [facility] hospital lab) Nasopharyngeal Nasopharyngeal Swab     Status: None   Collection Time: 06/07/19  6:34 PM   Specimen: Nasopharyngeal Swab  Result Value Ref Range Status   SARS Coronavirus 2 NEGATIVE NEGATIVE Final    Comment: (NOTE) If result is NEGATIVE SARS-CoV-2 target nucleic acids are NOT DETECTED. The SARS-CoV-2 RNA is generally detectable in upper and lower  respiratory specimens during the acute phase of infection. The lowest  concentration of SARS-CoV-2 viral copies this assay can detect is 250  copies / mL. A negative result does not preclude SARS-CoV-2 infection  and should not be used as the sole basis for treatment or other  patient management decisions.  A negative result may occur with  improper specimen collection / handling, submission of specimen other  than nasopharyngeal swab, presence of viral mutation(s) within the  areas targeted by this assay, and inadequate number of viral copies  (<250 copies / mL). A negative result must be combined with clinical  observations, patient history, and epidemiological information. If result is POSITIVE SARS-CoV-2 target nucleic acids are DETECTED. The SARS-CoV-2 RNA is generally detectable in upper and lower  respiratory specimens dur ing the acute phase  of infection.  Positive  results are indicative of active infection with SARS-CoV-2.  Clinical  correlation with patient history and other diagnostic information is  necessary to determine patient infection status.  Positive results do  not rule out bacterial infection or co-infection with other viruses. If result is PRESUMPTIVE POSTIVE SARS-CoV-2 nucleic acids MAY BE PRESENT.   A presumptive positive result was obtained on the submitted specimen  and confirmed on repeat testing.  While 2019 novel coronavirus  (SARS-CoV-2) nucleic acids may be present in the submitted sample  additional confirmatory testing may be necessary for epidemiological  and / or clinical management purposes  to differentiate between  SARS-CoV-2 and other Sarbecovirus currently known to infect humans.  If clinically indicated additional testing with an alternate test  methodology [phone]) is advised. The SARS-CoV-2 RNA is generally  detectable in upper and lower respiratory sp ecimens during the acute  phase of infection. The expected result is Negative. Fact Sheet for Patients:  BoilerBrush.com.cy Fact Sheet for Healthcare Providers: https://pope.com/ This test is not yet approved or cleared by the [place] FDA and has been authorized for detection and/or diagnosis of SARS-CoV-2 by FDA under an Emergency Use Authorization (EUA).  This EUA will remain in effect (meaning this test can  be used) for the duration of the COVID-19 declaration under Section 564(b)(1) of the Act, 21 U.S.C. section 360bbb-3(b)(1), unless the authorization is terminated or revoked sooner. Performed at [facility], [address] [place], [place] [address]   MRSA PCR Screening     Status: None   Collection Time: 06/08/19  5:53 AM   Specimen: Nasal Mucosa; Nasopharyngeal  Result Value Ref Range Status   MRSA by PCR NEGATIVE NEGATIVE Final    Comment:        The  GeneXpert MRSA Assay (FDA approved for NASAL specimens only), is one component of a comprehensive MRSA colonization surveillance program. It is not intended to diagnose MRSA infection nor to guide or monitor treatment for MRSA infections. Performed at [facility], [address] [place], [place] [address]   Culture, respiratory     Status: None   Collection Time: 06/13/19  3:28 PM   Specimen: Tracheal Aspirate  Result Value Ref Range Status   Specimen Description   Final    TRACHEAL ASPIRATE Performed at [facility], [address] [place], [place] [address]    Special Requests   Final    NONE Performed at [facility], [address] [place], [place] [address]    Gram Stain   Final    ABUNDANT WBC PRESENT, PREDOMINANTLY PMN ABUNDANT GRAM POSITIVE COCCI RARE GRAM NEGATIVE RODS Performed at [facility], [place] [address], [place], [place] [address]    Culture   Final    ABUNDANT KLEBSIELLA OXYTOCA ABUNDANT STENOTROPHOMONAS MALTOPHILIA    Report Status 06/17/2019 FINAL  Final   Organism ID, Bacteria KLEBSIELLA OXYTOCA  Final   Organism ID, Bacteria STENOTROPHOMONAS MALTOPHILIA  Final      Susceptibility   Klebsiella oxytoca - MIC*    AMPICILLIN >=32 RESISTANT Resistant     CEFAZOLIN >=64 RESISTANT Resistant     CEFEPIME <=1 SENSITIVE Sensitive     CEFTAZIDIME <=1 SENSITIVE Sensitive     CEFTRIAXONE <=1 SENSITIVE Sensitive     CIPROFLOXACIN <=0.25 SENSITIVE Sensitive     GENTAMICIN <=1 SENSITIVE Sensitive     IMIPENEM <=0.25 SENSITIVE Sensitive     TRIMETH/SULFA <=20 SENSITIVE Sensitive     AMPICILLIN/SULBACTAM 4 SENSITIVE Sensitive     PIP/TAZO <=4 SENSITIVE Sensitive     Extended ESBL NEGATIVE Sensitive     * ABUNDANT KLEBSIELLA OXYTOCA   Stenotrophomonas maltophilia - MIC*    LEVOFLOXACIN 0.25 SENSITIVE Sensitive     TRIMETH/SULFA <=20 SENSITIVE Sensitive     * ABUNDANT STENOTROPHOMONAS MALTOPHILIA  C difficile quick scan w  PCR reflex     Status: None   Collection Time: 06/19/19  8:52 AM   Specimen: STOOL  Result Value Ref Range Status   C Diff antigen NEGATIVE NEGATIVE Final   C Diff toxin NEGATIVE NEGATIVE Final   C Diff interpretation No C. difficile detected.  Final    Comment: Performed at [facility], [address] [place], [place] [address]    Best Practice/Protocols:  VTE Prophylaxis: Lovenox (prophylaxtic dose) GI Prophylaxis: Antihistamine Continous Sedation  Events: 9/24- Presented to ED with displaced right ankle fracture and limb ischemia 9/24- Vascular surgery to revascularize right lower extremity 9/25- Open reduction internal fixation of right ankle fracture medially and laterally 9/26- patient on precedex and multiple PRN IV meds still tachycardic with episodes of aggitation and combative/aggresive behavior. Concern for loss of airway protection 9/27- patient attempting to physically attack staff and loudly verbally  abusive, he is in high doses of IV sedation. After additional PRN IV medications patient temporarilly sedated with episodic apnea. Pt sedated and intubated. 9/28 failed SAT due to severe agitation and delirium 9/29PICC line placed 9/30 failed SAT again for severe agitation and delirium 9/30 CXR suspicious for right lower lobe pneumonia, began treatment with Unasyn 10/2 Pt failed spontaneous breathing trial with sedation 10/3 remains on the ventilator family and do so informed ICU MD with regards to seizure and psychiatric issues recently. 10/4 updated mother at bedside.  Instituted changes to sedatives. 10/6 Started SBT 10/6 changed Levaquin to Bactrim due to Stenotrophomonas  Studies: Dg Ankle 2 Views Right  Result Date: 06/07/2019 CLINICAL DATA:  Medial and lateral malleolar fracture subluxation status post reduction. EXAM: RIGHT ANKLE - 2 VIEW COMPARISON:  Right ankle x-rays from same day. FINDINGS: Improved alignment of the medial malleolar and  distal fibular fractures status post reduction. Residual 2 mm posterior displacement of the distal fibular fracture with 1.5 cm overriding. Slight residual widening of the medial clear space. Joint spaces are preserved. Bone mineralization is normal. Unchanged diffuse soft tissue swelling. IMPRESSION: Improved alignment status post reduction of the medial and lateral malleolar fractures. Electronically Signed   By: Obie Dredge M.D.   On: 06/07/2019 20:18   Dg Ankle Complete Right  Result Date: 06/08/2019 CLINICAL DATA:  Status post surgery to right ankle. EXAM: RIGHT ANKLE - COMPLETE 3+ VIEW COMPARISON:  None. FINDINGS: A plate has been placed across the distal fibular fracture. Two screws been placed through the medial malleolus. Skin staples are identified. IMPRESSION: Repair of right ankle fractures as above. Electronically Signed   By: Gerome Sam III M.D   On: 06/08/2019 17:37   Dg Ankle Complete Right  Result Date: 06/07/2019 CLINICAL DATA:  Per EMS report, patient slipped and fell in his kitchen and was witnessed by his wife. Patient has deformity to right ankle. Patient was given of Fentanyl en route by EMT. Patient denies relief of the pain. Patient arrives with right lower leg splinted. EXAM: RIGHT ANKLE - COMPLETE 3+ VIEW COMPARISON:  None. FINDINGS: There is a transverse fracture across the base of the medial malleolus and an oblique fracture of the distal fibula extending from the metadiaphysis to the distal metaphysis at the level of the ankle joint. The talus is subluxed posteriorly and laterally by approximately 5 mm. The distal fibular fracture is displaced posteriorly by 1.3 cm. There is surrounding soft tissue swelling. IMPRESSION: 1. Displaced fractures of the medial malleolus and distal fibula with subluxation of the talus as detailed. No dislocation. Electronically Signed   By: Amie Portland M.D.   On: 06/07/2019 18:41   Dg Abd 1 View  Result Date: 06/11/2019 CLINICAL  DATA:  Oral gastric tube placement EXAM: ABDOMEN - 1 VIEW COMPARISON:  06/10/2019 FINDINGS: Lung bases are clear. Esophageal tube tip overlies the proximal stomach, side-port at the cardia of the stomach. Large stool in the upper abdomen. Coils to the right of low L2. IMPRESSION: Esophageal tube tip overlies the proximal stomach. Electronically Signed   By: Jasmine Pang M.D.   On: 06/11/2019 23:27   Dg Abd 1 View  Result Date: 06/10/2019 CLINICAL DATA:  Evaluate NG tube EXAM: ABDOMEN - 1 VIEW COMPARISON:  None. FINDINGS: The NG tube is been reposition with the side port and distal tip now located in the stomach. IMPRESSION: The NG tube terminates in the stomach. Electronically Signed   By: Gerome Sam III  M.D   On: 06/10/2019 13:39   Dg Abd 1 View  Result Date: 06/10/2019 CLINICAL DATA:  Evaluate NG tube placement EXAM: ABDOMEN - 1 VIEW COMPARISON:  None. FINDINGS: The NG tube is located in the right lower lobe of the lung. A subsequent images already been taken after repositioning. IMPRESSION: The NG tube terminates in the right lower lobe of the lung. A subsequent images has already been obtained after repositioning. Electronically Signed   By: Gerome Samavid  Williams III M.D   On: 06/10/2019 13:39   Dg Chest Port 1 View  Result Date: 06/20/2019 CLINICAL DATA:  Acute respiratory failure. EXAM: PORTABLE CHEST 1 VIEW COMPARISON:  06/18/2019 FINDINGS: Endotracheal tube is in good position 4 cm above the carina. NG tube tip is below the diaphragm in the body of the stomach. PICC tip is at the cavoatrial junction. Has been slight progression of the hazy infiltrate at the right lung base. There is now a hazy infiltrate at the left lung base. Heart size and pulmonary vascularity are normal. No effusions. No acute bone abnormality. IMPRESSION: 1. Slight progression of the hazy infiltrate at the right lung base. 2. New hazy infiltrate at the left lung base. 3. Tubes and lines in good position. Electronically  Signed   By: Francene BoyersJames  Maxwell M.D.   On: 06/20/2019 07:44   Dg Chest Port 1 View  Result Date: 06/18/2019 CLINICAL DATA:  Respiratory failure EXAM: PORTABLE CHEST 1 VIEW COMPARISON:  Chest radiograph 06/15/2019 FINDINGS: ET tube mid trachea. Right upper extremity PICC line tip projects over the superior vena cava. Monitoring leads overlie the patient. Enteric tube courses inferior to the diaphragm. Stable cardiac and mediastinal contours. Similar patchy consolidation right mid lower lung. Minimal left basilar atelectasis. No pleural effusion. Thoracic spine degenerative changes. IMPRESSION: Similar-appearing right mid and lower lung patchy consolidation which may represent pneumonia in the appropriate clinical setting. Electronically Signed   By: Annia Beltrew  Davis M.D.   On: 06/18/2019 09:58   Dg Chest Port 1 View  Result Date: 06/15/2019 CLINICAL DATA:  Acute respiratory failure and hypoxia. EXAM: PORTABLE CHEST 1 VIEW COMPARISON:  Chest radiograph dated 06/13/2019. FINDINGS: An endotracheal tube terminates in the midthoracic trachea. An enteric tube enters the stomach and terminates below the field of view. A right upper extremity central venous catheter tip overlies the superior cavoatrial junction. Cervical spinal fusion hardware is seen. Bilateral interstitial and airspace opacities are not significantly changed since prior exam and are most significant in the right lower lung. There is no pleural effusion or pneumothorax. The cardiomediastinal silhouette is unchanged. IMPRESSION: Unchanged bilateral interstitial and airspace opacities which are most significant in the right lower lung. Electronically Signed   By: Romona Curlsyler  Litton M.D.   On: 06/15/2019 08:39   Dg Chest Port 1 View  Result Date: 06/13/2019 CLINICAL DATA:  Hypoxia, smoker, hypertension EXAM: PORTABLE CHEST 1 VIEW COMPARISON:  Portable exam 1021 hours compared to 06/12/2019 FINDINGS: Tip of endotracheal tube projects 4.0 cm above carina.  Nasogastric tube extends into stomach. RIGHT arm PICC line tip projects over superior RIGHT atrium. Normal heart size, mediastinal contours, and pulmonary vascularity. Developing opacity at RIGHT lower lobe question atelectasis versus infiltrate. Slight accentuation of interstitial markings bilaterally. No pleural effusion or pneumothorax. No acute osseous findings. IMPRESSION: Stable support lines/tubes. Developing atelectasis versus infiltrate at RIGHT base. Electronically Signed   By: Ulyses SouthwardMark  Boles M.D.   On: 06/13/2019 10:38   Dg Chest Port 1 View  Result Date: 06/12/2019 CLINICAL DATA:  PICC line placement EXAM: PORTABLE CHEST 1 VIEW COMPARISON:  One day prior FINDINGS: Cervical spine fixation. Endotracheal tube terminates 5.5 cm above carina. Nasogastric tube extends beyond the inferior aspect of the film. Right PICC line tip at low SVC or superior caval/atrial junction. Numerous leads and wires project over the chest. Normal heart size. No pleural effusion or pneumothorax. No lobar consolidation. IMPRESSION: Appropriate position of right-sided PICC line, without pneumothorax. Electronically Signed   By: Jeronimo Greaves M.D.   On: 06/12/2019 16:11   Dg Chest Port 1 View  Result Date: 06/11/2019 CLINICAL DATA:  Acute respiratory failure. EXAM: PORTABLE CHEST 1 VIEW COMPARISON:  06/10/2019. FINDINGS: Endotracheal tube and stable position. NG tube tip has now been repositioned and is now below left hemidiaphragm. Heart size normal. Low lung volumes. Mild left base atelectasis/infiltrate. No pleural effusion or pneumothorax. IMPRESSION: 1. NG tube tip is now been repositioned and is now below left hemidiaphragm. Endotracheal tube in stable position. 2.  Low lung volumes.  Mild left base atelectasis/infiltrate. Electronically Signed   By: Maisie Fus  Register   On: 06/11/2019 06:54   Dg Chest Port 1 View  Result Date: 06/10/2019 CLINICAL DATA:  Endotracheal tube placement. EXAM: PORTABLE CHEST 1 VIEW COMPARISON:   None. FINDINGS: The heart size and mediastinal contours are within normal limits. Endotracheal tube is in grossly good position. Nasogastric tube is seen in right lower lobe bronchus. No pneumothorax or pleural effusion is noted. Both lungs are clear. The visualized skeletal structures are unremarkable. IMPRESSION: Endotracheal tube in grossly good position. Nasogastric tube seen in right lower lobe bronchus; I spoke with the patient's nurse, Ladona Ridgel, who stated that they are aware of this finding. No other abnormality seen in the chest. Electronically Signed   By: Lupita Raider M.D.   On: 06/10/2019 13:40   [place] Ekg Site Rite  Result Date: 06/12/2019 If Site Rite image not attached, placement could not be confirmed due to current cardiac rhythm.   Consults: Treatment Team:  Deeann Saint, MD Cherly Beach, DO [facility], [facility], MD Jama Flavors, MD Schnier, Latina Craver, MD   Subjective:    Overnight Issues: Did fairly well last night.  Only few episodes of agitation.  No respiratory distress  Objective:  Vital signs for last 24 hours: Temp:  [98.3 F (36.8 C)-103.3 F (39.6 C)] 98.3 F (36.8 C) (10/07 0802) Pulse Rate:  [74-150] 114 (10/07 1200) BP: (66-166)/(45-99) 147/99 (10/07 1100) SpO2:  [91 %-100 %] 95 % (10/07 1200) FiO2 (%):  [30 %-74 %] 74 % (10/07 1253)  Hemodynamic parameters for last 24 hours:    Intake/Output from previous day: 10/06 0701 - 10/07 0700 In: 4211.2 [I.V.:988.2; NG/GT:1850; IV Piggyback:1373] Out: 6025 [Urine:5325; Emesis/NG output:200; Stool:500]  Intake/Output this shift: Total I/O In: -  Out: 950 [Urine:950]  Vent settings for last 24 hours: Vent Mode: PSV FiO2 (%):  [30 %-74 %] 74 % Set Rate:  [16 bmp] 16 bmp Vt Set:  [500 mL] 500 mL PEEP:  [5 cmH20] 5 cmH20 Pressure Support:  [0 cmH20] 0 cmH20 Plateau Pressure:  [15 cmH20-20 cmH20] 15 cmH20  Physical Exam:  GENERAL:critically ill appearing, intubated and sedated, bilateral  temporal wasting HEAD: Normocephalic, atraumatic.  EYES: Pupils equal, round, reactive to light.  No scleral icterus.  MOUTH: Orotracheally intubated.  Oral mucosa moist.  OG in place. NECK: Supple. No JVD.  No crepitus. PULMONARY: Coarse breath sounds bilaterally.  No wheezes or rhonchi noted. CARDIOVASCULAR: S1 and S2.  Tachycardic  regular rhythm. No murmurs, rubs, or gallops.  GASTROINTESTINAL: Soft, nontender, non-distended.Positive bowel sounds.  MUSCULOSKELETAL: No swelling, clubbing, or edema.  NEUROLOGIC: Heavily sedated, RASS -2 to, less agitated when sedation lightened. SKIN:intact,warm,dry    Assessment/Plan:   1.  Acute hypoxic respiratory failure: Initial requirement for intubation was extreme agitation and need for increasing sedatives.  Patient has underlying mental illness.  Course complicated by right lower lobe aspiration pneumonia Klebsiella/Stenotrophomonas.  Continue supportive care. Continue bronchodilators and pulmonary toilet.  During SBT today was restless however was more tractable.  Patient was tolerating SBT opted for extubation to see if this would help with patient's agitation.  Tolerated extubation but patient still with underlying delirium.  2.  Underlying history of schizophrenia, bipolar disorder and major depressive disorder with frequent psychotic episodes: This issue adds complexity to his management as is affecting his requirements for sedatives.  Continue Depacon for mood stabilization.  Switch to Thorazine IV as we will have to be n.p.o. postextubation.  On Precedex IV off of all other sedatives.    3.  Alcohol abuse with delirium tremens: This issue adds complexity to his management.  Increased thiamine to 500 mg dose x3 days (completing today) in the event he may be entering a Warnicke's encephalopathy phase.  Continue CIWA protocol, Ativan dosage and frequency adjusted.  4.  Encephalopathy/delirium: Multifactorial issues with prior psychotic episodes  due to #2 above and issues with delirium tremens due to 3 above.  In addition patient is critically ill and may be having also issues with ICU delirium.  Continue IV sedatives with Precedex and Dilaudid, transition off these IV infusions as tolerated.  Instituted Thorazine.  Continue Depacon  5.  Klebsiella/Stenotrophomonas pneumonia: Switched to Bactrim yest following Stenotrophomonas sens.  6.  Nutrition: P.O. post extubation, speech therapy eval once more alert  7.  ICU Best practice: Patient is on GI and and DVT prophylaxis as above.  Constipation protocol as indicated, hyperglycemia protocol as indicated.  8.  Transient hypotension: Patient's requirement of pressors has now resolved after extubation.  This was likely due to the sedatives required for later associated discomfort.  9.  Chronic pain: Resume Suboxone.  Discontinue Dilaudid.   Overall, patient is critically ill, prognosis is guarded. High risk for cardiac arrest and death.  Discussed with patient's significant other Lupita Leash) at bedside, she was fully apprised.  Lupita Leash is on the list of individuals who can receive information on the patient.    LOS: 13 days   Additional comments: Multidisciplinary rounds were performed with critical care team.  Critical Care Total Time*: 50 Minutes  C. Danice Goltz, MD [place] [facility] 06/20/2019  *Care during the described time interval was provided by me and/or other providers on the critical care team.  I have reviewed this patient's available data, including medical history, events of note, physical examination and test results as part of my evaluation.

## 2019-06-20 NOTE — Progress Notes (Signed)
[employer] [facility] [place] [facility]   PATIENT NAME: Thomas [name]    MR#:  [id number]  DATE OF BIRTH:  10/08/1965  SUBJECTIVE:  CHIEF COMPLAINT:   Chief Complaint  Patient presents with  . Fall   -Remains intubated, sedated on Precedex, Versed and Dilaudid drips.  Slowly weaning down sedation. -Patient has had prior history of tracheostomy it seems.  Tried weaning for 4 hours yesterday but had to be stopped due to tachycardia. -Sedated and resting this morning.  REVIEW OF SYSTEMS:  Review of Systems  Unable to perform ROS: Critical illness    DRUG ALLERGIES:   Allergies  Allergen Reactions  . Haldol [Haloperidol Lactate]     VITALS:  Blood pressure 106/72, pulse 74, temperature 98.3 F (36.8 C), temperature source Oral, resp. rate 16, height 6' 0.01" (1.829 m), weight 76.6 kg, SpO2 96 %.  PHYSICAL EXAMINATION:  Physical Exam  GENERAL:  53 y.o.-year-old patient lying in the bed with no acute distress.  EYES: Pupils equal, round, reactive to light and accommodation. No scleral icterus. Extraocular muscles intact.  HEENT: Head atraumatic, normocephalic. Oropharynx and nasopharynx clear.  NECK:  Supple, no jugular venous distention. No thyroid enlargement, no tenderness.  LUNGS: Normal breath sounds bilaterally, no wheezing, rales,rhonchi or crepitation. No use of accessory muscles of respiration.  Decreased bibasilar breath sounds CARDIOVASCULAR: S1, S2 normal. No murmurs, rubs, or gallops.  ABDOMEN: Soft, nontender, nondistended. Bowel sounds present. No organomegaly or mass.  EXTREMITIES: No  cyanosis, or clubbing.  Right leg and ankle in a cast.  Cool to touch toes, capillary refill is noted. NEUROLOGIC: Unable to assess neurologically as patient is sedated PSYCHIATRIC: The patient is sedated SKIN: No obvious rash, lesion, or ulcer.    LABORATORY PANEL:   CBC Recent Labs  Lab 06/20/19 0446  WBC 11.1*  HGB 11.3*  HCT 34.7*  PLT 289    ------------------------------------------------------------------------------------------------------------------  Chemistries  Recent Labs  Lab 06/18/19 0335  06/20/19 0446  NA 140   < > 139  K 3.9   < > 3.5  CL 103   < > 100  CO2 29   < > 29  GLUCOSE 154*   < > 120*  BUN 24*   < > 19  CREATININE 0.58*   < > 0.65  CALCIUM 8.8*   < > 8.5*  MG 2.0   < > 2.2  AST 49*  --   --   ALT 52*  --   --   ALKPHOS 86  --   --   BILITOT 0.5  --   --    < > = values in this interval not displayed.   ------------------------------------------------------------------------------------------------------------------  Cardiac Enzymes No results for input(s): TROPONINI in the last 168 hours. ------------------------------------------------------------------------------------------------------------------  RADIOLOGY:  Dg Chest Port 1 View  Result Date: 06/20/2019 CLINICAL DATA:  Acute respiratory failure. EXAM: PORTABLE CHEST 1 VIEW COMPARISON:  06/18/2019 FINDINGS: Endotracheal tube is in good position 4 cm above the carina. NG tube tip is below the diaphragm in the body of the stomach. PICC tip is at the cavoatrial junction. Has been slight progression of the hazy infiltrate at the right lung base. There is now a hazy infiltrate at the left lung base. Heart size and pulmonary vascularity are normal. No effusions. No acute bone abnormality. IMPRESSION: 1. Slight progression of the hazy infiltrate at the right lung base. 2. New hazy infiltrate at the left lung base. 3. Tubes and lines in good  position. Electronically Signed   By: Lorriane Shire M.D.   On: 06/20/2019 07:44    EKG:   Orders placed or performed during the hospital encounter of 06/07/19  . EKG 12-Lead  . EKG 12-Lead    ASSESSMENT AND PLAN:   JamesRhodesis a61 y.o.malewith a known history of polysubstance abuse including alcohol,schizoaffective bipolar type,seizures,hypertension and insomnia who initially presented on  06/07/2019 following an episode of a fall. Was evaluated in the emergency room and found to have right ankle fracture as well as right foot ischemia.Went into alcohol withdrawal and currently sedated on the vent  1.Acute hypoxic respiratory failure with acute encephalopathy with agitation  -Patient went into alcohol withdrawal requiring several sedatives and subsequently intubated for hypoxic respiratory failure and airway protection on 06/10/2019 -Remains sedated on the vent. -On minimal vent settings. -Being managed by ICU team--continue to wean as tolerated -During last hospitalization, he was hospitalized for 2 months for alcohol withdrawals. -Might need a tracheostomy at this point  2.Right lower extremity ischemia -status post revascularization with angioplasty and stent placement by vascular surgery in right superficial femoral artery, right external iliac artery. -Continue aspirin and Plavix  3.Right ankle fracture. Status post fall Patient status post open reduction with internal fixation of right ankle by orthopedic physician. Day 12 postop -Patient will need physical therapy once extubated -On Dilaudid drip for pain  4.History of schizoaffective bipolar type -Currently sedated on the vent. -Being followed by psychiatry service and managing psych meds.  Patient on Seroquel, Versed drip, Dilaudid drip, Precedex drip, recommended to taper as tolerated -Psychiatry following -Seroquel added at bedtime.  Also on Depakote 3 times daily  5.Alcohol abuse now with delirium tremens [Patient requiring sedation on the vent.  He is on Precedex drip and low-dose Versed drip [Continue thiamine,multivitamin and folic acid. - on a tapering dose of doxepin  6.   aspiration pneumonia-finished Unasyn.  7. DVT prophylaxis;Lovenox  Difficult to be weaned off vent due to significant withdrawals and behavior    All the records are reviewed and case discussed with Care  Management/Social Workerr. Management plans discussed with the patient, family and they are in agreement.  CODE STATUS: Full code  TOTAL TIME TAKING CARE OF THIS PATIENT: 34 minutes.   POSSIBLE D/C IN ? DAYS, DEPENDING ON CLINICAL CONDITION.   Gladstone Lighter M.D on 06/20/2019 at 11:01 AM  Between 7am to 6pm - Pager - [phone]  After 6pm go to [email or web address] - password [email or web address] [place] [facility]  Office  [phone]  CC: Primary care physician; Patient, [name]

## 2019-06-20 NOTE — Consult Note (Signed)
PHARMACY CONSULT NOTE - FOLLOW UP  Pharmacy Consult for Electrolyte Monitoring and Replacement   53 y/o M admitted with right lower extremity ischemia and right ankle fracture now s/p peripheral revascularization and ORIF. Patient has a history significant for schizophrenia and substance abuse. Post-operatively he became extremely agitated likely secondary to drug/alcohol withdrawal requiring high doses of sedatives. He is currently intubated and sedated in the ICU.   Recent Labs: Potassium (mmol/L)  Date Value  06/20/2019 3.5   Magnesium (mg/dL)  Date Value  06/20/2019 2.2   Calcium (mg/dL)  Date Value  06/20/2019 8.5 (L)   Albumin (g/dL)  Date Value  06/18/2019 2.8 (L)   Phosphorus (mg/dL)  Date Value  06/19/2019 3.9   Sodium (mmol/L)  Date Value  06/20/2019 139    Electrolytes: Patient has a history of alcohol abuse. Baseline nutritional status un-clear. On tube feeds. Goal of therapy to replete electrolytes to normal levels. Patient with good UOP. Regular bowel movements. Sodium stable.   -Potassium chloride 40 mEq VT x1   -Continue free water flushes 100 mL q4h   -BMP, Mg, Phos tomorrow AM  Constipation: Patient is having regular bowel movements. Constipating medications include hydromorphone, doxepin (tapering), cyproheptadine. Rectal tube was placed on 9/29. C diff panel 10/6 negative.  Glucose: No baseline A1c in chart and no history of diabetes recorded. Currently on tube feeds. Medications that could contribute to development of new-onset diabetes include quetiapine. Patient received methylprednisolone 60mg  IV Q6hr x 4 doses on 10/3-10/4. Blood glucoses elevated on AM BMP 10/4-10/5 days likely secondary to steroids. Glucoses slightly elevated with AM labs.     -Continue to monitor blood glucoses.   Pharmacy will continue to monitor and adjust per consult.   [place] Resident 06/20/2019 1:53 PM

## 2019-06-20 NOTE — Progress Notes (Signed)
[name] Daily Progress Note   Subjective: 12 Days Post-Op: 1. Introduction catheter intorightlower extremity 3rd order catheter placement  2.Contrast injectionrightlower extremity for distal runoff  3. Percutaneous transluminal angioplasty and stent placementrightsuperficial femoral artery  4. Percutaneous transluminal angioplasty and stent placement right external iliac artery 5.Star close closureleftcommon femoral arteriotomy  Intubated and somewhat sedated this AM  Objective: Vitals:   06/20/19 0600 06/20/19 0735 06/20/19 0800 06/20/19 0802  BP: 98/72  106/72   Pulse: 96  74   Resp:      Temp:    98.3 F (36.8 C)  TempSrc:    Oral  SpO2: 93% 98% 96%   Weight:      Height:        Intake/Output Summary (Last 24 hours) at 06/20/2019 1155 Last data filed at 06/20/2019 1147 Gross per 24 hour  Intake 4161.17 ml  Output 5450 ml  Net -1288.83 ml   Physical Exam: Intubated and sedated however slightly more alert this AM. NAD CV: RRR Pulmonary: CTA Bilaterally Abdomen: Soft, Nontender, Nondistended Left Groin: Access site - clean, dry and intact Vascular: Right Lower Extremity: Thigh soft.Unable to access pedal pulses due to cast however toes are warm, pinkwith a good capillary refill.   Laboratory: CBC    Component Value Date/Time   WBC 11.1 (H) 06/20/2019 0446   HGB 11.3 (L) 06/20/2019 0446   HCT 34.7 (L) 06/20/2019 0446   PLT 289 06/20/2019 0446   BMET    Component Value Date/Time   NA 139 06/20/2019 0446   K 3.5 06/20/2019 0446   CL 100 06/20/2019 0446   CO2 29 06/20/2019 0446   GLUCOSE 120 (H) 06/20/2019 0446   BUN 19 06/20/2019 0446   CREATININE 0.65 06/20/2019 0446   CALCIUM 8.5 (L) 06/20/2019 0446   GFRNONAA >60 06/20/2019 0446   GFRAA >60 06/20/2019 0446   Assessment/Planning: The patient is a 53 year old male who presented with right  ankle fracture and right lower extremity ischemias/plower extremity revascularization and open reduction internal fixation ankle fracture with severe alcohol withdrawal now in DTs requiring sedation and subsequent intubation 1) Appreciate assistance in management from ICU,psychiatryand medicine 2) Successful right lower extremity revascularization - no further recommendation from vascular at this time. Will continue to monitor 3) On ASA / Plavix   Discussed with Dr. Eber Hong  PA-C 06/20/2019 11:55 AM

## 2019-06-21 LAB — BASIC METABOLIC PANEL
Anion gap: 8 (ref 5–15)
BUN: 14 mg/dL (ref 6–20)
CO2: 25 mmol/L (ref 22–32)
Calcium: 8.3 mg/dL — ABNORMAL LOW (ref 8.9–10.3)
Chloride: 103 mmol/L (ref 98–111)
Creatinine, Ser: 0.68 mg/dL (ref 0.61–1.24)
GFR calc Af Amer: >60 mL/min (ref >60)
GFR calc non Af Amer: >60 mL/min (ref >60)
Glucose, Bld: 105 mg/dL — ABNORMAL HIGH (ref 70–99)
Potassium: 4.1 mmol/L (ref 3.5–5.1)
Sodium: 136 mmol/L (ref 135–145)

## 2019-06-21 LAB — CBC
HCT: 30.8 % — ABNORMAL LOW (ref 39.0–52.0)
Hemoglobin: 10.4 g/dL — ABNORMAL LOW (ref 13.0–17.0)
MCH: 33.8 pg (ref 26.0–34.0)
MCHC: 33.8 g/dL (ref 30.0–36.0)
MCV: 100 fL (ref 80.0–100.0)
Platelets: 306 10*3/uL (ref 150–400)
RBC: 3.08 MIL/uL — ABNORMAL LOW (ref 4.22–5.81)
RDW: 13.5 % (ref 11.5–15.5)
WBC: 14 10*3/uL — ABNORMAL HIGH (ref 4.0–10.5)
nRBC: 0 % (ref 0.0–0.2)

## 2019-06-21 LAB — PHOSPHORUS: Phosphorus: 3.6 mg/dL (ref 2.5–4.6)

## 2019-06-21 LAB — VALPROIC ACID LEVEL: Valproic Acid Lvl: 53 ug/mL (ref 50.0–100.0)

## 2019-06-21 LAB — MAGNESIUM: Magnesium: 1.9 mg/dL (ref 1.7–2.4)

## 2019-06-21 MED ORDER — NOREPINEPHRINE 16 MG/250ML-% IV SOLN
0.0000 ug/min | INTRAVENOUS | Status: DC
  Administered 2019-06-23: 4 ug/min via INTRAVENOUS
  Filled 2019-06-21 (×2): qty 250

## 2019-06-21 MED ORDER — SODIUM CHLORIDE 0.9 % IV SOLN
50.0000 mg | Freq: Once | INTRAVENOUS | Status: AC
  Administered 2019-06-21: 50 mg via INTRAVENOUS
  Filled 2019-06-21: qty 2

## 2019-06-21 MED ORDER — DEXTROSE IN LACTATED RINGERS 5 % IV SOLN
INTRAVENOUS | Status: DC
  Administered 2019-06-21 – 2019-06-26 (×8): via INTRAVENOUS

## 2019-06-21 MED ORDER — SODIUM CHLORIDE 0.9 % IV SOLN
100.0000 mg | Freq: Three times a day (TID) | INTRAVENOUS | Status: DC
  Administered 2019-06-21 – 2019-06-23 (×5): 100 mg via INTRAVENOUS
  Filled 2019-06-21 (×9): qty 4

## 2019-06-21 MED ORDER — LACTATED RINGERS IV BOLUS
250.0000 mL | Freq: Once | INTRAVENOUS | Status: AC
  Administered 2019-06-21: 20:00:00 250 mL via INTRAVENOUS

## 2019-06-21 NOTE — Progress Notes (Signed)
[facility] Daily Progress Note   Subjective: 13 Days Post-Op: 1. Introduction catheter intorightlower extremity 3rd order catheter placement  2.Contrast injectionrightlower extremity for distal runoff  3. Percutaneous transluminal angioplasty and stent placementrightsuperficial femoral artery  4. Percutaneous transluminal angioplasty and stent placement right external iliac artery 5.Star close closureleftcommon femoral arteriotomy  Patient extubated, still sedated with sitter at bedside.   Objective: Vitals:   06/21/19 0600 06/21/19 0740 06/21/19 0800 06/21/19 0900  BP: 104/71  (!) 88/59 90/63  Pulse: 80  66   Resp:      Temp:  97.7 F (36.5 C)    TempSrc:  Axillary    SpO2: 96%     Weight:      Height:        Intake/Output Summary (Last 24 hours) at 06/21/2019 0956 Last data filed at 06/21/2019 0600 Gross per 24 hour  Intake 3156 ml  Output 4600 ml  Net -1444 ml   Physical Exam: Extubated, sedated with sitter at bedside, NAD CV: RRR Pulmonary: CTA Bilaterally Abdomen: Soft, Nontender, Nondistended Left Groin: Access site - clean, dry and intact Vascular: Right Lower Extremity: Thigh soft.Unable to access pedal pulses due to cast however toes are warm, pinkwith a good capillary refill.   Laboratory: CBC    Component Value Date/Time   WBC 14.0 (H) 06/21/2019 0425   HGB 10.4 (L) 06/21/2019 0425   HCT 30.8 (L) 06/21/2019 0425   PLT 306 06/21/2019 0425   BMET    Component Value Date/Time   NA 136 06/21/2019 0425   K 4.1 06/21/2019 0425   CL 103 06/21/2019 0425   CO2 25 06/21/2019 0425   GLUCOSE 105 (H) 06/21/2019 0425   BUN 14 06/21/2019 0425   CREATININE 0.68 06/21/2019 0425   CALCIUM 8.3 (L) 06/21/2019 0425   GFRNONAA >60 06/21/2019 0425   GFRAA >60 06/21/2019 0425   Assessment/Planning: The patient is a 53 year old male who presented with  right ankle fracture and right lower extremity ischemias/plower extremity revascularization and open reduction internal fixation ankle fracture with severe alcohol withdrawal now in DTs requiring sedation and subsequent intubation 1) Appreciate assistance in management from ICU,psychiatryand medicine 2) Successful right lower extremity revascularization - no further recommendation from vascular at this time. Will continue to monitor 3) On ASA / Plavix 4) Now extubated. Being treated for pneumonia.   Seen and examined with Tamera Stands PA-C 06/21/2019 9:56 AM

## 2019-06-21 NOTE — Consult Note (Signed)
Pharmacy Antibiotic Note:  Thomas [name] is a 53 y.o. male with history of psychiatric disorders and polysubstance abuse admitted on 06/07/2019 with pneumonia. Patient presented initially for peripheral revascularization of ischemic limb and ORIF of right ankle fracture. Post-operatively he became combative toward staff and was subsequently intubated. He has required high doses of sedatives this admission and each attempt to wean has resulted in further agitation and combativeness. Patient has completed a course of Unasyn this admission due to c/f aspiration pneumonia. Both bacteria growing in tracheal aspirate are susceptible to Bactrim. Bactrim is the therapy of choice for Stenotrophomonas due to reliable in vitro activity. Pharmacy has been consulted for sulfamethoxazole-trimethoprim dosing.   (10/8): Patient has remained afebrile over past 24 hours.  He was extubated on 10/7, but remains agitated.  WBCs have been trending upward.  Plan: Sulfamethoxazole-trimethoprim 320 mg IV q8h (12.5 mg/kg trimethoprim component)  Today is day 3 of therapy.   Recommended duration of therapy is 7 days for stenotrophomonas pneumonia. Continue to monitor for adverse effects and for signs of clinical improvement.   Height: 6' 0.01" (182.9 cm) Weight: 168 lb 14 oz (76.6 kg) IBW/kg (Calculated) : 77.62  Temp (24hrs), Avg:98.5 F (36.9 C), Min:97.7 F (36.5 C), Max:98.9 F (37.2 C)  Recent Labs  Lab 06/16/19 0426 06/17/19 0424 06/18/19 0335 06/19/19 0505 06/20/19 0446 06/21/19 0425  WBC 5.5  --  9.5 8.6 11.1* 14.0*  CREATININE 0.64 0.50* 0.58* 0.63 0.65 0.68    Estimated Creatinine Clearance: 115.7 mL/min (by C-G formula based on SCr of 0.68 mg/dL).    Allergies  Allergen Reactions  . Haldol [Haloperidol Lactate]     Antimicrobials this admission: Unasyn 9/30 >> 10/3 Levofloxacin 10/3 x1  Bactrim 10/6 >>  Microbiology Results: 10/6 C diff: negative 9/30 Tracheal aspirate: Klebsiella  oxytoca, Stenotrophomonas maltophilia 9/25 MRSA PCR: negative 9/24 COVID 19: negative   Raiford Simmonds, PharmD Candidate 06/21/2019 2:51 PM

## 2019-06-21 NOTE — Consult Note (Signed)
Patient no longer intubated but remains sedated in ICU.  Per nursing report patient is less agitated today.  Psychiatry will continue to follow

## 2019-06-21 NOTE — Progress Notes (Signed)
Follow up - Critical Care Medicine Note  Patient Details:    Thomas [name] is an 53 y.o. male with multiple comorbidities to include bipolar disorder and schizophrenia, seizure disorder, major depressive disorder with psychotic episodes and alcohol abuse presented with right ankle pain found to have a fracture and also found to have critical limb ischemia of the right lower extremity.  Patient had to have stents placed for his limb ischemia, post operatively he was too combative and require intubation and sedation for management.   Lines, Airways, Drains: Airway 7.5 mm (Active)  Secured at (cm) 25 cm 06/18/19 0820  Measured From Lips 06/18/19 0820  Secured Location Right 06/18/19 0820  Secured By [employer] 06/18/19 0820  Tube Holder Repositioned Yes 06/18/19 0820  Cuff Pressure (cm H2O) 30 cm H2O 06/18/19 0820  Site Condition Dry 06/18/19 0820     PICC Double Lumen 06/12/19 PICC Right Brachial 44 cm 2 cm (Active)  Indication for Insertion or Continuance of Line Limited venous access - need for IV therapy >5 days (PICC only);Poor Vasculature-patient has had multiple peripheral attempts or PIVs lasting less than 24 hours 06/18/19 0400  Exposed Catheter (cm) 2 cm 06/12/19 1530  Site Assessment Clean;Dry;Intact 06/18/19 0400  Lumen #1 Status Infusing;Flushed 06/18/19 0400  Lumen #2 Status Infusing;Flushed 06/18/19 0400  Dressing Type Transparent;Occlusive 06/18/19 0400  Dressing Status Clean;Intact;Antimicrobial disc in place;Dry 06/18/19 0400  Line Care Lumen 1 tubing changed;Lumen 2 tubing changed;Connections checked and tightened 06/16/19 1000  Dressing Intervention Other (Comment) 06/14/19 0800  Dressing Change Due 06/19/19 06/17/19 1000     NG/OG Tube Orogastric 18 Fr. Center mouth Xray Documented cm marking at nare/ corner of mouth 60 cm (Active)  Cm Marking at Nare/Corner of Mouth (if applicable) 60 cm 06/18/19 0400  External Length of Tube (cm) - (if applicable) 60  cm 06/15/19 1957  Site Assessment Clean;Intact;Dry 06/18/19 0400  Ongoing Placement Verification No acute changes, not attributed to clinical condition;No change in respiratory status;No change in cm markings or external length of tube from initial placement 06/18/19 0400  Status Infusing tube feed 06/18/19 0400  Drainage Appearance None 06/17/19 1608  Intake (mL) 0 mL 06/17/19 2000  Output (mL) 0 mL 06/18/19 0400     Rectal Tube/Pouch (Active)  Output (mL) 100 mL 06/18/19 0400  Intake (mL) 30 mL 06/17/19 2000     Urethral Catheter [facility], RN (Active)  Indication for Insertion or Continuance of Catheter Unstable critically ill patients first 24-48 hours (See Criteria);Therapy based on hourly urine output monitoring and documentation for critical condition (NOT STRICT I&O) 06/18/19 0400  Site Assessment Intact 06/18/19 0400  Catheter Maintenance Seal intact;No dependent loops;Insertion date on drainage bag;Drainage bag/tubing not touching floor;Bag below level of bladder;Catheter secured 06/18/19 0400  Collection Container Standard drainage bag 06/18/19 0400  Securement Method Leg strap 06/18/19 0400  Urinary Catheter Interventions (if applicable) Unclamped 06/18/19 0400  Input (mL) 0 mL 06/17/19 2000  Output (mL) 200 mL 06/18/19 0435    Anti-infectives:  Anti-infectives (From admission, onward)   Start     Dose/Rate Route Frequency Ordered Stop   06/19/19 1130  sulfamethoxazole-trimethoprim (BACTRIM) 320 mg in dextrose 5 % 500 mL IVPB     320 mg 346.7 mL/hr over 90 Minutes Intravenous Every 8 hours 06/19/19 1127     06/16/19 1000  levofloxacin (LEVAQUIN) IVPB 750 mg     750 mg 100 mL/hr over 90 Minutes Intravenous Every 24 hours 06/16/19 0906 06/16/19 1955  06/13/19 1600  Ampicillin-Sulbactam (UNASYN) 3 g in sodium chloride 0.9 % 100 mL IVPB  Status:  Discontinued     3 g 200 mL/hr over 30 Minutes Intravenous Every 6 hours 06/13/19 1444 06/16/19 0906   06/08/19 1800   ceFAZolin (ANCEF) IVPB 2g/100 mL premix     2 g 200 mL/hr over 30 Minutes Intravenous Every 8 hours 06/08/19 1730 06/09/19 1900   06/08/19 1800  clindamycin (CLEOCIN) IVPB 600 mg     600 mg 100 mL/hr over 30 Minutes Intravenous Every 8 hours 06/08/19 1730 06/09/19 2159   06/08/19 0000  ceFAZolin (ANCEF) IVPB 1 g/50 mL premix  Status:  Discontinued    Note to Pharmacy: Send with pt to OR   1 g 100 mL/hr over 30 Minutes Intravenous On call 06/07/19 2030 06/07/19 2251      Microbiology: Results for orders placed or performed during the hospital encounter of 06/07/19  SARS Coronavirus 2 [facility] order, Performed in [facility] hospital lab) Nasopharyngeal Nasopharyngeal Swab     Status: None   Collection Time: 06/07/19  6:34 PM   Specimen: Nasopharyngeal Swab  Result Value Ref Range Status   SARS Coronavirus 2 NEGATIVE NEGATIVE Final    Comment: (NOTE) If result is NEGATIVE SARS-CoV-2 target nucleic acids are NOT DETECTED. The SARS-CoV-2 RNA is generally detectable in upper and lower  respiratory specimens during the acute phase of infection. The lowest  concentration of SARS-CoV-2 viral copies this assay can detect is 250  copies / mL. A negative result does not preclude SARS-CoV-2 infection  and should not be used as the sole basis for treatment or other  patient management decisions.  A negative result may occur with  improper specimen collection / handling, submission of specimen other  than nasopharyngeal swab, presence of viral mutation(s) within the  areas targeted by this assay, and inadequate number of viral copies  (<250 copies / mL). A negative result must be combined with clinical  observations, patient history, and epidemiological information. If result is POSITIVE SARS-CoV-2 target nucleic acids are DETECTED. The SARS-CoV-2 RNA is generally detectable in upper and lower  respiratory specimens dur ing the acute phase of infection.  Positive  results are indicative of  active infection with SARS-CoV-2.  Clinical  correlation with patient history and other diagnostic information is  necessary to determine patient infection status.  Positive results do  not rule out bacterial infection or co-infection with other viruses. If result is PRESUMPTIVE POSTIVE SARS-CoV-2 nucleic acids MAY BE PRESENT.   A presumptive positive result was obtained on the submitted specimen  and confirmed on repeat testing.  While 2019 novel coronavirus  (SARS-CoV-2) nucleic acids may be present in the submitted sample  additional confirmatory testing may be necessary for epidemiological  and / or clinical management purposes  to differentiate between  SARS-CoV-2 and other Sarbecovirus currently known to infect humans.  If clinically indicated additional testing with an alternate test  methodology [id number]) is advised. The SARS-CoV-2 RNA is generally  detectable in upper and lower respiratory sp ecimens during the acute  phase of infection. The expected result is Negative. Fact Sheet for Patients:  BoilerBrush.com.cy Fact Sheet for Healthcare Providers: https://pope.com/ This test is not yet approved or cleared by the [place] FDA and has been authorized for detection and/or diagnosis of SARS-CoV-2 by FDA under an Emergency Use Authorization (EUA).  This EUA will remain in effect (meaning this test can be used) for the duration of the COVID-19 declaration  under Section 564(b)(1) of the Act, 21 U.S.C. section 360bbb-3(b)(1), unless the authorization is terminated or revoked sooner. Performed at [facility], [address] [place], [place] [address]   MRSA PCR Screening     Status: None   Collection Time: 06/08/19  5:53 AM   Specimen: Nasal Mucosa; Nasopharyngeal  Result Value Ref Range Status   MRSA by PCR NEGATIVE NEGATIVE Final    Comment:        The GeneXpert MRSA Assay (FDA approved for NASAL  specimens only), is one component of a comprehensive MRSA colonization surveillance program. It is not intended to diagnose MRSA infection nor to guide or monitor treatment for MRSA infections. Performed at [facility], [address] [place], [place] [address]   Culture, respiratory     Status: None   Collection Time: 06/13/19  3:28 PM   Specimen: Tracheal Aspirate  Result Value Ref Range Status   Specimen Description   Final    TRACHEAL ASPIRATE Performed at [facility], [address], [place], [place] [address]    Special Requests   Final    NONE Performed at [facility], [address] [place], [place] [address]    Gram Stain   Final    ABUNDANT WBC PRESENT, PREDOMINANTLY PMN ABUNDANT GRAM POSITIVE COCCI RARE GRAM NEGATIVE RODS Performed at [facility], [address] [place], [place] [address]    Culture   Final    ABUNDANT KLEBSIELLA OXYTOCA ABUNDANT STENOTROPHOMONAS MALTOPHILIA    Report Status 06/17/2019 FINAL  Final   Organism ID, Bacteria KLEBSIELLA OXYTOCA  Final   Organism ID, Bacteria STENOTROPHOMONAS MALTOPHILIA  Final      Susceptibility   Klebsiella oxytoca - MIC*    AMPICILLIN >=32 RESISTANT Resistant     CEFAZOLIN >=64 RESISTANT Resistant     CEFEPIME <=1 SENSITIVE Sensitive     CEFTAZIDIME <=1 SENSITIVE Sensitive     CEFTRIAXONE <=1 SENSITIVE Sensitive     CIPROFLOXACIN <=0.25 SENSITIVE Sensitive     GENTAMICIN <=1 SENSITIVE Sensitive     IMIPENEM <=0.25 SENSITIVE Sensitive     TRIMETH/SULFA <=20 SENSITIVE Sensitive     AMPICILLIN/SULBACTAM 4 SENSITIVE Sensitive     PIP/TAZO <=4 SENSITIVE Sensitive     Extended ESBL NEGATIVE Sensitive     * ABUNDANT KLEBSIELLA OXYTOCA   Stenotrophomonas maltophilia - MIC*    LEVOFLOXACIN 0.25 SENSITIVE Sensitive     TRIMETH/SULFA <=20 SENSITIVE Sensitive     * ABUNDANT STENOTROPHOMONAS MALTOPHILIA  C difficile quick scan w PCR reflex     Status: None   Collection  Time: 06/19/19  8:52 AM   Specimen: STOOL  Result Value Ref Range Status   C Diff antigen NEGATIVE NEGATIVE Final   C Diff toxin NEGATIVE NEGATIVE Final   C Diff interpretation No C. difficile detected.  Final    Comment: Performed at [facility], [address] [place], [place] [address]    Best Practice/Protocols:  VTE Prophylaxis: Lovenox (prophylaxtic dose) GI Prophylaxis: Antihistamine Continous Sedation  Events: 9/24- Presented to ED with displaced right ankle fracture and limb ischemia 9/24- Vascular surgery to revascularize right lower extremity 9/25- Open reduction internal fixation of right ankle fracture medially and laterally 9/26- patient on precedex and multiple PRN IV meds still tachycardic with episodes of aggitation and combative/aggresive behavior. Concern for loss of airway protection 9/27- patient attempting to physically attack staff and loudly verbally abusive, he is in high doses of IV sedation.  After additional PRN IV medications patient temporarilly sedated with episodic apnea. Pt sedated and intubated. 9/28 failed SAT due to severe agitation and delirium 9/29PICC line placed 9/30 failed SAT again for severe agitation and delirium 9/30 CXR suspicious for right lower lobe pneumonia, began treatment with Unasyn 10/2 Pt failed spontaneous breathing trial with sedation 10/3 remains on the ventilator family and do so informed ICU MD with regards to seizure and psychiatric issues recently. 10/4 updated mother at bedside.  Instituted changes to sedatives. 10/6 Started SBT 10/6 changed Levaquin to Bactrim due to Stenotrophomonas 10/7 extubated  Studies: Dg Ankle 2 Views Right  Result Date: 06/07/2019 CLINICAL DATA:  Medial and lateral malleolar fracture subluxation status post reduction. EXAM: RIGHT ANKLE - 2 VIEW COMPARISON:  Right ankle x-rays from same day. FINDINGS: Improved alignment of the medial malleolar and distal fibular fractures  status post reduction. Residual 2 mm posterior displacement of the distal fibular fracture with 1.5 cm overriding. Slight residual widening of the medial clear space. Joint spaces are preserved. Bone mineralization is normal. Unchanged diffuse soft tissue swelling. IMPRESSION: Improved alignment status post reduction of the medial and lateral malleolar fractures. Electronically Signed   By: Titus Dubin M.D.   On: 06/07/2019 20:18   Dg Ankle Complete Right  Result Date: 06/08/2019 CLINICAL DATA:  Status post surgery to right ankle. EXAM: RIGHT ANKLE - COMPLETE 3+ VIEW COMPARISON:  None. FINDINGS: A plate has been placed across the distal fibular fracture. Two screws been placed through the medial malleolus. Skin staples are identified. IMPRESSION: Repair of right ankle fractures as above. Electronically Signed   By: Dorise Bullion III M.D   On: 06/08/2019 17:37   Dg Ankle Complete Right  Result Date: 06/07/2019 CLINICAL DATA:  Per EMS report, patient slipped and fell in his kitchen and was witnessed by his wife. Patient has deformity to right ankle. Patient was given 15mcg of Fentanyl en route by EMT. Patient denies relief of the pain. Patient arrives with right lower leg splinted. EXAM: RIGHT ANKLE - COMPLETE 3+ VIEW COMPARISON:  None. FINDINGS: There is a transverse fracture across the base of the medial malleolus and an oblique fracture of the distal fibula extending from the metadiaphysis to the distal metaphysis at the level of the ankle joint. The talus is subluxed posteriorly and laterally by approximately 5 mm. The distal fibular fracture is displaced posteriorly by 1.3 cm. There is surrounding soft tissue swelling. IMPRESSION: 1. Displaced fractures of the medial malleolus and distal fibula with subluxation of the talus as detailed. No dislocation. Electronically Signed   By: Lajean Manes M.D.   On: 06/07/2019 18:41   Dg Abd 1 View  Result Date: 06/11/2019 CLINICAL DATA:  Oral gastric tube  placement EXAM: ABDOMEN - 1 VIEW COMPARISON:  06/10/2019 FINDINGS: Lung bases are clear. Esophageal tube tip overlies the proximal stomach, side-port at the cardia of the stomach. Large stool in the upper abdomen. Coils to the right of low L2. IMPRESSION: Esophageal tube tip overlies the proximal stomach. Electronically Signed   By: Donavan Foil M.D.   On: 06/11/2019 23:27   Dg Abd 1 View  Result Date: 06/10/2019 CLINICAL DATA:  Evaluate NG tube EXAM: ABDOMEN - 1 VIEW COMPARISON:  None. FINDINGS: The NG tube is been reposition with the side port and distal tip now located in the stomach. IMPRESSION: The NG tube terminates in the stomach. Electronically Signed   By: Dorise Bullion III M.D   On: 06/10/2019 13:39  Dg Abd 1 View  Result Date: 06/10/2019 CLINICAL DATA:  Evaluate NG tube placement EXAM: ABDOMEN - 1 VIEW COMPARISON:  None. FINDINGS: The NG tube is located in the right lower lobe of the lung. A subsequent images already been taken after repositioning. IMPRESSION: The NG tube terminates in the right lower lobe of the lung. A subsequent images has already been obtained after repositioning. Electronically Signed   By: Gerome Sam III M.D   On: 06/10/2019 13:39   Dg Chest Port 1 View  Result Date: 06/20/2019 CLINICAL DATA:  Acute respiratory failure. EXAM: PORTABLE CHEST 1 VIEW COMPARISON:  06/18/2019 FINDINGS: Endotracheal tube is in good position 4 cm above the carina. NG tube tip is below the diaphragm in the body of the stomach. PICC tip is at the cavoatrial junction. Has been slight progression of the hazy infiltrate at the right lung base. There is now a hazy infiltrate at the left lung base. Heart size and pulmonary vascularity are normal. No effusions. No acute bone abnormality. IMPRESSION: 1. Slight progression of the hazy infiltrate at the right lung base. 2. New hazy infiltrate at the left lung base. 3. Tubes and lines in good position. Electronically Signed   By: Francene Boyers  M.D.   On: 06/20/2019 07:44   Dg Chest Port 1 View  Result Date: 06/18/2019 CLINICAL DATA:  Respiratory failure EXAM: PORTABLE CHEST 1 VIEW COMPARISON:  Chest radiograph 06/15/2019 FINDINGS: ET tube mid trachea. Right upper extremity PICC line tip projects over the superior vena cava. Monitoring leads overlie the patient. Enteric tube courses inferior to the diaphragm. Stable cardiac and mediastinal contours. Similar patchy consolidation right mid lower lung. Minimal left basilar atelectasis. No pleural effusion. Thoracic spine degenerative changes. IMPRESSION: Similar-appearing right mid and lower lung patchy consolidation which may represent pneumonia in the appropriate clinical setting. Electronically Signed   By: Annia Belt M.D.   On: 06/18/2019 09:58   Dg Chest Port 1 View  Result Date: 06/15/2019 CLINICAL DATA:  Acute respiratory failure and hypoxia. EXAM: PORTABLE CHEST 1 VIEW COMPARISON:  Chest radiograph dated 06/13/2019. FINDINGS: An endotracheal tube terminates in the midthoracic trachea. An enteric tube enters the stomach and terminates below the field of view. A right upper extremity central venous catheter tip overlies the superior cavoatrial junction. Cervical spinal fusion hardware is seen. Bilateral interstitial and airspace opacities are not significantly changed since prior exam and are most significant in the right lower lung. There is no pleural effusion or pneumothorax. The cardiomediastinal silhouette is unchanged. IMPRESSION: Unchanged bilateral interstitial and airspace opacities which are most significant in the right lower lung. Electronically Signed   By: Romona Curls M.D.   On: 06/15/2019 08:39   Dg Chest Port 1 View  Result Date: 06/13/2019 CLINICAL DATA:  Hypoxia, smoker, hypertension EXAM: PORTABLE CHEST 1 VIEW COMPARISON:  Portable exam 1021 hours compared to 06/12/2019 FINDINGS: Tip of endotracheal tube projects 4.0 cm above carina. Nasogastric tube extends into  stomach. RIGHT arm PICC line tip projects over superior RIGHT atrium. Normal heart size, mediastinal contours, and pulmonary vascularity. Developing opacity at RIGHT lower lobe question atelectasis versus infiltrate. Slight accentuation of interstitial markings bilaterally. No pleural effusion or pneumothorax. No acute osseous findings. IMPRESSION: Stable support lines/tubes. Developing atelectasis versus infiltrate at RIGHT base. Electronically Signed   By: Ulyses Southward M.D.   On: 06/13/2019 10:38   Dg Chest Port 1 View  Result Date: 06/12/2019 CLINICAL DATA:  PICC line placement EXAM: PORTABLE CHEST 1  VIEW COMPARISON:  One day prior FINDINGS: Cervical spine fixation. Endotracheal tube terminates 5.5 cm above carina. Nasogastric tube extends beyond the inferior aspect of the film. Right PICC line tip at low SVC or superior caval/atrial junction. Numerous leads and wires project over the chest. Normal heart size. No pleural effusion or pneumothorax. No lobar consolidation. IMPRESSION: Appropriate position of right-sided PICC line, without pneumothorax. Electronically Signed   By: Jeronimo Greaves M.D.   On: 06/12/2019 16:11   Dg Chest Port 1 View  Result Date: 06/11/2019 CLINICAL DATA:  Acute respiratory failure. EXAM: PORTABLE CHEST 1 VIEW COMPARISON:  06/10/2019. FINDINGS: Endotracheal tube and stable position. NG tube tip has now been repositioned and is now below left hemidiaphragm. Heart size normal. Low lung volumes. Mild left base atelectasis/infiltrate. No pleural effusion or pneumothorax. IMPRESSION: 1. NG tube tip is now been repositioned and is now below left hemidiaphragm. Endotracheal tube in stable position. 2.  Low lung volumes.  Mild left base atelectasis/infiltrate. Electronically Signed   By: Maisie Fus  Register   On: 06/11/2019 06:54   Dg Chest Port 1 View  Result Date: 06/10/2019 CLINICAL DATA:  Endotracheal tube placement. EXAM: PORTABLE CHEST 1 VIEW COMPARISON:  None. FINDINGS: The heart  size and mediastinal contours are within normal limits. Endotracheal tube is in grossly good position. Nasogastric tube is seen in right lower lobe bronchus. No pneumothorax or pleural effusion is noted. Both lungs are clear. The visualized skeletal structures are unremarkable. IMPRESSION: Endotracheal tube in grossly good position. Nasogastric tube seen in right lower lobe bronchus; I spoke with the patient's nurse, Ladona Ridgel, who stated that they are aware of this finding. No other abnormality seen in the chest. Electronically Signed   By: Lupita Raider M.D.   On: 06/10/2019 13:40   [place] Ekg Site Rite  Result Date: 06/12/2019 If Site Rite image not attached, placement could not be confirmed due to current cardiac rhythm.   Consults: Treatment Team:  Deeann Saint, MD Cherly Beach, DO [facility], [facility], MD Jama Flavors, MD Schnier, Latina Craver, MD   Subjective:    Overnight Issues: Did fairly well last night.  Only few episodes of agitation.  No respiratory distress. Continues with intermittent hallucinations.  Objective:  Vital signs for last 24 hours: Temp:  [97.7 F (36.5 C)-98.9 F (37.2 C)] 97.7 F (36.5 C) (10/08 0740) Pulse Rate:  [65-115] 70 (10/08 1400) BP: (81-134)/(59-109) 91/65 (10/08 1400) SpO2:  [94 %-98 %] 96 % (10/08 1400) FiO2 (%):  [65 %-70 %] 70 % (10/08 0740)  Hemodynamic parameters for last 24 hours:    Intake/Output from previous day: 10/07 0701 - 10/08 0700 In: 3156 [I.V.:737.5; NG/GT:1057.2; IV Piggyback:1361.3] Out: 4600 [Urine:4400; Stool:200]  Intake/Output this shift: Total I/O In: -  Out: 1000 [Urine:1000]  Vent settings for last 24 hours: FiO2 (%):  [65 %-70 %] 70 %  Physical Exam:  GENERAL: Chronically ill appearing, bilateral temporal wasting, no respiratory distress HEAD: Normocephalic, atraumatic.  EYES: Pupils equal, round, reactive to light.  No scleral icterus.  MOUTH: Oral mucosa dry, poor dentition. NECK: Supple. No JVD.   No crepitus. PULMONARY: Coarse breath sounds bilaterally.  No wheezes or rhonchi noted. CARDIOVASCULAR: S1 and S2.  Regular rate and rhythm. No murmurs, rubs, or gallops.  GASTROINTESTINAL: Soft, nontender, non-distended.Positive bowel sounds.  MUSCULOSKELETAL: No swelling, clubbing, or edema.  NEUROLOGIC: Still requiring sedation with Ativan intermittently.  No respiratory embarrassment.  Good airway protection.  Hallucinating still but able to be reoriented.  SKIN:intact,warm,dry    Assessment/Plan:   1.  Acute hypoxic respiratory failure: Initial requirement for intubation was extreme agitation and need for increasing sedatives.  Patient has underlying mental illness.  Course complicated by right lower lobe aspiration pneumonia Klebsiella/Stenotrophomonas.  Was extubated yesterday with no sequela as of today.  On high flow supplemental oxygen, wean off as tolerated for saturations of 92% or better.  2.  Underlying history of schizophrenia, bipolar disorder and major depressive disorder with frequent psychotic episodes: This issue adds complexity to his management as is affecting his requirements for sedatives.  Continue Depacon for mood stabilization.  On Thorazine IV he is n.p.o. postextubation.  On Precedex IV off of all other sedatives.  Weaning off Precedex.  Thorazine adjusted today.  3.  Alcohol abuse with delirium tremens: This issue adds complexity to his management.  Completed thiamine to 500 mg dose x3 days now back to 100 mg/day.Continue CIWA protocol, Ativan dosage and frequency adjusted.  4.  Encephalopathy/delirium: Multifactorial issues with prior psychotic episodes due to #2 above and issues with delirium tremens due to 3 above.  In addition patient is critically ill and may be having also issues with ICU delirium.  Continue IV sedatives with Precedex and Dilaudid, transition off these IV infusions as tolerated.  Instituted Thorazine.  Continue Depacon  5.   Klebsiella/Stenotrophomonas pneumonia: Switched to Bactrim following Stenotrophomonas sens.  Continue Bactrim.  Will need a total of 10 days therapy.  6.  Nutrition: P.O. post extubation, speech therapy eval once more alert  7.  ICU Best practice: Patient is on GI and and DVT prophylaxis as above.  Constipation protocol as indicated, hyperglycemia protocol as indicated.  8.  Transient hypotension: Patient's requirement of pressors resolved after extubation.  This was likely due to the sedatives required for later associated discomfort.  9.  Chronic pain: Resumed Suboxone.  Discontinued Dilaudid.   Overall, patient is critically ill, prognosis is guarded. High risk for cardiac arrest and death.  Discussed with patient's significant other Lupita Leash(Donna) at bedside, she was fully apprised.  Lupita LeashDonna is on the list of individuals who can receive information on the patient.    LOS: 14 days   Additional comments: Multidisciplinary rounds were performed with critical care team.  Critical Care Total Time*:   C. Danice GoltzLaura Tanae Petrosky, MD [place] [facility] 06/21/2019  *Care during the described time interval was provided by me and/or other providers on the critical care team.  I have reviewed this patient's available data, including medical history, events of note, physical examination and test results as part of my evaluation.

## 2019-06-21 NOTE — Progress Notes (Signed)
[employer] - [place] at [facility]   PATIENT NAME: Thomas [name]    MR#:  [id number]  DATE OF BIRTH:  [birth year]  SUBJECTIVE:  CHIEF COMPLAINT:   Chief Complaint  Patient presents with  . Fall   - patient extubated, -still agitated,  on Precedex drip this morning - sitter at bedside  REVIEW OF SYSTEMS:  Review of Systems  Unable to perform ROS: Critical illness    DRUG ALLERGIES:   Allergies  Allergen Reactions  . Haldol [Haloperidol Lactate]     VITALS:  Blood pressure 90/63, pulse 66, temperature 97.7 F (36.5 C), temperature source Axillary, resp. rate 16, height 6' 0.01" (1.829 m), weight 76.6 kg, SpO2 96 %.  PHYSICAL EXAMINATION:  Physical Exam  GENERAL:  53 y.o.-year-old patient lying in the bed with no acute distress.  EYES: Pupils equal, round, reactive to light and accommodation. No scleral icterus. Extraocular muscles intact.  HEENT: Head atraumatic, normocephalic. Oropharynx and nasopharynx clear.  NECK:  Supple, no jugular venous distention. No thyroid enlargement, no tenderness.  LUNGS: Normal breath sounds bilaterally, no wheezing, rales,rhonchi or crepitation. No use of accessory muscles of respiration.  Decreased bibasilar breath sounds CARDIOVASCULAR: S1, S2 normal. No murmurs, rubs, or gallops.  ABDOMEN: Soft, nontender, nondistended. Bowel sounds present. No organomegaly or mass.  EXTREMITIES: No  cyanosis, or clubbing.  Right leg and ankle in a cast.  Cool to touch toes, capillary refill is noted. NEUROLOGIC: sedated and restless in bed PSYCHIATRIC: The patient is sedated SKIN: No obvious rash, lesion, or ulcer.    LABORATORY PANEL:   CBC Recent Labs  Lab 06/21/19 0425  WBC 14.0*  HGB 10.4*  HCT 30.8*  PLT 306   ------------------------------------------------------------------------------------------------------------------  Chemistries  Recent Labs  Lab 06/18/19 0335  06/21/19 0425  NA 140   < > 136  K 3.9    < > 4.1  CL 103   < > 103  CO2 29   < > 25  GLUCOSE 154*   < > 105*  BUN 24*   < > 14  CREATININE 0.58*   < > 0.68  CALCIUM 8.8*   < > 8.3*  MG 2.0   < > 1.9  AST 49*  --   --   ALT 52*  --   --   ALKPHOS 86  --   --   BILITOT 0.5  --   --    < > = values in this interval not displayed.   ------------------------------------------------------------------------------------------------------------------  Cardiac Enzymes No results for input(s): TROPONINI in the last 168 hours. ------------------------------------------------------------------------------------------------------------------  RADIOLOGY:  Dg Chest Port 1 View  Result Date: 06/20/2019 CLINICAL DATA:  Acute respiratory failure. EXAM: PORTABLE CHEST 1 VIEW COMPARISON:  06/18/2019 FINDINGS: Endotracheal tube is in good position 4 cm above the carina. NG tube tip is below the diaphragm in the body of the stomach. PICC tip is at the cavoatrial junction. Has been slight progression of the hazy infiltrate at the right lung base. There is now a hazy infiltrate at the left lung base. Heart size and pulmonary vascularity are normal. No effusions. No acute bone abnormality. IMPRESSION: 1. Slight progression of the hazy infiltrate at the right lung base. 2. New hazy infiltrate at the left lung base. 3. Tubes and lines in good position. Electronically Signed   By: Francene Boyers M.D.   On: 06/20/2019 07:44    EKG:   Orders placed or performed during the hospital encounter of 06/07/19  .  EKG 12-Lead  . EKG 12-Lead    ASSESSMENT AND PLAN:   JamesRhodesis a53 y.o.malewith a known history of polysubstance abuse including alcohol,schizoaffective bipolar type,seizures,hypertension and insomnia who initially presented on 06/07/2019 following an episode of a fall. Was evaluated in the emergency room and found to have right ankle fracture as well as right foot ischemia.Went into alcohol withdrawal and currently sedated on the vent   1.Acute hypoxic respiratory failure with acute encephalopathy with agitation  -Patient went into alcohol withdrawal requiring several sedatives and subsequently intubated for hypoxic respiratory failure and airway protection on 06/10/2019 -Patient is extubated on 06/20/2019.  Remains on Precedex drip.  Has a sitter at  bedside as very agitated  2.Right lower extremity ischemia -status post revascularization with angioplasty and stent placement by vascular surgery in right superficial femoral artery, right external iliac artery. -Continue aspirin and Plavix  3.Right ankle fracture. Status post fall Patient status post open reduction with internal fixation of right ankle by orthopedic physician. Day 13 postop -Patient will need physical therapy -Continue pain medications.  On Suboxone  4.History of schizoaffective bipolar type -Currently sedated on the vent. -Being followed by psychiatry service and managing psych meds.  Patient on Seroquel, Versed drip, Dilaudid drip, Precedex drip, recommended to taper as tolerated -Psychiatry following -Seroquel added at bedtime.  Also on Depakote 3 times daily  5.Alcohol abuse now with delirium tremens Patient requiring sedation on the vent.  He is on Precedex drip Continue thiamine,multivitamin and folic acid. -On Suboxone  6.   aspiration pneumonia-finished Unasyn. Patient is on IV Bactrim  7. DVT prophylaxis;Lovenox     All the records are reviewed and case discussed with Care Management/Social Workerr. Management plans discussed with the patient, family and they are in agreement.  CODE STATUS: Full code  TOTAL TIME TAKING CARE OF THIS PATIENT: 34 minutes.   POSSIBLE D/C IN ? DAYS, DEPENDING ON CLINICAL CONDITION.   Thomas [name] M.D on 06/21/2019 at 12:54 PM  Between 7am to 6pm - Pager - [phone]  After 6pm go to [email or web address] - password [email or web address] [facility]  Office  [phone]  CC:  Primary care physician; Patient, [name]

## 2019-06-21 NOTE — Consult Note (Signed)
PHARMACY CONSULT NOTE - FOLLOW UP  Pharmacy Consult for Electrolyte Monitoring and Replacement   53 y/o M admitted with right lower extremity ischemia and right ankle fracture now s/p peripheral revascularization and ORIF. Patient has a history significant for schizophrenia and substance abuse. Post-operatively he became extremely agitated likely secondary to drug/alcohol withdrawal requiring high doses of sedatives. He is currently intubated and sedated in the ICU.   Update 10/7: Patient extubated to HFNC  Recent Labs: Potassium (mmol/L)  Date Value  06/21/2019 4.1   Magnesium (mg/dL)  Date Value  06/21/2019 1.9   Calcium (mg/dL)  Date Value  06/21/2019 8.3 (L)   Albumin (g/dL)  Date Value  06/18/2019 2.8 (L)   Phosphorus (mg/dL)  Date Value  06/21/2019 3.6   Sodium (mmol/L)  Date Value  06/21/2019 136    Electrolytes: Patient has a history of alcohol abuse. Baseline nutritional status un-clear. On tube feeds. Goal of therapy to replete electrolytes to normal levels. Patient with good UOP. Received IV lasix yesterday. Regular bowel movements. Sodium trending down today.    -No electrolyte supplementation indicated today   -Free water flushes 100 mL q4h discontinued   -D5LR @ 100 mL started   -BMP, Mg, Phos tomorrow AM  Constipation: Patient is having regular bowel movements. LBM 10/8. Constipating medications include hydromorphone, doxepin (tapering), cyproheptadine. Rectal tube removed. C diff panel 10/6 negative.  Glucose: No baseline A1c in chart and no history of diabetes recorded. Currently on tube feeds. Medications that could contribute to development of new-onset diabetes include quetiapine. Glucoses within acceptable limits.     -Continue to monitor blood glucoses.   Pharmacy will continue to monitor and adjust per consult.   [place] Resident 06/21/2019 2:51 PM

## 2019-06-21 NOTE — Progress Notes (Signed)
Pharmacy is currently awaiting a shipment of Thorazine to come in. They will send up the patient dose for 2200 once it arrives.  Cameron Ali, RN

## 2019-06-22 DIAGNOSIS — J150 Pneumonia due to Klebsiella pneumoniae: Secondary | ICD-10-CM

## 2019-06-22 LAB — CBC WITH DIFFERENTIAL/PLATELET
Abs Immature Granulocytes: 0.09 10*3/uL — ABNORMAL HIGH (ref 0.00–0.07)
Basophils Absolute: 0 10*3/uL (ref 0.0–0.1)
Basophils Relative: 0 %
Eosinophils Absolute: 0.4 10*3/uL (ref 0.0–0.5)
Eosinophils Relative: 3 %
HCT: 28.3 % — ABNORMAL LOW (ref 39.0–52.0)
Hemoglobin: 9.5 g/dL — ABNORMAL LOW (ref 13.0–17.0)
Immature Granulocytes: 1 %
Lymphocytes Relative: 10 %
Lymphs Abs: 1.1 10*3/uL (ref 0.7–4.0)
MCH: 33.2 pg (ref 26.0–34.0)
MCHC: 33.6 g/dL (ref 30.0–36.0)
MCV: 99 fL (ref 80.0–100.0)
Monocytes Absolute: 0.7 10*3/uL (ref 0.1–1.0)
Monocytes Relative: 6 %
Neutro Abs: 9.6 10*3/uL — ABNORMAL HIGH (ref 1.7–7.7)
Neutrophils Relative %: 80 %
Platelets: 306 10*3/uL (ref 150–400)
RBC: 2.86 MIL/uL — ABNORMAL LOW (ref 4.22–5.81)
RDW: 13.3 % (ref 11.5–15.5)
WBC: 12 10*3/uL — ABNORMAL HIGH (ref 4.0–10.5)
nRBC: 0 % (ref 0.0–0.2)

## 2019-06-22 LAB — BASIC METABOLIC PANEL
Anion gap: 7 (ref 5–15)
BUN: 11 mg/dL (ref 6–20)
CO2: 23 mmol/L (ref 22–32)
Calcium: 8 mg/dL — ABNORMAL LOW (ref 8.9–10.3)
Chloride: 105 mmol/L (ref 98–111)
Creatinine, Ser: 0.6 mg/dL — ABNORMAL LOW (ref 0.61–1.24)
GFR calc Af Amer: >60 mL/min (ref >60)
GFR calc non Af Amer: >60 mL/min (ref >60)
Glucose, Bld: 117 mg/dL — ABNORMAL HIGH (ref 70–99)
Potassium: 3.9 mmol/L (ref 3.5–5.1)
Sodium: 135 mmol/L (ref 135–145)

## 2019-06-22 LAB — GLUCOSE, CAPILLARY
Glucose-Capillary: 75 mg/dL (ref 70–99)
Glucose-Capillary: 85 mg/dL (ref 70–99)
Glucose-Capillary: 76 mg/dL (ref 70–99)
Glucose-Capillary: 88 mg/dL (ref 70–99)

## 2019-06-22 LAB — MAGNESIUM: Magnesium: 2 mg/dL (ref 1.7–2.4)

## 2019-06-22 LAB — PHOSPHORUS: Phosphorus: 4.7 mg/dL — ABNORMAL HIGH (ref 2.5–4.6)

## 2019-06-22 MED ORDER — LORAZEPAM 2 MG/ML IJ SOLN
2.0000 mg | Freq: Once | INTRAMUSCULAR | Status: AC
  Administered 2019-06-22: 2 mg via INTRAVENOUS

## 2019-06-22 NOTE — TOC Initial Note (Signed)
Transition of Care [facility]) - Initial/Assessment Note    Patient Details  Name: Thomas [name] MRN: [id number] Date of Birth: Oct 30, 1965  Transition of Care [facility]) CM/SW Contact:    Annamaria Boots, [place] Phone Number: 06/22/2019, 3:43 PM  Clinical Narrative:    CSW consulted for potential LTACH placement. CSW found that patient has only Medicaid and LTACH will not accept Medicaid. Patient does not meet criteria for Vent/SNF at this time. Patient would have to wean down O2 to B[place] and could then go to SNF. CSW spoke with patient's mother and gave her above information. Mother states that she would be alright with patient going to SNF but she would like to discuss with patient's girlfriend. CSW will notify MD that patient can not go to [facility] due to insurance. CSW will continue to follow for discharge planning.                Expected Discharge Plan: Skilled Nursing Facility Barriers to Discharge: Continued Medical Work up   Patient Goals and CMS Choice        Expected Discharge Plan and Services Expected Discharge Plan: [place] arrangements for the past 2 months: Single Family Home                                      Prior Living Arrangements/Services Living arrangements for the past 2 months: Single Family Home Lives with:: Significant Other Patient language and need for interpreter reviewed:: Yes Do you feel safe going back to the place where you live?: Yes      Need for Family Participation in Patient Care: Yes (Comment) Care giver support system in place?: Yes (comment)   Criminal Activity/Legal Involvement Pertinent to Current Situation/Hospitalization: No - Comment as needed  Activities of Daily Living Home Assistive Devices/Equipment: None ADL Screening (condition at time of admission) Patient's cognitive ability adequate to safely complete daily activities?: Yes Is the patient deaf or have difficulty hearing?: No Does the  patient have difficulty seeing, even when wearing glasses/contacts?: No Does the patient have difficulty concentrating, remembering, or making decisions?: No Patient able to express need for assistance with ADLs?: No Does the patient have difficulty dressing or bathing?: No Independently performs ADLs?: Yes (appropriate for developmental age) Does the patient have difficulty walking or climbing stairs?: No Weakness of Legs: Right Weakness of Arms/Hands: None  Permission Sought/Granted Permission sought to share information with : Case Manager, Customer service manager, Family Supports Permission granted to share information with : Yes, Verbal Permission Granted              Emotional Assessment Appearance:: Appears stated age Attitude/Demeanor/Rapport: Aggressive (Verbally and/or physically) Affect (typically observed): Restless Orientation: : Oriented to Self Alcohol / Substance Use: Alcohol Use Psych Involvement: Yes (comment)  Admission diagnosis:  Reduction defect of lower extremity [Q72.90] Absent foot pulse [R09.89] Closed fracture of right ankle, initial encounter [S82.891A] Ischemia of extremity [I99.8] Patient Active Problem List   Diagnosis Date Noted  . Acute respiratory failure ([place])   . Schizoaffective disorder, bipolar type ([place]) 06/09/2019  . Ischemia of extremity 06/07/2019   PCP:  Patient, [name] Pharmacy:   [facility] [place], [place] - [address] [place] [address] Phone: [phone] Fax: [phone]  Social Determinants of Health (SDOH) Interventions    Readmission Risk Interventions No flowsheet data found.

## 2019-06-22 NOTE — Progress Notes (Signed)
Per rounds, attempted to wean patients precedex gtt off. Gtt was weaned from 1.5 mcg/kg/hr to 0.8 mcg/kg/hr. However patient began to get more agitated so a prn dose of ativan was given and gtt was increase to 1 mcg/kg/hr. Sitter at bedside throughout shift.

## 2019-06-22 NOTE — Consult Note (Signed)
  Patient seen in ICU mother present at bedside.  Patient is now extubated, and starting to wean off sedation.  Small movements and murmurs noted.  Patient does not appear agitated at this time.  Per nurse report patient is starting to wake up as sedation is being weaned.  Patient still on Precedex at this time.  Psychiatric medications are being administered IV and sublingual when possible.  Patient seems to be improving.  Psychiatry will continue to follow.  Continue with current medications as ordered.  Psychiatry will assist with transition to p.o. medications when patient is able to tolerate

## 2019-06-22 NOTE — Progress Notes (Signed)
CRITICAL CARE NOTE Thomas [name] is an 53 y.o. male with multiple comorbidities to include bipolar disorder and schizophrenia, seizure disorder, major depressive disorder with psychotic episodes and alcohol abuse presented with right ankle pain found to have a fracture and also found to have critical limb ischemia of the right lower extremity.  Patient had to have stents placed for his limb ischemia, post operatively he was too combative and require intubation and sedation for management.     CC  follow up respiratory failure  SUBJECTIVE S/p extubation Patient remains critically ill Prognosis is guarded On high flow [place] 65% High risk for re-intubation Remains delerious   BP 107/75   Pulse 77   Temp 97.8 F (36.6 C) (Axillary)   Resp 16   Ht 6' 0.01" (1.829 m)   Wt 71.2 kg   SpO2 97%   BMI 21.28 kg/m    I/O last 3 completed shifts: In: 5222.5 [I.V.:2515.5; IV Piggyback:2707] Out: 3710 [Urine:5425; Stool:200] No intake/output data recorded.  SpO2: 97 % O2 Flow Rate (L/min): 40 L/min FiO2 (%): 60 %  PICC Double Lumen 06/12/19 PICC Right Brachial 44 cm 2 cm 9 days [REMOVED] Peripheral IV 06/11/19 Posterior;Proximal;Left Forearm 4 days [REMOVED] Peripheral IV 06/07/19 Right Forearm 3 days [REMOVED] Peripheral IV 06/10/19 Left Wrist 2 days [REMOVED] Peripheral IV 06/07/19 Left Wrist 1 day [REMOVED] Peripheral IV 06/10/19 Right    Events: 9/24- Presented to ED with displaced right ankle fracture and limb ischemia 9/24- Vascular surgery to revascularize right lower extremity 9/25- Open reduction internal fixation of right ankle fracture medially and laterally 9/26- patient on precedex and multiple PRN IV meds still tachycardic with episodes of aggitation and combative/aggresive behavior. Concern for loss of airway protection 9/27- patient attempting to physically attack staff and loudly verbally abusive, he is in high doses of IV sedation. After additional  PRN IV medications patient temporarilly sedated with episodic apnea. Pt sedated and intubated. 9/28 failed SAT due to severe agitation and delirium 9/29PICC line placed 9/30 failed SAT again for severe agitation and delirium 9/30 CXR suspicious for right lower lobe pneumonia, began treatment with Unasyn 10/2Pt failed spontaneous breathing trial with sedation 10/3 remains on the ventilator family and do so informed ICU MD with regards to seizure and psychiatric issues recently. 10/4 updated mother at bedside.  Instituted changes to sedatives. 10/6 Started SBT 10/6 changed Levaquin to Bactrim due to Stenotrophomonas/Klebsiella 10/7 extubated 10/8 severe hypoxia  REVIEW OF SYSTEMS  PATIENT IS UNABLE TO PROVIDE COMPLETE REVIEW OF SYSTEMS DUE TO SEVERE CRITICAL ILLNESS   PHYSICAL EXAMINATION:  GENERAL:critically ill appearing,  HEAD: Normocephalic, atraumatic.  EYES: Pupils equal, round, reactive to light.  No scleral icterus.  MOUTH: Moist mucosal membrane. NECK: Supple.  PULMONARY: +rhonchi, +wheezing CARDIOVASCULAR: S1 and S2. Regular rate and rhythm. No murmurs, rubs, or gallops.  GASTROINTESTINAL: Soft, nontender, -distended. No masses. Positive bowel sounds. No hepatosplenomegaly.  MUSCULOSKELETAL: No swelling, clubbing, or edema.  NEUROLOGIC: obtunded,  SKIN:intact,warm,dry  MEDICATIONS: I have reviewed all medications and confirmed regimen as documented   CULTURE RESULTS   Recent Results (from the past 240 hour(s))  Culture, respiratory     Status: None   Collection Time: 06/13/19  3:28 PM   Specimen: Tracheal Aspirate  Result Value Ref Range Status   Specimen Description   Final    TRACHEAL ASPIRATE Performed at [facility], [address], [place], [place] [address]    Special Requests   Final    NONE Performed at  [facility], [address] [place], [place] [address]    Gram Stain   Final    ABUNDANT WBC PRESENT, PREDOMINANTLY  PMN ABUNDANT GRAM POSITIVE COCCI RARE GRAM NEGATIVE RODS Performed at [facility], [address] [place], [place] [address]    Culture   Final    ABUNDANT KLEBSIELLA OXYTOCA ABUNDANT STENOTROPHOMONAS MALTOPHILIA    Report Status 06/17/2019 FINAL  Final   Organism ID, Bacteria KLEBSIELLA OXYTOCA  Final   Organism ID, Bacteria STENOTROPHOMONAS MALTOPHILIA  Final      Susceptibility   Klebsiella oxytoca - MIC*    AMPICILLIN >=32 RESISTANT Resistant     CEFAZOLIN >=64 RESISTANT Resistant     CEFEPIME <=1 SENSITIVE Sensitive     CEFTAZIDIME <=1 SENSITIVE Sensitive     CEFTRIAXONE <=1 SENSITIVE Sensitive     CIPROFLOXACIN <=0.25 SENSITIVE Sensitive     GENTAMICIN <=1 SENSITIVE Sensitive     IMIPENEM <=0.25 SENSITIVE Sensitive     TRIMETH/SULFA <=20 SENSITIVE Sensitive     AMPICILLIN/SULBACTAM 4 SENSITIVE Sensitive     PIP/TAZO <=4 SENSITIVE Sensitive     Extended ESBL NEGATIVE Sensitive     * ABUNDANT KLEBSIELLA OXYTOCA   Stenotrophomonas maltophilia - MIC*    LEVOFLOXACIN 0.25 SENSITIVE Sensitive     TRIMETH/SULFA <=20 SENSITIVE Sensitive     * ABUNDANT STENOTROPHOMONAS MALTOPHILIA  C difficile quick scan w PCR reflex     Status: None   Collection Time: 06/19/19  8:52 AM   Specimen: STOOL  Result Value Ref Range Status   C Diff antigen NEGATIVE NEGATIVE Final   C Diff toxin NEGATIVE NEGATIVE Final   C Diff interpretation No C. difficile detected.  Final    Comment: Performed at [facility], [address] [place], [place] [address]          IMAGING    No results found.     Indwelling Urinary Catheter continued, requirement due to   Reason to continue Indwelling Urinary Catheter strict Intake/Output monitoring for hemodynamic instability   Central Line/ continued, requirement due to  Reason to continue [employer] Line Monitoring of central venous pressure or other hemodynamic parameters and poor IV access   Ventilator continued, requirement due to  severe respiratory failure   Ventilator Sedation RASS 0 to -2      ASSESSMENT AND PLAN SYNOPSIS   Severe ACUTE Hypoxic and Hypercapnic Respiratory Failure from pneumonia High risk for intubation Continue high flow [place]  Severe Underlying history of schizophrenia, bipolar disorder and major depressive disorder with frequent psychotic episodes:  - Continue Depacon for mood stabilization -Thorazine IV he is n.p.o. postextubation -onn Precedex IV off of all other sedatives.  Weaning off Precedex.  Thorazine adjusted today.  Alcohol abuse with delirium tremens -Completed thiamine to 500 mg dose x3 days now back to 100 mg/day. Continue CIWA protocol, Ativan dosage and frequency adjusted.  CARDIAC ICU monitoring  ID -continue IV abx as prescibed -follow up cultures Klebsiella/Stenotrophomonas pneumonia:  -Stenotrophomonas sens.  Continue Bactrim.     GI GI PROPHYLAXIS as indicated  NUTRITIONAL STATUS DIET-->NPO Constipation protocol as indicated  ENDO - will use ICU hypoglycemic\Hyperglycemia protocol if indicated   ELECTROLYTES -follow labs as needed -replace as needed -pharmacy consultation and following   DVT/GI PRX ordered TRANSFUSIONS AS NEEDED MONITOR FSBS ASSESS the need for LABS as needed   Critical Care Time devoted to patient care services described in this note is 35 minutes.   Overall, patient is critically  ill, prognosis is guarded.   high risk for cardiac arrest and death.    Lucie Leather, M.D.  [facility]  Medical Director [facility] Medical Director [facility] Cardio-Pulmonary Department

## 2019-06-22 NOTE — Consult Note (Signed)
Pharmacy Antibiotic Note:  Thomas [name] is a 53 y.o. male with history of psychiatric disorders and polysubstance abuse admitted on 06/07/2019 with pneumonia. Patient presented initially for peripheral revascularization of ischemic limb and ORIF of right ankle fracture. Post-operatively he became combative toward staff and was subsequently intubated. He has required high doses of sedatives this admission and each attempt to wean has resulted in further agitation and combativeness. Patient has completed a course of Unasyn this admission due to c/f aspiration pneumonia. Both bacteria growing in tracheal aspirate are susceptible to Bactrim. Bactrim is the therapy of choice for Stenotrophomonas due to reliable in vitro activity. Pharmacy has been consulted for sulfamethoxazole-trimethoprim dosing.   (10/9): Patient has remained afebrile over past 24 hours.  He was extubated on 10/7, but remains agitated.  WBCs are beginning to trend downward.  Plan: Sulfamethoxazole-trimethoprim 320 mg IV q8h (12.5 mg/kg trimethoprim component)  Today is day 4 of therapy.   Recommended duration of therapy is 7 days for stenotrophomonas pneumonia. Continue to monitor for adverse effects and for signs of clinical improvement.   Height: 6' 0.01" (182.9 cm) Weight: 156 lb 15.5 oz (71.2 kg) IBW/kg (Calculated) : 77.62  Temp (24hrs), Avg:97.7 F (36.5 C), Min:97.5 F (36.4 C), Max:97.9 F (36.6 C)  Recent Labs  Lab 06/18/19 0335 06/19/19 0505 06/20/19 0446 06/21/19 0425 06/22/19 0315  WBC 9.5 8.6 11.1* 14.0* 12.0*  CREATININE 0.58* 0.63 0.65 0.68 0.60*    Estimated Creatinine Clearance: 107.5 mL/min (A) (by C-G formula based on SCr of 0.6 mg/dL (L)).    Allergies  Allergen Reactions  . Haldol [Haloperidol Lactate]     Antimicrobials this admission: Unasyn 9/30 >> 10/3 Levofloxacin 10/3 x1  Bactrim 10/6 >>  Microbiology Results: 10/6 C diff: negative 9/30 Tracheal aspirate: Klebsiella oxytoca,  Stenotrophomonas maltophilia 9/25 MRSA PCR: negative 9/24 COVID 19: negative   Raiford Simmonds, PharmD Candidate 06/22/2019 11:30 AM

## 2019-06-22 NOTE — Progress Notes (Signed)
 Vein & Vascular Surgery Daily Progress Note   Subjective: 14 Days Post-Op: 1. Introduction catheter intorightlower extremity 3rd order catheter placement  2.Contrast injectionrightlower extremity for distal runoff  3. Percutaneous transluminal angioplasty and stent placementrightsuperficial femoral artery  4. Percutaneous transluminal angioplasty and stent placement right external iliac artery 5.Star close closureleftcommon femoral arteriotomy  Patient extubated but confused this AM.   Objective: Vitals:   06/22/19 0900 06/22/19 1000 06/22/19 1057 06/22/19 1100  BP: 101/70 95/68  123/65  Pulse: 73 76  (!) 110  Resp:      Temp:      TempSrc:      SpO2: 94% 93% 93% 92%  Weight:      Height:        Intake/Output Summary (Last 24 hours) at 06/22/2019 1138 Last data filed at 06/22/2019 0800 Gross per 24 hour  Intake 4766.39 ml  Output 4875 ml  Net -108.61 ml   Physical Exam: Consfused, NAD CV: RRR Pulmonary: CTA Bilaterally Abdomen: Soft, Nontender, Nondistended Left Groin: Access site - clean, dry and intact Vascular: Right Lower Extremity: Thigh soft.Unable to access pedal pulses due to cast however toes are warm, pinkwith a good capillary refill.   Laboratory: CBC    Component Value Date/Time   WBC 12.0 (H) 06/22/2019 0315   HGB 9.5 (L) 06/22/2019 0315   HCT 28.3 (L) 06/22/2019 0315   PLT 306 06/22/2019 0315   BMET    Component Value Date/Time   NA 135 06/22/2019 0315   K 3.9 06/22/2019 0315   CL 105 06/22/2019 0315   CO2 23 06/22/2019 0315   GLUCOSE 117 (H) 06/22/2019 0315   BUN 11 06/22/2019 0315   CREATININE 0.60 (L) 06/22/2019 0315   CALCIUM 8.0 (L) 06/22/2019 0315   GFRNONAA >60 06/22/2019 0315   GFRAA >60 06/22/2019 0315   Assessment/Planning: The patient is a 53 year old male who presented with right ankle fracture and right lower extremity  ischemias/plower extremity revascularization and open reduction internal fixation ankle fracture with severe alcohol withdrawal now in DTs requiring sedation and subsequent intubation 1) Appreciate assistance in management from ICU,psychiatryand medicine 2) Successful right lower extremity revascularization - no further recommendation from vascular at this time. Will continue to monitor 3) On ASA / Plavix 4) Now extubated.   Seen and examined with Tamera Stands PA-C 06/22/2019 11:38 AM

## 2019-06-22 NOTE — Progress Notes (Signed)
[employer] [facility] [place] [facility]   PATIENT NAME: Thomas [name]    MR#:  [id number]  DATE OF BIRTH:  03/27/1966  SUBJECTIVE:  CHIEF COMPLAINT:   Chief Complaint  Patient presents with  . Fall   -Very agitated in bed, cursing -On Precedex drip.  Requiring IV Ativan pushes.  Sitter at bedside  REVIEW OF SYSTEMS:  Review of Systems  Unable to perform ROS: Critical illness    DRUG ALLERGIES:   Allergies  Allergen Reactions  . Haldol [Haloperidol Lactate]     VITALS:  Blood pressure 106/70, pulse 71, temperature (!) 97.5 F (36.4 C), temperature source Axillary, resp. rate 16, height 6' 0.01" (1.829 m), weight 71.2 kg, SpO2 95 %.  PHYSICAL EXAMINATION:  Physical Exam  GENERAL:  53 y.o.-year-old patient lying in the bed with no acute distress.  Agitated in bed EYES: Pupils equal, round, reactive to light and accommodation. No scleral icterus. Extraocular muscles intact.  HEENT: Head atraumatic, normocephalic. Oropharynx and nasopharynx clear.  NECK:  Supple, no jugular venous distention. No thyroid enlargement, no tenderness.  LUNGS: Normal breath sounds bilaterally, no wheezing, rales,rhonchi or crepitation. No use of accessory muscles of respiration.  Decreased bibasilar breath sounds CARDIOVASCULAR: S1, S2 normal. No murmurs, rubs, or gallops.  ABDOMEN: Soft, nontender, nondistended. Bowel sounds present. No organomegaly or mass.  EXTREMITIES: No  cyanosis, or clubbing.  Right leg and ankle in a cast.  Cool to touch toes, capillary refill is noted. NEUROLOGIC: sedated and restless in bed PSYCHIATRIC: The patient is sedated, very agitated SKIN: No obvious rash, lesion, or ulcer.    LABORATORY PANEL:   CBC Recent Labs  Lab 06/22/19 0315  WBC 12.0*  HGB 9.5*  HCT 28.3*  PLT 306   ------------------------------------------------------------------------------------------------------------------  Chemistries  Recent Labs  Lab 06/18/19 0335   06/22/19 0315  NA 140   < > 135  K 3.9   < > 3.9  CL 103   < > 105  CO2 29   < > 23  GLUCOSE 154*   < > 117*  BUN 24*   < > 11  CREATININE 0.58*   < > 0.60*  CALCIUM 8.8*   < > 8.0*  MG 2.0   < > 2.0  AST 49*  --   --   ALT 52*  --   --   ALKPHOS 86  --   --   BILITOT 0.5  --   --    < > = values in this interval not displayed.   ------------------------------------------------------------------------------------------------------------------  Cardiac Enzymes No results for input(s): TROPONINI in the last 168 hours. ------------------------------------------------------------------------------------------------------------------  RADIOLOGY:  No results found.  EKG:   Orders placed or performed during the hospital encounter of 06/07/19  . EKG 12-Lead  . EKG 12-Lead    ASSESSMENT AND PLAN:   JamesRhodesis a53 y.o.malewith a known history of polysubstance abuse including alcohol,schizoaffective bipolar type,seizures,hypertension and insomnia who initially presented on 06/07/2019 following an episode of a fall. Was evaluated in the emergency room and found to have right ankle fracture as well as right foot ischemia.Went into alcohol withdrawal and currently sedated on the vent  1.Acute hypoxic respiratory failure with acute encephalopathy with agitation  -Secondary to pneumonia. -Patient went into alcohol withdrawal requiring several sedatives and subsequently intubated for hypoxic respiratory failure and airway protection on 06/10/2019 -Patient is extubated on 06/20/2019.  Remains on Precedex drip.  Has a sitter at  bedside as very agitated -Remains n.p.o.  continue to monitor to see if he can be weaned off sedation  2.Right lower extremity ischemia -status post revascularization with angioplasty and stent placement by vascular surgery in right superficial femoral artery, right external iliac artery. -Continue aspirin and Plavix  3.Right ankle fracture. Status  post fall Patient status post open reduction with internal fixation of right ankle by orthopedic physician. Day 14 postop -Patient will need physical therapy -Continue pain medications.  On Suboxone  4.History of schizoaffective bipolar type -Being followed by psychiatry service and managing psych meds.   -Psychiatry following.  Taper sedation if tolerating -also on IV Depacon for mood stabilization  5.Alcohol abuse now with delirium tremens Patient requiring sedation on the vent.  He is on Precedex drip Continue thiamine,multivitamin and folic acid. -On Suboxone  6.  Klebsiella/stenotrophomonas pneumonia.  Patient was on Unasyn initially. -Based on sensitivities, currently on IV Bactrim  7. DVT prophylaxis;Lovenox     All the records are reviewed and case discussed with Care Management/Social Workerr. Management plans discussed with the patient, family and they are in agreement.  CODE STATUS: Full code  TOTAL TIME TAKING CARE OF THIS PATIENT: 34 minutes.   POSSIBLE D/C IN ? DAYS, DEPENDING ON CLINICAL CONDITION.   Gladstone Lighter M.D on 06/22/2019 at 9:59 AM  Between 7am to 6pm - Pager - [phone]  After 6pm go to [email or web address] - password [email or web address] [facility]  Office  [phone]  CC: Primary care physician; Patient, [name]

## 2019-06-22 NOTE — Consult Note (Signed)
PHARMACY CONSULT NOTE - FOLLOW UP  Pharmacy Consult for Electrolyte Monitoring and Replacement   53 y/o M admitted with right lower extremity ischemia and right ankle fracture now s/p peripheral revascularization and ORIF. Patient has a history significant for schizophrenia and substance abuse. Post-operatively he became extremely agitated likely secondary to drug/alcohol withdrawal requiring high doses of sedatives. He is currently intubated and sedated in the ICU.   Update 10/7: Patient extubated to HFNC  Recent Labs: Potassium (mmol/L)  Date Value  06/22/2019 3.9   Magnesium (mg/dL)  Date Value  06/22/2019 2.0   Calcium (mg/dL)  Date Value  06/22/2019 8.0 (L)   Albumin (g/dL)  Date Value  06/18/2019 2.8 (L)   Phosphorus (mg/dL)  Date Value  06/22/2019 4.7 (H)   Sodium (mmol/L)  Date Value  06/22/2019 135   Corrected calcium: 9.0 mg/dL  Electrolytes: Patient has a history of alcohol abuse. Baseline nutritional status un-clear. On tube feeds. Goal of therapy to replete electrolytes to normal levels. Patient with good UOP. Received IV lasix on 10/7. Regular bowel movements. Sodium stable at low end of normal. Free water flushes were dc'ed on 10/7.   -No electrolyte supplementation indicated today   -D5LR decreased to 57mL/hr   -BMP tomorrow AM  Constipation: Patient is having regular bowel movements. LBM 10/8. Constipating medications include hydromorphone, cyproheptadine. Rectal tube removed. C diff panel 10/6 negative.  Glucose: No baseline A1c in chart and no history of DM recorded. Patient is currently NPO. Medications that could contribute to development of new-onset diabetes include quetiapine. Glucoses within acceptable limits.     -Continue to monitor blood glucoses.   Pharmacy will continue to monitor and adjust per consult.   Raiford Simmonds, PharmD Candidate 06/22/2019 11:48 AM

## 2019-06-23 LAB — CBC WITH DIFFERENTIAL/PLATELET
Abs Immature Granulocytes: 0.07 10*3/uL (ref 0.00–0.07)
Basophils Absolute: 0 10*3/uL (ref 0.0–0.1)
Basophils Relative: 0 %
Eosinophils Absolute: 0.4 10*3/uL (ref 0.0–0.5)
Eosinophils Relative: 4 %
HCT: 27.2 % — ABNORMAL LOW (ref 39.0–52.0)
Hemoglobin: 9.1 g/dL — ABNORMAL LOW (ref 13.0–17.0)
Immature Granulocytes: 1 %
Lymphocytes Relative: 12 %
Lymphs Abs: 1.2 10*3/uL (ref 0.7–4.0)
MCH: 33.3 pg (ref 26.0–34.0)
MCHC: 33.5 g/dL (ref 30.0–36.0)
MCV: 99.6 fL (ref 80.0–100.0)
Monocytes Absolute: 1 10*3/uL (ref 0.1–1.0)
Monocytes Relative: 10 %
Neutro Abs: 7.3 10*3/uL (ref 1.7–7.7)
Neutrophils Relative %: 73 %
Platelets: 305 10*3/uL (ref 150–400)
RBC: 2.73 MIL/uL — ABNORMAL LOW (ref 4.22–5.81)
RDW: 13.3 % (ref 11.5–15.5)
WBC: 10.1 10*3/uL (ref 4.0–10.5)
nRBC: 0 % (ref 0.0–0.2)

## 2019-06-23 LAB — BASIC METABOLIC PANEL
Anion gap: 9 (ref 5–15)
BUN: 12 mg/dL (ref 6–20)
CO2: 22 mmol/L (ref 22–32)
Calcium: 7.8 mg/dL — ABNORMAL LOW (ref 8.9–10.3)
Chloride: 104 mmol/L (ref 98–111)
Creatinine, Ser: 0.64 mg/dL (ref 0.61–1.24)
GFR calc Af Amer: >60 mL/min (ref >60)
GFR calc non Af Amer: >60 mL/min (ref >60)
Glucose, Bld: 101 mg/dL — ABNORMAL HIGH (ref 70–99)
Potassium: 3.5 mmol/L (ref 3.5–5.1)
Sodium: 135 mmol/L (ref 135–145)

## 2019-06-23 LAB — MAGNESIUM: Magnesium: 1.9 mg/dL (ref 1.7–2.4)

## 2019-06-23 LAB — PHOSPHORUS: Phosphorus: 5.1 mg/dL — ABNORMAL HIGH (ref 2.5–4.6)

## 2019-06-23 LAB — GLUCOSE, CAPILLARY
Glucose-Capillary: 87 mg/dL (ref 70–99)
Glucose-Capillary: 88 mg/dL (ref 70–99)
Glucose-Capillary: 90 mg/dL (ref 70–99)
Glucose-Capillary: 95 mg/dL (ref 70–99)

## 2019-06-23 MED ORDER — DIAZEPAM 5 MG/ML IJ SOLN
5.0000 mg | Freq: Four times a day (QID) | INTRAMUSCULAR | Status: DC
  Administered 2019-06-23: 5 mg via INTRAVENOUS
  Filled 2019-06-23 (×2): qty 2

## 2019-06-23 MED ORDER — LACTATED RINGERS IV BOLUS
1000.0000 mL | Freq: Once | INTRAVENOUS | Status: AC
  Administered 2019-06-23: 03:00:00 1000 mL via INTRAVENOUS

## 2019-06-23 MED ORDER — DIAZEPAM 5 MG/ML IJ SOLN: 5.0000 mg | Freq: Once | INTRAMUSCULAR | Status: DC

## 2019-06-23 MED ORDER — FOLIC ACID 5 MG/ML IJ SOLN
1.0000 mg | Freq: Every day | INTRAMUSCULAR | Status: DC
  Administered 2019-06-23 – 2019-06-30 (×8): 1 mg via INTRAVENOUS
  Filled 2019-06-23 (×9): qty 0.2

## 2019-06-23 MED ORDER — LACTATED RINGERS IV BOLUS
500.0000 mL | Freq: Once | INTRAVENOUS | Status: AC
  Administered 2019-06-23: 06:00:00 500 mL via INTRAVENOUS

## 2019-06-23 MED ORDER — ACETAMINOPHEN 10 MG/ML IV SOLN
1000.0000 mg | Freq: Three times a day (TID) | INTRAVENOUS | Status: DC
  Administered 2019-06-23: 05:00:00 1000 mg via INTRAVENOUS
  Filled 2019-06-23 (×3): qty 100

## 2019-06-23 MED ORDER — THIAMINE HCL 100 MG/ML IJ SOLN
100.0000 mg | Freq: Every day | INTRAMUSCULAR | Status: DC
  Administered 2019-06-23 – 2019-07-05 (×12): 100 mg via INTRAVENOUS
  Filled 2019-06-23 (×12): qty 2

## 2019-06-23 MED ORDER — HYDROMORPHONE HCL 1 MG/ML IJ SOLN
1.0000 mg | INTRAMUSCULAR | Status: DC | PRN
  Administered 2019-06-23 – 2019-06-24 (×2): 1 mg via INTRAVENOUS
  Filled 2019-06-23 (×3): qty 1

## 2019-06-23 MED ORDER — ACETAMINOPHEN 650 MG RE SUPP
650.0000 mg | Freq: Four times a day (QID) | RECTAL | Status: DC | PRN
  Administered 2019-06-23 – 2019-06-24 (×3): 650 mg via RECTAL
  Filled 2019-06-23 (×3): qty 1

## 2019-06-23 NOTE — Progress Notes (Signed)
CRITICAL CARE NOTE Thomas [name] is an 53 y.o. male with multiple comorbidities to include bipolar disorder and schizophrenia, seizure disorder, major depressive disorder with psychotic episodes and alcohol abuse presented with right ankle pain found to have a fracture and also found to have critical limb ischemia of the right lower extremity.  Patient had to have stents placed for his limb ischemia, post operatively he was too combative and require intubation and sedation for management.     CC  Severe resp failure  SUBJECTIVE S/p extubation Remains critically ill High risk for aspiration High risk for reintubation Remains agitated   BP (!) 61/45   Pulse 67   Temp 97.6 F (36.4 C) (Axillary)   Resp 16   Ht 6' 0.01" (1.829 m)   Wt 71.7 kg   SpO2 97%   BMI 21.43 kg/m    I/O last 3 completed shifts: In: 8115 [I.V.:3929.4; IV Piggyback:4185.6] Out: 5053 [Urine:7575] Total I/O In: 84.6 [I.V.:84.6] Out: -   SpO2: 97 % O2 Flow Rate (L/min): 40 L/min FiO2 (%): 50 %  PICC Double Lumen 06/12/19 PICC Right Brachial 44 cm 2 cm 9 days [REMOVED] Peripheral IV 06/11/19 Posterior;Proximal;Left Forearm 4 days [REMOVED] Peripheral IV 06/07/19 Right Forearm 3 days [REMOVED] Peripheral IV 06/10/19 Left Wrist 2 days [REMOVED] Peripheral IV 06/07/19 Left Wrist 1 day [REMOVED] Peripheral IV 06/10/19 Right    Events: 9/24- Presented to ED with displaced right ankle fracture and limb ischemia 9/24- Vascular surgery to revascularize right lower extremity 9/25- Open reduction internal fixation of right ankle fracture medially and laterally 9/26- patient on precedex and multiple PRN IV meds still tachycardic with episodes of aggitation and combative/aggresive behavior. Concern for loss of airway protection 9/27- patient attempting to physically attack staff and loudly verbally abusive, he is in high doses of IV sedation. After additional PRN IV medications patient  temporarilly sedated with episodic apnea. Pt sedated and intubated. 9/28 failed SAT due to severe agitation and delirium 9/29PICC line placed 9/30 failed SAT again for severe agitation and delirium 9/30 CXR suspicious for right lower lobe pneumonia, began treatment with Unasyn 10/2Pt failed spontaneous breathing trial with sedation 10/3 remains on the ventilator family and do so informed ICU MD with regards to seizure and psychiatric issues recently. 10/4 updated mother at bedside.  Instituted changes to sedatives. 10/6 Started SBT 10/6 changed Levaquin to Bactrim due to Stenotrophomonas/Klebsiella 10/7 extubated 10/8 severe hypoxia 10/9 high risk for aspiration  REVIEW OF SYSTEMS  PATIENT IS UNABLE TO PROVIDE COMPLETE REVIEW OF SYSTEMS DUE TO SEVERE CRITICAL ILLNESS   PHYSICAL EXAMINATION:  GENERAL:critically ill appearing, +resp distress HEAD: Normocephalic, atraumatic.  EYES: Pupils equal, round, reactive to light.  No scleral icterus.  MOUTH: Moist mucosal membrane. NECK: Supple. No thyromegaly. No nodules. No JVD.  PULMONARY: +rhonchi, +wheezing CARDIOVASCULAR: S1 and S2. Regular rate and rhythm. No murmurs, rubs, or gallops.  GASTROINTESTINAL: Soft, nontender, -distended. No masses. Positive bowel sounds. No hepatosplenomegaly.  MUSCULOSKELETAL: No swelling, clubbing, or edema.  NEUROLOGIC: agitated, very poor insight SKIN:intact,warm,dry     CULTURE RESULTS   Recent Results (from the past 240 hour(s))  Culture, respiratory     Status: None   Collection Time: 06/13/19  3:28 PM   Specimen: Tracheal Aspirate  Result Value Ref Range Status   Specimen Description   Final    TRACHEAL ASPIRATE Performed at [facility], [address], [place], [place] [address]    Special Requests   Final    NONE  Performed at [facility], [address] [place], [place] [address]    Gram Stain   Final    ABUNDANT WBC PRESENT, PREDOMINANTLY PMN ABUNDANT  GRAM POSITIVE COCCI RARE GRAM NEGATIVE RODS Performed at [facility], [address], [place], [place] [address]    Culture   Final    ABUNDANT KLEBSIELLA OXYTOCA ABUNDANT STENOTROPHOMONAS MALTOPHILIA    Report Status 06/17/2019 FINAL  Final   Organism ID, Bacteria KLEBSIELLA OXYTOCA  Final   Organism ID, Bacteria STENOTROPHOMONAS MALTOPHILIA  Final      Susceptibility   Klebsiella oxytoca - MIC*    AMPICILLIN >=32 RESISTANT Resistant     CEFAZOLIN >=64 RESISTANT Resistant     CEFEPIME <=1 SENSITIVE Sensitive     CEFTAZIDIME <=1 SENSITIVE Sensitive     CEFTRIAXONE <=1 SENSITIVE Sensitive     CIPROFLOXACIN <=0.25 SENSITIVE Sensitive     GENTAMICIN <=1 SENSITIVE Sensitive     IMIPENEM <=0.25 SENSITIVE Sensitive     TRIMETH/SULFA <=20 SENSITIVE Sensitive     AMPICILLIN/SULBACTAM 4 SENSITIVE Sensitive     PIP/TAZO <=4 SENSITIVE Sensitive     Extended ESBL NEGATIVE Sensitive     * ABUNDANT KLEBSIELLA OXYTOCA   Stenotrophomonas maltophilia - MIC*    LEVOFLOXACIN 0.25 SENSITIVE Sensitive     TRIMETH/SULFA <=20 SENSITIVE Sensitive     * ABUNDANT STENOTROPHOMONAS MALTOPHILIA  C difficile quick scan w PCR reflex     Status: None   Collection Time: 06/19/19  8:52 AM   Specimen: STOOL  Result Value Ref Range Status   C Diff antigen NEGATIVE NEGATIVE Final   C Diff toxin NEGATIVE NEGATIVE Final   C Diff interpretation No C. difficile detected.  Final    Comment: Performed at [facility], [address] [place], [place] [address]          ASSESSMENT AND PLAN SYNOPSIS  Severe ACUTE Hypoxic and Hypercapnic Respiratory Failure from pneumonia High risk for intubation Continue high flow [place]   Severe underlying schizophrenia bipolar disorder and major depressive disorder with acute psychosis and delirium Continue Depakote for mood stabilization Thorazine IV Wean Precedex as tolerated Pain meds as needed  Alcohol abuse with delirium tremens Continue  thiamine CIWA protocol Ativan as needed  Cardiac ICU monitoring   ID Continue IV antibiotics Klebsiella and stenotrophomonas pneumonia Continue Bactrim   NPO STATUS  ELECTROLYTES -follow labs as needed -replace as needed -pharmacy consultation and following    DVT/GI PRX ordered TRANSFUSIONS AS NEEDED MONITOR FSBS ASSESS the need for LABS as needed     Critical Care Time devoted to patient care services described in this note is 32 minutes.   Overall, patient is critically ill, prognosis is guarded.    Lucie Leather, M.D.  [facility]  Medical Director [facility] Medical Director [facility] Cardio-Pulmonary Department

## 2019-06-23 NOTE — Consult Note (Signed)
Patient seen in ICU mother present at bedside.  Patient was extubated yesterday and starting to wean off sedation.  Client is calmly resting in bed on assessment, watching television.  Sitter is at his bedside and reports he has been calm all day and slept last night.  His mother visited earlier and he did respond to her with, "hey mama."  Difficulty to understand at this point.  His mitts were removed this morning with no agitation noted.  Patient is improving, psychiatry will continue to follow.  Continue with current medications as ordered.  Psychiatry will assist with transition to p.o. medications when patient is able to tolerate.  [name], [facility] NP

## 2019-06-23 NOTE — Consult Note (Addendum)
Pharmacy Antibiotic Note  Thomas [name] is a 53 y.o. male with history of psychiatric disorders and polysubstance abuse admitted on 06/07/2019 with pneumonia. Patient presented initially for peripheral revascularization of ischemic limb and ORIF of right ankle fracture. Post-operatively he became combative toward staff and was subsequently intubated. He has required high doses of sedatives this admission and each attempt to wean has resulted in further agitation and combativeness. Patient has completed a course of Unasyn this admission due to c/f aspiration pneumonia. Both bacteria growing in tracheal aspirate are susceptible to Bactrim. Bactrim is the therapy of choice for Stenotrophomonas due to reliable in vitro activity. Pharmacy has been consulted for sulfamethoxazole-trimethoprim dosing.   10/7 CXR significant for slight progression of hazy infiltrate in right lung base and new hazy infiltrate in left lung base. Patient with mild fever this morning.  WBC have been trending up with a slight left shift noted on differential. Fevers appear to be resolving. Pt was extubated. Remains NPO.   10/9: Patient has remained afebrile over past 24 hours.  He was extubated on 10/7, but remains agitated.  WBCs are beginning to trend downward.  Plan: Sulfamethoxazole-trimethoprim 320 mg IV q8h (12.5 mg/kg trimethoprim component)  Today is day 5 of therapy.   Recommended duration of therapy is 7 days for stenotrophomonas pneumonia. Continue to monitor for adverse effects and for signs of clinical improvement.   Height: 6' 0.01" (182.9 cm) Weight: 158 lb 1.1 oz (71.7 kg) IBW/kg (Calculated) : 77.62  Temp (24hrs), Avg:97.8 F (36.6 C), Min:97.5 F (36.4 C), Max:98.4 F (36.9 C)  Recent Labs  Lab 06/19/19 0505 06/20/19 0446 06/21/19 0425 06/22/19 0315 06/23/19 0325  WBC 8.6 11.1* 14.0* 12.0* 10.1  CREATININE 0.63 0.65 0.68 0.60* 0.64    Estimated Creatinine Clearance: 108.3 mL/min (by C-G  formula based on SCr of 0.64 mg/dL).    Allergies  Allergen Reactions  . Haldol [Haloperidol Lactate]     Antimicrobials this admission: Unasyn 9/30 >> 10/3 Levofloxacin 10/3 x1  Bactrim 10/6 >>  Microbiology results: 10/6 C diff: negative 9/30 Tracheal aspirate: Klebsiella oxytoca, Stenotrophomonas maltophilia 9/25 MRSA PCR: negative 9/24 COVID 19: negative  Thank you for allowing pharmacy to be a part of this patient's care.  [name] , PharmD, BCPS 06/23/2019 7:43 AM

## 2019-06-23 NOTE — Consult Note (Signed)
PHARMACY CONSULT NOTE - FOLLOW UP  Pharmacy Consult for Electrolyte Monitoring and Replacement   53 y/o M admitted with right lower extremity ischemia and right ankle fracture now s/p peripheral revascularization and ORIF. Patient has a history significant for schizophrenia and substance abuse. Post-operatively he became extremely agitated likely secondary to drug/alcohol withdrawal requiring high doses of sedatives. He is currently intubated and sedated in the ICU.   Update 10/7: Patient extubated to HFNC  Recent Labs: Potassium (mmol/L)  Date Value  06/23/2019 3.5   Magnesium (mg/dL)  Date Value  06/23/2019 1.9   Calcium (mg/dL)  Date Value  06/23/2019 7.8 (L)   Albumin (g/dL)  Date Value  06/18/2019 2.8 (L)   Phosphorus (mg/dL)  Date Value  06/23/2019 5.1 (H)   Sodium (mmol/L)  Date Value  06/23/2019 135   Corrected calcium: 9.0 mg/dL  Electrolytes: Patient has a history of alcohol abuse. Baseline nutritional status un-clear. On tube feeds. Goal of therapy to replete electrolytes to normal levels. Patient with good UOP. Received IV lasix on 10/7. Regular bowel movements. Sodium stable at low end of normal. Free water flushes were dc'ed on 10/7. Pt is on bactrim, which can elevated potassium levels.Phos is slightly elevated - messaged MD and plan is to monitor. Pt is on D5 w/ LR with stable renal function.   -No electrolyte supplementation indicated today   -D5LR decreased to 61mL/hr   -BMP tomorrow AM  Constipation: Patient is having regular bowel movements. LBM 10/8. Constipating medications include hydromorphone, cyproheptadine. Rectal tube removed. C diff panel 10/6 negative.  Glucose: No baseline A1c in chart and no history of DM recorded. Patient is currently NPO. Medications that could contribute to development of new-onset diabetes include quetiapine. Glucoses within acceptable limits.     -Continue to monitor blood glucoses.   Pharmacy will continue to monitor  and adjust per consult.   Thomas [name], PharmD, BCPS 06/23/2019 7:47 AM

## 2019-06-23 NOTE — Progress Notes (Signed)
[employer] - [place] at [facility]   PATIENT NAME: Thomas [name]    MR#:  [id number]  DATE OF BIRTH:  September 07, 1966  SUBJECTIVE:  CHIEF COMPLAINT:   Chief Complaint  Patient presents with  . Fall   -Much calmer today.  Remains on Precedex drip. -On high flow nasal cannula  REVIEW OF SYSTEMS:  Review of Systems  Unable to perform ROS: Critical illness    DRUG ALLERGIES:   Allergies  Allergen Reactions  . Haldol [Haloperidol Lactate]     VITALS:  Blood pressure (!) 89/56, pulse 81, temperature 98.1 F (36.7 C), temperature source Oral, resp. rate 16, height 6' 0.01" (1.829 m), weight 71.7 kg, SpO2 94 %.  PHYSICAL EXAMINATION:  Physical Exam  GENERAL:  53 y.o.-year-old patient lying in the bed with no acute distress.  EYES: Pupils equal, round, reactive to light and accommodation. No scleral icterus. Extraocular muscles intact.  HEENT: Head atraumatic, normocephalic. Oropharynx and nasopharynx clear.  NECK:  Supple, no jugular venous distention. No thyroid enlargement, no tenderness.  LUNGS: Normal breath sounds bilaterally, no wheezing, rales,rhonchi or crepitation. No use of accessory muscles of respiration.  Decreased bibasilar breath sounds CARDIOVASCULAR: S1, S2 normal. No murmurs, rubs, or gallops.  ABDOMEN: Soft, nontender, nondistended. Bowel sounds present. No organomegaly or mass.  EXTREMITIES: No  cyanosis, or clubbing.  Right leg and ankle in a cast.  Cool to touch toes, capillary refill is noted. NEUROLOGIC: sedated   PSYCHIATRIC: The patient is sedated, much calmer and trying to nod his head to a couple of questions SKIN: No obvious rash, lesion, or ulcer.    LABORATORY PANEL:   CBC Recent Labs  Lab 06/23/19 0325  WBC 10.1  HGB 9.1*  HCT 27.2*  PLT 305   ------------------------------------------------------------------------------------------------------------------  Chemistries  Recent Labs  Lab 06/18/19 0335  06/23/19  0325  NA 140   < > 135  K 3.9   < > 3.5  CL 103   < > 104  CO2 29   < > 22  GLUCOSE 154*   < > 101*  BUN 24*   < > 12  CREATININE 0.58*   < > 0.64  CALCIUM 8.8*   < > 7.8*  MG 2.0   < > 1.9  AST 49*  --   --   ALT 52*  --   --   ALKPHOS 86  --   --   BILITOT 0.5  --   --    < > = values in this interval not displayed.   ------------------------------------------------------------------------------------------------------------------  Cardiac Enzymes No results for input(s): TROPONINI in the last 168 hours. ------------------------------------------------------------------------------------------------------------------  RADIOLOGY:  No results found.  EKG:   Orders placed or performed during the hospital encounter of 06/07/19  . EKG 12-Lead  . EKG 12-Lead    ASSESSMENT AND PLAN:   JamesRhodesis a53 y.o.malewith a known history of polysubstance abuse including alcohol,schizoaffective bipolar type,seizures,hypertension and insomnia who initially presented on 06/07/2019 following an episode of a fall. Was evaluated in the emergency room and found to have right ankle fracture as well as right foot ischemia.Went into alcohol withdrawal and currently sedated on the vent  1.Acute hypoxic respiratory failure with acute encephalopathy with agitation  -Secondary to pneumonia. -Patient went into alcohol withdrawal requiring several sedatives and subsequently intubated for hypoxic respiratory failure and airway protection on 06/10/2019 -Patient is extubated on 06/20/2019.  Remains on Precedex drip.  Has a sitter at  bedside as very agitated. -  Remains n.p.o. continue to monitor to see if he can be weaned off sedation  2.Right lower extremity ischemia -status post revascularization with angioplasty and stent placement by vascular surgery in right superficial femoral artery, right external iliac artery. -Continue aspirin and Plavix  3.Right ankle fracture. Status post  fall Patient status post open reduction with internal fixation of right ankle by orthopedic physician. Day 15 postop -Patient will need physical therapy -Continue pain medications.  On Suboxone  4.History of schizoaffective bipolar type -Being followed by psychiatry service and managing psych meds.   -Psychiatry following.  Taper sedation if tolerating -also on IV Depacon for mood stabilization.  Also on IV Thorazine  5.Alcohol abuse now with delirium tremens Patient requiring sedation on the vent.  He is on Precedex drip Continue thiamine,multivitamin and folic acid. -On Suboxone  6.  Klebsiella/stenotrophomonas pneumonia.  Patient was on Unasyn initially. -Based on sensitivities, currently on IV Bactrim -On low-dose Levophed this morning  7. DVT prophylaxis;Lovenox  Discussed with ICU attending   All the records are reviewed and case discussed with Care Management/Social Workerr. Management plans discussed with the patient, family and they are in agreement.  CODE STATUS: Full code  TOTAL TIME TAKING CARE OF THIS PATIENT: 33 minutes.   POSSIBLE D/C IN ? DAYS, DEPENDING ON CLINICAL CONDITION.   Gladstone Lighter M.D on 06/23/2019 at 10:22 AM  Between 7am to 6pm - Pager - [phone]  After 6pm go to [email or web address] - password [email or web address] [facility]  Office  [phone]  CC: Primary care physician; Patient, [name]

## 2019-06-24 DIAGNOSIS — I998 Other disorder of circulatory system: Secondary | ICD-10-CM | POA: Diagnosis not present

## 2019-06-24 LAB — BASIC METABOLIC PANEL
Anion gap: 8 (ref 5–15)
BUN: 7 mg/dL (ref 6–20)
CO2: 25 mmol/L (ref 22–32)
Calcium: 8.3 mg/dL — ABNORMAL LOW (ref 8.9–10.3)
Chloride: 105 mmol/L (ref 98–111)
Creatinine, Ser: 0.61 mg/dL (ref 0.61–1.24)
GFR calc Af Amer: >60 mL/min (ref >60)
GFR calc non Af Amer: >60 mL/min (ref >60)
Glucose, Bld: 96 mg/dL (ref 70–99)
Potassium: 3.6 mmol/L (ref 3.5–5.1)
Sodium: 138 mmol/L (ref 135–145)

## 2019-06-24 LAB — GLUCOSE, CAPILLARY
Glucose-Capillary: 100 mg/dL — ABNORMAL HIGH (ref 70–99)
Glucose-Capillary: 103 mg/dL — ABNORMAL HIGH (ref 70–99)
Glucose-Capillary: 118 mg/dL — ABNORMAL HIGH (ref 70–99)
Glucose-Capillary: 56 mg/dL — ABNORMAL LOW (ref 70–99)
Glucose-Capillary: 57 mg/dL — ABNORMAL LOW (ref 70–99)
Glucose-Capillary: 64 mg/dL — ABNORMAL LOW (ref 70–99)
Glucose-Capillary: 77 mg/dL (ref 70–99)
Glucose-Capillary: 79 mg/dL (ref 70–99)

## 2019-06-24 LAB — MAGNESIUM: Magnesium: 1.9 mg/dL (ref 1.7–2.4)

## 2019-06-24 LAB — PHOSPHORUS: Phosphorus: 4.8 mg/dL — ABNORMAL HIGH (ref 2.5–4.6)

## 2019-06-24 MED ORDER — DEXTROSE 50 % IV SOLN
1.0000 | Freq: Once | INTRAVENOUS | Status: AC
  Administered 2019-06-24: 12:00:00 50 mL via INTRAVENOUS

## 2019-06-24 MED ORDER — CHLORHEXIDINE GLUCONATE 0.12 % MT SOLN
OROMUCOSAL | Status: AC
  Filled 2019-06-24: qty 15

## 2019-06-24 MED ORDER — DEXTROSE 50 % IV SOLN
INTRAVENOUS | Status: AC
  Filled 2019-06-24: qty 50

## 2019-06-24 MED ORDER — KETOROLAC TROMETHAMINE 30 MG/ML IJ SOLN: 30.0000 mg | Freq: Three times a day (TID) | INTRAMUSCULAR | Status: DC | PRN

## 2019-06-24 MED ORDER — CLOBETASOL PROPIONATE 0.05 % EX CREA
TOPICAL_CREAM | Freq: Two times a day (BID) | CUTANEOUS | Status: DC
  Administered 2019-06-24 – 2019-07-09 (×25): via TOPICAL
  Filled 2019-06-24 (×3): qty 15

## 2019-06-24 NOTE — Consult Note (Signed)
PHARMACY CONSULT NOTE - FOLLOW UP  Pharmacy Consult for Electrolyte Monitoring and Replacement   53 y/o M admitted with right lower extremity ischemia and right ankle fracture now s/p peripheral revascularization and ORIF. Patient has a history significant for schizophrenia and substance abuse. Post-operatively he became extremely agitated likely secondary to drug/alcohol withdrawal requiring high doses of sedatives. He is currently intubated and sedated in the ICU.   Update 10/7: Patient extubated to HFNC  Recent Labs: Potassium (mmol/L)  Date Value  06/24/2019 3.6   Magnesium (mg/dL)  Date Value  06/24/2019 1.9   Calcium (mg/dL)  Date Value  06/24/2019 8.3 (L)   Albumin (g/dL)  Date Value  06/18/2019 2.8 (L)   Phosphorus (mg/dL)  Date Value  06/24/2019 4.8 (H)   Sodium (mmol/L)  Date Value  06/24/2019 138   Corrected calcium: 9.0 mg/dL  Electrolytes: Patient has a history of alcohol abuse. Baseline nutritional status un-clear. On tube feeds. Goal of therapy to replete electrolytes to normal levels. Patient with good UOP. Received IV lasix on 10/7. Regular bowel movements. Sodium stable at low end of normal. Free water flushes were dc'ed on 10/7. Pt is on bactrim, which can elevated potassium levels.Phos is slightly elevated - messaged MD and plan is to monitor. Pt is on D5 w/ LR with stable renal function.   -No electrolyte supplementation indicated today   -D5LR decreased to 55mL/hr   -BMP tomorrow AM  Constipation: Patient is having regular bowel movements. LBM 10/8. Constipating medications include hydromorphone, cyproheptadine. Rectal tube removed. C diff panel 10/6 negative. Ordering PRN senna.   Glucose: No baseline A1c in chart and no history of DM recorded. Patient is currently NPO. Medications that could contribute to development of new-onset diabetes include quetiapine. Glucoses within acceptable limits.     -Continue to monitor blood glucoses. Pt on dextrose w/  LR infusion. No DM agents.   Pharmacy will continue to monitor and adjust per consult.   [name], PharmD, BCPS 06/24/2019 8:04 AM

## 2019-06-24 NOTE — Progress Notes (Signed)
[employer] - [place] at [facility]   PATIENT NAME: Thomas [name]    MR#:  [id number]  DATE OF BIRTH:  31-Jul-1966  SUBJECTIVE:  CHIEF COMPLAINT:   Chief Complaint  Patient presents with  . Fall   -Calmer in bed, off Precedex drip.  Incomprehensible conversation. -Off high flow oxygen, on 4 L via nasal cannula.  Sinus tachycardia -Sitter at bedside  REVIEW OF SYSTEMS:  Review of Systems  Unable to perform ROS: Critical illness    DRUG ALLERGIES:   Allergies  Allergen Reactions  . Haldol [Haloperidol Lactate]     VITALS:  Blood pressure 120/77, pulse (!) 121, temperature (!) 100.8 F (38.2 C), temperature source Oral, resp. rate 16, height 6' 0.01" (1.829 m), weight 71 kg, SpO2 92 %.  PHYSICAL EXAMINATION:  Physical Exam  GENERAL:  53 y.o.-year-old patient lying in the bed with no acute distress.  EYES: Pupils equal, round, reactive to light and accommodation. No scleral icterus. Extraocular muscles intact.  HEENT: Head atraumatic, normocephalic. Oropharynx and nasopharynx clear.  NECK:  Supple, no jugular venous distention. No thyroid enlargement, no tenderness.  LUNGS: Normal breath sounds bilaterally, no wheezing, rales,rhonchi or crepitation. No use of accessory muscles of respiration.  Decreased bibasilar breath sounds CARDIOVASCULAR: S1, S2 normal. No murmurs, rubs, or gallops.  ABDOMEN: Soft, nontender, nondistended. Bowel sounds present. No organomegaly or mass.  EXTREMITIES: No  cyanosis, or clubbing.  Right leg and ankle in a cast.  Cool to touch toes, capillary refill is noted. NEUROLOGIC: sedated   PSYCHIATRIC: The patient is alert, very confused, calm but incomprehensible conversation  SKIN: No obvious rash, lesion, or ulcer.    LABORATORY PANEL:   CBC Recent Labs  Lab 06/23/19 0325  WBC 10.1  HGB 9.1*  HCT 27.2*  PLT 305    ------------------------------------------------------------------------------------------------------------------  Chemistries  Recent Labs  Lab 06/18/19 0335  06/24/19 0542  NA 140   < > 138  K 3.9   < > 3.6  CL 103   < > 105  CO2 29   < > 25  GLUCOSE 154*   < > 96  BUN 24*   < > 7  CREATININE 0.58*   < > 0.61  CALCIUM 8.8*   < > 8.3*  MG 2.0   < > 1.9  AST 49*  --   --   ALT 52*  --   --   ALKPHOS 86  --   --   BILITOT 0.5  --   --    < > = values in this interval not displayed.   ------------------------------------------------------------------------------------------------------------------  Cardiac Enzymes No results for input(s): TROPONINI in the last 168 hours. ------------------------------------------------------------------------------------------------------------------  RADIOLOGY:  No results found.  EKG:   Orders placed or performed during the hospital encounter of 06/07/19  . EKG 12-Lead  . EKG 12-Lead    ASSESSMENT AND PLAN:   JamesRhodesis a53 y.o.malewith a known history of polysubstance abuse including alcohol,schizoaffective bipolar type,seizures,hypertension and insomnia who initially presented on 06/07/2019 following an episode of a fall. Was evaluated in the emergency room and found to have right ankle fracture as well as right foot ischemia.Went into alcohol withdrawal and currently sedated on the vent  1.Acute hypoxic respiratory failure with acute encephalopathy with agitation  -Secondary to pneumonia. -Patient went into alcohol withdrawal requiring several sedatives and subsequently intubated for hypoxic respiratory failure and airway protection on 06/10/2019 -Patient is extubated on 06/20/2019.  Weaned off Precedex drip this morning.  Still monitored carefully as patient could have agitated episodes.  Has a sitter at  bedside -Remains n.p.o. for now.  If mentation improves further, can be started on a diet after speech therapy  evaluation  2.Right lower extremity ischemia -status post revascularization with angioplasty and stent placement by vascular surgery in right superficial femoral artery, right external iliac artery. -Continue aspirin and Plavix  3.Right ankle fracture. Status post fall Patient status post open reduction with internal fixation of right ankle by orthopedic physician. Day 16 postop -Patient will need physical therapy -On Suboxone as well  4.History of schizoaffective bipolar type -Being followed by psychiatry service and managing psych meds.   -also on IV Depacon for mood stabilization.  5.Alcohol abuse now with delirium tremens -Off Precedex drip this morning Continue thiamine,multivitamin and folic acid. -On Suboxone  6.  Klebsiella/stenotrophomonas pneumonia.  Patient was on Unasyn initially. -Based on sensitivities, currently on IV Bactrim-day 6 of 7-day therapy -On low-dose Levophed   7. DVT prophylaxis;Lovenox  Discussed with ICU attending   All the records are reviewed and case discussed with Care Management/Social Workerr. Management plans discussed with the patient, family and they are in agreement.  CODE STATUS: Full code  TOTAL TIME TAKING CARE OF THIS PATIENT: 33 minutes.   POSSIBLE D/C IN ? DAYS, DEPENDING ON CLINICAL CONDITION.   Gladstone Lighter M.D on 06/24/2019 at 10:20 AM  Between 7am to 6pm - Pager - [phone]  After 6pm go to [email or web address] - password [email or web address] [facility]  Office  [phone]  CC: Primary care physician; Patient, [name]

## 2019-06-24 NOTE — Progress Notes (Signed)
CRITICAL CARE NOTE Thomas [name] is an 53 y.o. male with multiple comorbidities to include bipolar disorder and schizophrenia, seizure disorder, major depressive disorder with psychotic episodes and alcohol abuse presented with right ankle pain found to have a fracture and also found to have critical limb ischemia of the right lower extremity.  Patient had to have stents placed for his limb ischemia, post operatively he was too combative and require intubation and sedation for management.     CC  Follow up resp failure   SUBJECTIVE S/p extubation Remains critically  High risk for aspiration fio2 down to 45% More calm today   BP (!) 149/80   Pulse (!) 121   Temp 99.2 F (37.3 C) (Oral)   Resp 16   Ht 6' 0.01" (1.829 m)   Wt 71 kg   SpO2 95%   BMI 21.22 kg/m    I/O last 3 completed shifts: In: 5359.1 [I.V.:2070.9; IV Piggyback:3288.2] Out: 4675 [Urine:4675] No intake/output data recorded.  SpO2: 95 % O2 Flow Rate (L/min): 40 L/min FiO2 (%): 48 %  PICC Double Lumen 06/12/19 PICC Right Brachial 44 cm 2 cm 9 days [REMOVED] Peripheral IV 06/11/19 Posterior;Proximal;Left Forearm 4 days [REMOVED] Peripheral IV 06/07/19 Right Forearm 3 days [REMOVED] Peripheral IV 06/10/19 Left Wrist 2 days [REMOVED] Peripheral IV 06/07/19 Left Wrist 1 day [REMOVED] Peripheral IV 06/10/19 Right    Events: 9/24- Presented to ED with displaced right ankle fracture and limb ischemia 9/24- Vascular surgery to revascularize right lower extremity 9/25- Open reduction internal fixation of right ankle fracture medially and laterally 9/26- patient on precedex and multiple PRN IV meds still tachycardic with episodes of aggitation and combative/aggresive behavior. Concern for loss of airway protection 9/27- patient attempting to physically attack staff and loudly verbally abusive, he is in high doses of IV sedation. After additional PRN IV medications patient temporarilly sedated with  episodic apnea. Pt sedated and intubated. 9/28 failed SAT due to severe agitation and delirium 9/29PICC line placed 9/30 failed SAT again for severe agitation and delirium 9/30 CXR suspicious for right lower lobe pneumonia, began treatment with Unasyn 10/2Pt failed spontaneous breathing trial with sedation 10/3 remains on the ventilator family and do so informed ICU MD with regards to seizure and psychiatric issues recently. 10/4 updated mother at bedside.  Instituted changes to sedatives. 10/6 Started SBT 10/6 changed Levaquin to Bactrim due to Stenotrophomonas/Klebsiella 10/7 extubated 10/8 severe hypoxia 10/9 high risk for aspiration 10/11 more alert and awake   Review of Systems: Poor insight More calm, disoriented Other:  All other systems negative  Physical Examination:   GENERAL:ill appearing HEAD: Normocephalic, atraumatic.  EYES: PERLA, EOMI No scleral icterus.  MOUTH: Moist mucosal membrane.  EAR, NOSE, THROAT: Clear without exudates. No external lesions.  NECK: Supple. No thyromegaly.  No JVD.  PULMONARY: CTA B/L no wheezing, rhonchi, crackles CARDIOVASCULAR: S1 and S2. Regular rate and rhythm. No murmurs GASTROINTESTINAL: Soft, nontender, nondistended. Positive bowel sounds.  MUSCULOSKELETAL: No swelling, clubbing, or edema.  NEUROLOGIC: No gross focal neurological deficits. 5/5 strength all extremities SKIN: No ulceration, lesions, rashes, or cyanosis.  PSYCHIATRIC: Insight, judgment intact. -depression -anxiety ALL OTHER ROS ARE NEGATIVE       CULTURE RESULTS   Recent Results (from the past 240 hour(s))  C difficile quick scan w PCR reflex     Status: None   Collection Time: 06/19/19  8:52 AM   Specimen: STOOL  Result Value Ref Range Status   C Diff antigen NEGATIVE NEGATIVE  Final   C Diff toxin NEGATIVE NEGATIVE Final   C Diff interpretation No C. difficile detected.  Final    Comment: Performed at [facility], [address]  [place], [place] [address]          ASSESSMENT AND PLAN SYNOPSIS  Severe ACUTE Hypoxic and Hypercapnic Respiratory Failure from pneumonia High risk for aspiration  Wean fio2 as tolerated   Severe underlying schizophrenia bipolar disorder and major depressive disorder with acute psychosis and delirium continue depakote for mood stabilization Stop thorazine Off precedex  INFECTIOUS DISEASE -continue antibiotics as prescribed Klebsiella and stenotrophomonas pneumonia Continue Bactrim  NPO status  ELECTROLYTES -follow labs as needed -replace as needed -pharmacy consultation and following   DVT/GI PRX ordered TRANSFUSIONS AS NEEDED MONITOR FSBS ASSESS the need for LABS as needed   SD status  Corrin Parker, M.D.  [facility]  Medical Director [place] Director [facility] Cardio-Pulmonary Department

## 2019-06-24 NOTE — Progress Notes (Signed)
Patient noted very agitated and restless. HR elevated upto the 130s. PRN ativan, dialudid, and metoprolol administered. HR noted to decrease to the 90s. Agitation and restlessness unchanged. 1:1 siter at bedside. Will continue to monitor and endorse.

## 2019-06-24 NOTE — Progress Notes (Signed)
Patient seen and evaluated in person by this provider.  Sleeping soundly while lying on his bed.  Sitter reports no agitation even during ADL care.  Nurse came to discuss the patient.  She reports he has been off Precedex since yesterday along with hand mitts.  Maintaining at this time.  Recommend Toradol versus Dilaudid for pain related to the use of Suboxone.  Sherlie Ban, [facility] NP

## 2019-06-24 NOTE — Consult Note (Signed)
Pharmacy Antibiotic Note  Thomas [name] is a 53 y.o. male with history of psychiatric disorders and polysubstance abuse admitted on 06/07/2019 with pneumonia. Patient presented initially for peripheral revascularization of ischemic limb and ORIF of right ankle fracture. Post-operatively he became combative toward staff and was subsequently intubated. He has required high doses of sedatives this admission and each attempt to wean has resulted in further agitation and combativeness. Patient has completed a course of Unasyn this admission due to c/f aspiration pneumonia. Both bacteria growing in tracheal aspirate are susceptible to Bactrim. Bactrim is the therapy of choice for Stenotrophomonas due to reliable in vitro activity. Pharmacy has been consulted for sulfamethoxazole-trimethoprim dosing.   10/7 CXR significant for slight progression of hazy infiltrate in right lung base and new hazy infiltrate in left lung base. Patient with mild fever this morning.  WBC have been trending up with a slight left shift noted on differential. Fevers appear to be resolving. Pt was extubated. Remains NPO.   10/9: Patient has remained afebrile over past 24 hours.  He was extubated on 10/7, but remains agitated.  WBCs are beginning to trend downward.  Plan: Sulfamethoxazole-trimethoprim 320 mg IV q8h (12.5 mg/kg trimethoprim component)  Today is day 6 of therapy.   Recommended duration of therapy is 7 days for stenotrophomonas pneumonia. Continue to monitor for adverse effects and for signs of clinical improvement.   Height: 6' 0.01" (182.9 cm) Weight: 156 lb 8.4 oz (71 kg) IBW/kg (Calculated) : 77.62  Temp (24hrs), Avg:99.8 F (37.7 C), Min:97.9 F (36.6 C), Max:101.6 F (38.7 C)  Recent Labs  Lab 06/19/19 0505 06/20/19 0446 06/21/19 0425 06/22/19 0315 06/23/19 0325 06/24/19 0542  WBC 8.6 11.1* 14.0* 12.0* 10.1  --   CREATININE 0.63 0.65 0.68 0.60* 0.64 0.61    Estimated Creatinine Clearance:  107.2 mL/min (by C-G formula based on SCr of 0.61 mg/dL).    Allergies  Allergen Reactions  . Haldol [Haloperidol Lactate]     Antimicrobials this admission: Unasyn 9/30 >> 10/3 Levofloxacin 10/3 x1  Bactrim 10/6 >>  Microbiology results: 10/6 C diff: negative 9/30 Tracheal aspirate: Klebsiella oxytoca, Stenotrophomonas maltophilia 9/25 MRSA PCR: negative 9/24 COVID 19: negative  Thank you for allowing pharmacy to be a part of this patient's care.  [name] , PharmD, BCPS 06/24/2019 8:11 AM

## 2019-06-24 NOTE — Plan of Care (Signed)
Patient has s/s of delirium which is impairing his cognitive function.

## 2019-06-25 ENCOUNTER — Inpatient Hospital Stay: Payer: Medicaid Other

## 2019-06-25 LAB — URINE CULTURE: Culture: NO GROWTH

## 2019-06-25 LAB — BASIC METABOLIC PANEL
Anion gap: 10 (ref 5–15)
BUN: 8 mg/dL (ref 6–20)
CO2: 25 mmol/L (ref 22–32)
Calcium: 8.3 mg/dL — ABNORMAL LOW (ref 8.9–10.3)
Chloride: 101 mmol/L (ref 98–111)
Creatinine, Ser: 0.61 mg/dL (ref 0.61–1.24)
GFR calc Af Amer: >60 mL/min (ref >60)
GFR calc non Af Amer: >60 mL/min (ref >60)
Glucose, Bld: 351 mg/dL — ABNORMAL HIGH (ref 70–99)
Potassium: 3.5 mmol/L (ref 3.5–5.1)
Sodium: 136 mmol/L (ref 135–145)

## 2019-06-25 LAB — PHOSPHORUS: Phosphorus: 5.1 mg/dL — ABNORMAL HIGH (ref 2.5–4.6)

## 2019-06-25 LAB — GLUCOSE, CAPILLARY
Glucose-Capillary: 100 mg/dL — ABNORMAL HIGH (ref 70–99)
Glucose-Capillary: 71 mg/dL (ref 70–99)
Glucose-Capillary: 86 mg/dL (ref 70–99)
Glucose-Capillary: 95 mg/dL (ref 70–99)
Glucose-Capillary: 96 mg/dL (ref 70–99)

## 2019-06-25 LAB — PROCALCITONIN: Procalcitonin: 0.46 ng/mL

## 2019-06-25 LAB — CK: Total CK: 28 U/L — ABNORMAL LOW (ref 49–397)

## 2019-06-25 LAB — MAGNESIUM: Magnesium: 2 mg/dL (ref 1.7–2.4)

## 2019-06-25 MED ORDER — CHLORHEXIDINE GLUCONATE 0.12 % MT SOLN
OROMUCOSAL | Status: AC
  Filled 2019-06-25: qty 15

## 2019-06-25 MED ORDER — SODIUM CHLORIDE 0.9 % IV SOLN
3.0000 g | Freq: Four times a day (QID) | INTRAVENOUS | Status: DC
  Administered 2019-06-25 – 2019-06-29 (×16): 3 g via INTRAVENOUS
  Filled 2019-06-25 (×3): qty 3
  Filled 2019-06-25: qty 8
  Filled 2019-06-25 (×2): qty 3
  Filled 2019-06-25 (×3): qty 8
  Filled 2019-06-25 (×2): qty 3
  Filled 2019-06-25: qty 8
  Filled 2019-06-25 (×3): qty 3
  Filled 2019-06-25 (×2): qty 8
  Filled 2019-06-25: qty 3
  Filled 2019-06-25: qty 8
  Filled 2019-06-25: qty 3

## 2019-06-25 NOTE — Progress Notes (Addendum)
Shift summary:  - OOB to chair this AM. Standing with 1-2 person assisting. Not following instructions RE RLE.  - ST eval today.   - Patient refused 4pm CBG check.

## 2019-06-25 NOTE — Evaluation (Signed)
Clinical/Bedside Swallow Evaluation Patient Details  Name: Thomas [name] MRN: [id number] Date of Birth: 01-Jan-1966  Today's Date: 06/25/2019 Time: SLP Start Time (ACUTE ONLY): 1045 SLP Stop Time (ACUTE ONLY): 1145 SLP Time Calculation (min) (ACUTE ONLY): 60 min  Past Medical History:  Past Medical History:  Diagnosis Date  . Alcohol abuse   . Anemia   . Bipolar 1 disorder (HCC)   . Cervical spine fracture (HCC)   . Chronic, continuous use of opioids   . History of seizure   . HTN (hypertension)   . Insomnia   . MDD (major depressive disorder), recurrent, severe, with psychosis (HCC)   . Pancreatitis    Past Surgical History:  Past Surgical History:  Procedure Laterality Date  . LOWER EXTREMITY ANGIOGRAPHY Right 06/07/2019   Procedure: Lower Extremity Angiography;  Surgeon: Renford Dills, MD;  Location: [facility] INVASIVE CV LAB;  Service: Cardiovascular;  Laterality: Right;  . ORIF ANKLE FRACTURE Right 06/08/2019   Procedure: OPEN REDUCTION INTERNAL FIXATION (ORIF) ANKLE FRACTURE;  Surgeon: Deeann Saint, MD;  Location: [facility] ORS;  Service: Orthopedics;  Laterality: Right;   HPI:  Pt is a 53 yo male with an extensive psychiatric history and polysubstance abuse presented to the ED following a fall at home with right ankle fracture and right lower extremity ischemia and inability to locate pulse with doppler. After admission to the ICU, pt began exhibiting sx of drug/alchol withdrawal syndrome requiring multiple sedatives and leading to respiratory failure requiring intubation 9/27.CXR suspicious for right lower lobe pneumonia. seizures few days prior to this admissions due to alcohol withdrawal, they also report previous admission to [facility] for over 2 months due to refractory seizures with severe delirium tremens resulting in fall and cervical neck fracture status post neurosurgical intervention.  Pt failed SBT several attempts but finally extubated on 06/22/2019 and is currenlty on [place]  O2 support.    Assessment / Plan / Recommendation Clinical Impression  Pt appeared to present w/ Mild oropharyngeal phase dysphagia w/ the trials assessed at this evalution today. Pt was not given trials of solid foods d/t declined Cognitive status and Edentulous status at the time. Pt required Mod-MAX. verbal/tactile cues for follow through w/ aspiration precautions and was given frequent reminders to attend to oral intake --- suspect impact from Cognitive decline. Pt does present w/ increased risk for aspiration w/ oral intake at this time.  Pt consumed trials of ice chips and Nectar consistency liquids via Cup then straw w/ no immediate, overt s/s of aspiration; no decline in vocal quality or respiratory status during/post trials. O2 sats remained in upper 90s, no decline in RR/HR. Oral phase grossly [facility] for bolus management and A-P transfer w/ trials of Nectar liquids and purees; min decreased attention to the ice chip trials w/ reduced lingual manipulation. Also noted decreased labial closure on spoon to pull from it during food trials. No trials of solids were assessed at this eval d/t edentulous status and Cognitive status decline. Oral clearing noted b/t all trials. OM exam revealed no unilateral weakness/asymmetry; no anterior leakage. Speech intelligible at times; often mumbled/muttered speech -- pt was confused thinking his Mother was in the room; talked about having children, "it's going to be a boy or a girl". Pt required full feeding Supervision and support d/t weakness and Cognitive decline(inattention to task). Recommend a dysphagia level 1 (Puree) diet w/ Nectar consistency liquids -- monitor for any impulsive drinking behaviors; aspiration precautions; Pills Crushed in Puree for safer swallowing; feeding support/supervision.  ST services to f/u w/ pt's toleration of diet, trials to upgrade. Pt will need f/u for Cognitive-linguistic evaluation/assessment post discharge. MD updated on evaluation  findings.  SLP Visit Diagnosis: Dysphagia, oropharyngeal phase (R13.12)(impacted by Cognitive status decline)    Aspiration Risk  Mild aspiration risk;Risk for inadequate nutrition/hydration    Diet Recommendation  Dysphagia level 1 (puree) w/ NECTAR consistency liquids; aspiration precautions; feeding support and Supervision at all meals d/t Cognitive status decline  Medication Administration: Crushed with puree(for safer swallowing)    Other  Recommendations Recommended Consults: (Dietician f/u) Oral Care Recommendations: Oral care BID;Staff/trained caregiver to provide oral care Other Recommendations: Order thickener from pharmacy;Prohibited food (jello, ice cream, thin soups);Remove water pitcher;Have oral suction available   Follow up Recommendations Skilled Nursing facility(TBD)      Frequency and Duration min 3x week  2 weeks       Prognosis Prognosis for Safe Diet Advancement: Fair Barriers to Reach Goals: Cognitive deficits;Time post onset;Severity of deficits;Behavior      Swallow Study   General Date of Onset: 06/07/19 HPI: Pt is a 53 yo male with an extensive psychiatric history and polysubstance abuse presented to the ED following a fall at home with right ankle fracture and right lower extremity ischemia and inability to locate pulse with doppler. After admission to the ICU, pt began exhibiting sx of drug/alchol withdrawal syndrome requiring multiple sedatives and leading to respiratory failure requiring intubation 9/27.CXR suspicious for right lower lobe pneumonia. seizures few days prior to this admissions due to alcohol withdrawal, they also report previous admission to [facility] for over 2 months due to refractory seizures with severe delirium tremens resulting in fall and cervical neck fracture status post neurosurgical intervention.  Pt failed SBT several attempts but finally extubated on 06/22/2019 and is currenlty on [place] O2 support.  Type of Study: Bedside Swallow  Evaluation Previous Swallow Assessment: none reported Diet Prior to this Study: NPO Temperature Spikes Noted: No(wbc 10.1) Respiratory Status: Nasal cannula(4 liters) History of Recent Intubation: Yes Length of Intubations (days): 15 days Date extubated: 06/22/19 Oral Cavity Assessment: Dry Oral Care Completed by SLP: Recent completion by staff Oral Cavity - Dentition: Edentulous(has dentures but not in place) Vision: Impaired for self-feeding(unable to feed self) Self-Feeding Abilities: Total assist Patient Positioning: Upright in chair(needed further support w/ pillows) Baseline Vocal Quality: Low vocal intensity(mumbled speech) Volitional Cough: Cognitively unable to elicit Volitional Swallow: Unable to elicit    Oral/Motor/Sensory Function Overall Oral Motor/Sensory Function: Generalized oral weakness(decreased labial closure -- Cognitive decline/impact) Facial Symmetry: Within Functional Limits Lingual Symmetry: Within Functional Limits(when opening mouth)   Ice Chips Ice chips: Impaired(min) Presentation: Spoon(fed; 5 trials) Oral Phase Impairments: Poor awareness of bolus Oral Phase Functional Implications: Prolonged oral transit Pharyngeal Phase Impairments: (none) Other Comments: w/ cues, pt attended to the trials adequately   Thin Liquid Thin Liquid: Not tested Other Comments: d/t Cognitive decline    Nectar Thick Nectar Thick Liquid: Within functional limits Presentation: Cup;Straw;Spoon(~4 ozs total) Other Comments: pt attempted to hold cup   Honey Thick Honey Thick Liquid: Not tested   Puree Puree: Within functional limits(grossly) Presentation: Spoon(fed; 10 trials) Other Comments: min decreased labial tone/attention to pull from spoon   Solid     Solid: Not tested Other Comments: d/t Cognitive decline       [name], MS, CCC-SLP Jove Beyl 06/25/2019,3:11 PM

## 2019-06-25 NOTE — Consult Note (Signed)
Patient seen. No longer intubated. Patient was however, very confused, thought to be delirious at this time. Patient had recent swallow eval. Unclear at this time if patient can reliably tolerate PO medications. Psychiatry will continue to assist with transition to PO medications.  Continue with IV Valproate TID- can be changed to 500 mg PO TID once able to tolerate PO medications  -Check Valproate level -Check LFTs   -Continue with IV Lorazepam PRN anxiety/agitation  -Hold Suboxone if giving opiate medications  -Patient should be started on antipsychotic medications when no longer delirious Can start at Olanzapine 5mg  PO qhs If unable to take PO medications, can use IV thorazine 50mg  QHS

## 2019-06-25 NOTE — Consult Note (Signed)
PHARMACY CONSULT NOTE - FOLLOW UP  Pharmacy Consult for Electrolyte Monitoring and Replacement   53 y/o M admitted with right lower extremity ischemia and right ankle fracture now s/p peripheral revascularization and ORIF. Patient has a history significant for schizophrenia and substance abuse. Post-operatively he became extremely agitated likely secondary to drug/alcohol withdrawal requiring high doses of sedatives. He is currently intubated and sedated in the ICU.   Update 10/7: Patient extubated to [facility]NC  Recent Labs: Potassium (mmol/L)  Date Value  06/25/2019 3.5   Magnesium (mg/dL)  Date Value  06/25/2019 2.0   Calcium (mg/dL)  Date Value  06/25/2019 8.3 (L)   Albumin (g/dL)  Date Value  06/18/2019 2.8 (L)   Phosphorus (mg/dL)  Date Value  06/25/2019 5.1 (H)   Sodium (mmol/L)  Date Value  06/25/2019 136   Corrected calcium: 9.3 mg/dL  Electrolytes: Patient has a history of alcohol abuse. Baseline nutritional status un-clear. On tube feeds. Goal of therapy to replete electrolytes to normal levels. Patient with good UOP.Sodium stable at low end of normal. Free water flushes were dc'ed on 10/7. Marland KitchenPhos is slightly elevated - messaged MD and plan is to monitor. Pt is on D5 w/ LR with stable renal function.   -No electrolyte supplementation indicated today   -D5LR continued at 24mL/hr   -BMP tomorrow AM  Constipation: Patient is having regular bowel movements. LBM 10/8. Constipating medications include hydromorphone, cyproheptadine. Rectal tube removed. C diff panel 10/6 negative. Ordered PRN senna.   Glucose: No baseline A1c in chart and no history of DM recorded. Patient is currently NPO. Medications that could contribute to development of new-onset diabetes include quetiapine. Glucoses within acceptable limits.     -Continue to monitor blood glucoses. Pt on dextrose w/ LR infusion. No DM agents.   Pharmacy will continue to monitor and adjust per consult.   Dallie Piles, PharmD 06/25/2019 8:01 AM

## 2019-06-25 NOTE — Plan of Care (Signed)

## 2019-06-25 NOTE — Progress Notes (Signed)
[place] [facility] [place] NAME: Thomas [name]    MR#:  [id number]  DATE OF BIRTH:  Oct 19, 1965  SUBJECTIVE:  CHIEF COMPLAINT:   Chief Complaint  Patient presents with  . Fall   -sitting in a recliner morning.  On 3 to 4 L oxygen via nasal cannula -Still very confused.  Off of all sedation  REVIEW OF SYSTEMS:  Review of Systems  Unable to perform ROS: Critical illness    DRUG ALLERGIES:   Allergies  Allergen Reactions  . Haldol [Haloperidol Lactate]     VITALS:  Blood pressure 103/74, pulse (!) 106, temperature 98.5 F (36.9 C), temperature source Oral, resp. rate 16, height 6' 0.01" (1.829 m), weight 70.1 kg, SpO2 98 %.  PHYSICAL EXAMINATION:  Physical Exam  GENERAL:  53 y.o.-year-old patient lying in the bed with no acute distress.  EYES: Pupils equal, round, reactive to light and accommodation. No scleral icterus. Extraocular muscles intact.  HEENT: Head atraumatic, normocephalic. Oropharynx and nasopharynx clear.  NECK:  Supple, no jugular venous distention. No thyroid enlargement, no tenderness.  LUNGS: Normal breath sounds bilaterally, no wheezing, rales,rhonchi or crepitation. No use of accessory muscles of respiration.  Decreased bibasilar breath sounds CARDIOVASCULAR: S1, S2 normal. No murmurs, rubs, or gallops.  ABDOMEN: Soft, nontender, nondistended. Bowel sounds present. No organomegaly or mass.  EXTREMITIES: No  cyanosis, or clubbing.  Right leg and ankle in a cast.   Good capillary refill is noted. NEUROLOGIC: sedated   PSYCHIATRIC: The patient is alert, but disoriented, calm but incomprehensible conversation  SKIN: Erythematous flaky rash noted on the face especially near the skin folds   LABORATORY PANEL:   CBC Recent Labs  Lab 06/23/19 0325  WBC 10.1  HGB 9.1*  HCT 27.2*  PLT 305   ------------------------------------------------------------------------------------------------------------------   Chemistries  Recent Labs  Lab 06/25/19 0431  NA 136  K 3.5  CL 101  CO2 25  GLUCOSE 351*  BUN 8  CREATININE 0.61  CALCIUM 8.3*  MG 2.0   ------------------------------------------------------------------------------------------------------------------  Cardiac Enzymes No results for input(s): TROPONINI in the last 168 hours. ------------------------------------------------------------------------------------------------------------------  RADIOLOGY:  No results found.  EKG:   Orders placed or performed during the hospital encounter of 06/07/19  . EKG 12-Lead  . EKG 12-Lead    ASSESSMENT AND PLAN:   JamesRhodesis a1 y.o.malewith a known history of polysubstance abuse including alcohol,schizoaffective bipolar type,seizures,hypertension and insomnia who initially presented on 06/07/2019 following an episode of a fall. Was evaluated in the emergency room and found to have right ankle fracture as well as right foot ischemia.Went into alcohol withdrawal and currently sedated on the vent  1.Acute hypoxic respiratory failure with acute encephalopathy with agitation  -Secondary to pneumonia. -Patient went into alcohol withdrawal requiring several sedatives and subsequently intubated for hypoxic respiratory failure and airway protection on 06/10/2019 -Patient is extubated on 06/20/2019.  Weaned off Precedex drip .  Still monitored carefully as patient could have agitated episodes.  Has a sitter at  bedside -Remains n.p.o. for now.  Speech therapy consult today for swallow evaluation  2.Right lower extremity ischemia -status post revascularization with angioplasty and stent placement by vascular surgery in right superficial femoral artery, right external iliac artery. -Continue aspirin and Plavix  3.Right ankle fracture. Status post fall Patient status post open reduction with internal fixation of right ankle by orthopedic physician. Day 17 postop -Patient will  need physical therapy -On Suboxone as well  4.History of schizoaffective bipolar  type -Being followed by psychiatry service and managing psych meds.   -also on IV Depacon for mood stabilization. Can be changed to oral medications once able to tolerate p.o.  5.Alcohol abuse now with delirium tremens -Off Precedex drip now Continue thiamine,multivitamin and folic acid. -Sitter at bedside  6.  Klebsiella/stenotrophomonas pneumonia.  Patient was on Unasyn initially. -Based on sensitivities, finished IV Bactrim-today's last day of therapy -Off low-dose Levophed   7. DVT prophylaxis;Lovenox  Physical therapy consult and speech consult today Getting close to be transferred to floor   All the records are reviewed and case discussed with Care Management/Social Workerr. Management plans discussed with the patient, family and they are in agreement.  CODE STATUS: Full code  TOTAL TIME TAKING CARE OF THIS PATIENT: 34 minutes.   POSSIBLE D/C IN ? DAYS, DEPENDING ON CLINICAL CONDITION.   [name] M.D on 06/25/2019 at 10:43 AM  Between 7am to 6pm - Pager - [phone]  After 6pm go to [email or web address] - password [email or web address]  [facility]  Office  [phone]  CC: Primary care physician; Patient, [name]

## 2019-06-25 NOTE — Progress Notes (Signed)
CRITICAL CARE NOTE Thomas [name] is an 53 y.o. male with multiple comorbidities to include bipolar disorder and schizophrenia, seizure disorder, major depressive disorder with psychotic episodes and alcohol abuse presented with right ankle pain found to have a fracture and also found to have critical limb ischemia of the right lower extremity.  Patient had to have stents placed for his limb ischemia, post operatively he was too combative and require intubation and sedation for management.     CC  Follow up resp failure   SUBJECTIVE S/p extubation Remains critically  High risk for aspiration fio2 down to 45% More calm today   BP 103/74   Pulse (!) 106   Temp 98.5 F (36.9 C) (Oral)   Resp 16   Ht 6' 0.01" (1.829 m)   Wt 70.1 kg   SpO2 98%   BMI 20.96 kg/m    I/O last 3 completed shifts: In: 4765.8 [I.V.:1889.3; IV Piggyback:2876.4] Out: 2950 [Urine:2950] No intake/output data recorded.  SpO2: 98 % O2 Flow Rate (L/min): 3 L/min FiO2 (%): 48 %  PICC Double Lumen 06/12/19 PICC Right Brachial 44 cm 2 cm 9 days [REMOVED] Peripheral IV 06/11/19 Posterior;Proximal;Left Forearm 4 days [REMOVED] Peripheral IV 06/07/19 Right Forearm 3 days [REMOVED] Peripheral IV 06/10/19 Left Wrist 2 days [REMOVED] Peripheral IV 06/07/19 Left Wrist 1 day [REMOVED] Peripheral IV 06/10/19 Right    Events: 9/24- Presented to ED with displaced right ankle fracture and limb ischemia 9/24- Vascular surgery to revascularize right lower extremity 9/25- Open reduction internal fixation of right ankle fracture medially and laterally 9/26- patient on precedex and multiple PRN IV meds still tachycardic with episodes of aggitation and combative/aggresive behavior. Concern for loss of airway protection 9/27- patient attempting to physically attack staff and loudly verbally abusive, he is in high doses of IV sedation. After additional PRN IV medications patient temporarilly sedated with  episodic apnea. Pt sedated and intubated. 9/28 failed SAT due to severe agitation and delirium 9/29PICC line placed 9/30 failed SAT again for severe agitation and delirium 9/30 CXR suspicious for right lower lobe pneumonia, began treatment with Unasyn 10/2Pt failed spontaneous breathing trial with sedation 10/3 remains on the ventilator family and do so informed ICU MD with regards to seizure and psychiatric issues recently. 10/4 updated mother at bedside.  Instituted changes to sedatives. 10/6 Started SBT 10/6 changed Levaquin to Bactrim due to Stenotrophomonas/Klebsiella 10/7 extubated 10/8 severe hypoxia 10/9 high risk for aspiration 10/11 more alert and awake 10/12 optimizing for transfer to telemetry floow  Review of Systems: Poor insight More calm, disoriented Other:  All other systems negative  Physical Examination:   GENERAL: Chronically ill-appearing ill appearing HEAD: Normocephalic, atraumatic.  EYES: PERLA, EOMI No scleral icterus.  MOUTH: Moist mucosal membrane.  EAR, NOSE, THROAT: Clear without exudates. No external lesions.  NECK: Supple. No thyromegaly.  No JVD.  PULMONARY: CTA B/L no wheezing, rhonchi, crackles CARDIOVASCULAR: S1 and S2. Regular rate and rhythm. No murmurs GASTROINTESTINAL: Soft, nontender, nondistended. Positive bowel sounds.  MUSCULOSKELETAL: No swelling, clubbing, or edema.  NEUROLOGIC: CONFUSED  SKIN: No ulceration, lesions, rashes, or cyanosis.  PSYCHIATRIC: Insight, judgment intact. -depression -anxiety        CULTURE RESULTS   Recent Results (from the past 240 hour(s))  C difficile quick scan w PCR reflex     Status: None   Collection Time: 06/19/19  8:52 AM   Specimen: STOOL  Result Value Ref Range Status   C Diff antigen NEGATIVE NEGATIVE Final  C Diff toxin NEGATIVE NEGATIVE Final   C Diff interpretation No C. difficile detected.  Final    Comment: Performed at [facility], [address]  [place], [place] [address]  CULTURE, BLOOD (ROUTINE X 2) w Reflex to ID Panel     Status: None (Preliminary result)   Collection Time: 06/24/19  3:26 PM   Specimen: BLOOD  Result Value Ref Range Status   Specimen Description BLOOD BLOOD LEFT WRIST  Final   Special Requests   Final    BOTTLES DRAWN AEROBIC AND ANAEROBIC Blood Culture adequate volume   Culture   Final    NO GROWTH < 24 HOURS Performed at [facility], [address], [place], [place] [address]    Report Status PENDING  Incomplete  CULTURE, BLOOD (ROUTINE X 2) w Reflex to ID Panel     Status: None (Preliminary result)   Collection Time: 06/24/19  3:32 PM   Specimen: BLOOD  Result Value Ref Range Status   Specimen Description BLOOD LEFT ANTECUBITAL  Final   Special Requests   Final    BOTTLES DRAWN AEROBIC AND ANAEROBIC Blood Culture results may not be optimal due to an excessive volume of blood received in culture bottles   Culture   Final    NO GROWTH < 24 HOURS Performed at [facility], [address], [place], [place] [address]    Report Status PENDING  Incomplete          ASSESSMENT AND PLAN SYNOPSIS  Severe ACUTE Hypoxic and Hypercapnic Respiratory Failure from pneumonia High risk for aspiration  Wean fio2 as tolerated   Severe underlying schizophrenia bipolar disorder and major depressive disorder with acute psychosis and delirium continue depakote for mood stabilization Stop thorazine Off precedex  INFECTIOUS DISEASE -continue antibiotics as prescribed Klebsiella and stenotrophomonas pneumonia Continue Bactrim  NPO status  ELECTROLYTES -follow labs as needed -replace as needed -pharmacy consultation and following   DVT/GI PRX ordered TRANSFUSIONS AS NEEDED MONITOR FSBS ASSESS the need for LABS as needed       Ottie Glazier, M.D.  Pulmonary & [place]   Critical care provider statement:   Critical care time (minutes): 33   Critical care time was exclusive of: Separately billable procedures and  treating other patients  Critical care was necessary to treat or prevent imminent or  life-threatening deterioration of the following conditions: acute alcohol and drug withdrawal syndrome, bipolar disorder, critical limb ischemia or RLE, multple comorbid conditions.    Critical care was time spent personally by me on the following  activities: Development of treatment plan with patient or surrogate,  discussions with consultants, evaluation of patient's response to  treatment, examination of patient, obtaining history from patient or  surrogate, ordering and performing treatments and interventions, ordering  and review of laboratory studies and re-evaluation of patient's condition  I assumed direction of critical care for this patient from another  provider in my specialty: no

## 2019-06-25 NOTE — TOC Progression Note (Signed)
Transition of Care [facility]) - Progression Note    Patient Details  Name: Thomas [name] MRN: [id number] Date of Birth: 27-Nov-1965  Transition of Care [facility]) CM/SW [place], [place] Phone Number: 06/25/2019, 2:31 PM  Clinical Narrative:   CSW spoke with patient's mother today regarding discharge plan. Mother states that she would like for patient to return home at discharge if possible. She and patient's girlfriend would prefer for patient to go home. Mother states that per MD, patient is progressing well and oxygen requirement is coming down. Mother states that at this time she does not want to do a bed search and would like to discuss again closer to discharge. TOC team will continue to follow for discharge planning.     Expected Discharge Plan: [place] Barriers to Discharge: Continued Medical Work up  Expected Discharge Plan and Services Expected Discharge Plan: [place] arrangements for the past 2 months: Single Family Home                                       Social Determinants of Health (SDOH) Interventions    Readmission Risk Interventions No flowsheet data found.

## 2019-06-26 LAB — GLUCOSE, CAPILLARY
Glucose-Capillary: 75 mg/dL (ref 70–99)
Glucose-Capillary: 86 mg/dL (ref 70–99)
Glucose-Capillary: 87 mg/dL (ref 70–99)
Glucose-Capillary: 76 mg/dL (ref 70–99)
Glucose-Capillary: 82 mg/dL (ref 70–99)
Glucose-Capillary: 81 mg/dL (ref 70–99)

## 2019-06-26 LAB — BASIC METABOLIC PANEL
Anion gap: 11 (ref 5–15)
BUN: 12 mg/dL (ref 6–20)
CO2: 26 mmol/L (ref 22–32)
Calcium: 8.2 mg/dL — ABNORMAL LOW (ref 8.9–10.3)
Chloride: 100 mmol/L (ref 98–111)
Creatinine, Ser: 0.6 mg/dL — ABNORMAL LOW (ref 0.61–1.24)
GFR calc Af Amer: >60 mL/min (ref >60)
GFR calc non Af Amer: >60 mL/min (ref >60)
Glucose, Bld: 291 mg/dL — ABNORMAL HIGH (ref 70–99)
Potassium: 3.7 mmol/L (ref 3.5–5.1)
Sodium: 137 mmol/L (ref 135–145)

## 2019-06-26 LAB — CBC WITH DIFFERENTIAL/PLATELET
Abs Immature Granulocytes: 0.07 10*3/uL (ref 0.00–0.07)
Basophils Absolute: 0.1 10*3/uL (ref 0.0–0.1)
Basophils Relative: 1 %
Eosinophils Absolute: 0.5 10*3/uL (ref 0.0–0.5)
Eosinophils Relative: 7 %
HCT: 29 % — ABNORMAL LOW (ref 39.0–52.0)
Hemoglobin: 9.4 g/dL — ABNORMAL LOW (ref 13.0–17.0)
Immature Granulocytes: 1 %
Lymphocytes Relative: 19 %
Lymphs Abs: 1.4 10*3/uL (ref 0.7–4.0)
MCH: 33.1 pg (ref 26.0–34.0)
MCHC: 32.4 g/dL (ref 30.0–36.0)
MCV: 102.1 fL — ABNORMAL HIGH (ref 80.0–100.0)
Monocytes Absolute: 1 10*3/uL (ref 0.1–1.0)
Monocytes Relative: 15 %
Neutro Abs: 4.1 10*3/uL (ref 1.7–7.7)
Neutrophils Relative %: 57 %
Platelets: 344 10*3/uL (ref 150–400)
RBC: 2.84 MIL/uL — ABNORMAL LOW (ref 4.22–5.81)
RDW: 13.9 % (ref 11.5–15.5)
WBC: 7.1 10*3/uL (ref 4.0–10.5)
nRBC: 0 % (ref 0.0–0.2)

## 2019-06-26 LAB — PHOSPHORUS: Phosphorus: 3.3 mg/dL (ref 2.5–4.6)

## 2019-06-26 LAB — PROCALCITONIN: Procalcitonin: 0.29 ng/mL

## 2019-06-26 MED ORDER — NEPRO/CARBSTEADY PO LIQD
237.0000 mL | Freq: Two times a day (BID) | ORAL | Status: DC
  Administered 2019-06-28 – 2019-07-09 (×15): 237 mL via ORAL

## 2019-06-26 MED ORDER — VALPROATE SODIUM 500 MG/5ML IV SOLN
250.0000 mg | Freq: Three times a day (TID) | INTRAVENOUS | Status: DC
  Administered 2019-06-26 – 2019-07-04 (×22): 250 mg via INTRAVENOUS
  Filled 2019-06-26 (×28): qty 2.5

## 2019-06-26 MED ORDER — CHLORHEXIDINE GLUCONATE CLOTH 2 % EX PADS
6.0000 | MEDICATED_PAD | Freq: Every day | CUTANEOUS | Status: DC
  Administered 2019-06-26 – 2019-07-05 (×9): 6 via TOPICAL

## 2019-06-26 MED ORDER — SENNA 8.6 MG PO TABS
1.0000 | ORAL_TABLET | Freq: Every day | ORAL | Status: DC
  Administered 2019-06-26 – 2019-07-09 (×10): 8.6 mg via ORAL
  Filled 2019-06-26 (×12): qty 1

## 2019-06-26 MED ORDER — QUETIAPINE FUMARATE 25 MG PO TABS
25.0000 mg | ORAL_TABLET | Freq: Once | ORAL | Status: AC
  Administered 2019-06-26: 25 mg via ORAL
  Filled 2019-06-26: qty 1

## 2019-06-26 NOTE — Consult Note (Signed)
  Patient seen on the floors.  Patiently recently moved down to medical floors from ICU.  Interview was attempted however patient sat blank face looking at writer with his mouth open.  Did not respond to questions appropriately.  Did not meaningfully participate in conversations.  At this point, patient is not yet at his baseline.  Likely still delirious.  Continue to search for ulterior reasons  of altered mental status.  Continue with IV Valproate TID- can be changed to 500 mg PO TID once able to tolerate PO medications  -Check Valproate level -Check LFTs   -Continue with IV Lorazepam PRN anxiety/agitation  -Hold Suboxone if giving opiate medications  -Patient should be started on antipsychotic medications when no longer delirious Can start at Olanzapine 5mg  PO qhs If unable to take PO medications, can use IV thorazine 50mg  QHS    Psychiatry will continue to follow

## 2019-06-26 NOTE — TOC Progression Note (Signed)
Transition of Care [facility]) - Progression Note    Patient Details  Name: Thomas [name] MRN: [id number] Date of Birth: Sep 12, 1966  Transition of Care [facility]) CM/SW [place], RN Phone Number: 06/26/2019, 3:40 PM  Clinical Narrative:    Met with the patient today, he is awake but not alert.  I asked his name and he grunted.  I told him I would follow up to see if he needed equipment or if he needs to go to rehab. He said OK.  The patient is sitting up in bed with his mouth hanging open drooling.  He is not able to appropriately respond to questions. He is calm and not agitated at this time. Will continue to follow for needs   Expected Discharge Plan: [place] Barriers to Discharge: Continued Medical Work up  Expected Discharge Plan and Services Expected Discharge Plan: [place] arrangements for the past 2 months: Single Family Home                                       Social Determinants of Health (SDOH) Interventions    Readmission Risk Interventions No flowsheet data found.

## 2019-06-26 NOTE — Progress Notes (Signed)
[employer] - [place] at [facility]   PATIENT NAME: Thomas [name]    MR#:  [id number]  DATE OF BIRTH:  1966-04-06  SUBJECTIVE:  CHIEF COMPLAINT:   Chief Complaint  Patient presents with  . Fall   Sitting in the bed.  Confused.  On oxygen.  REVIEW OF SYSTEMS:  Review of Systems  Unable to perform ROS: Critical illness   DRUG ALLERGIES:   Allergies  Allergen Reactions  . Haldol [Haloperidol Lactate]    VITALS:  Blood pressure 98/75, pulse (!) 103, temperature 98.4 F (36.9 C), temperature source Oral, resp. rate 16, height 6' 0.01" (1.829 m), weight 68.8 kg, SpO2 98 %.  PHYSICAL EXAMINATION:  Physical Exam  GENERAL:  53 y.o.-year-old patient lying in the bed with no acute distress.  EYES: Pupils equal, round, reactive to light and accommodation. No scleral icterus. Extraocular muscles intact.  HEENT: Head atraumatic, normocephalic. Oropharynx and nasopharynx clear.  NECK:  Supple, no jugular venous distention. No thyroid enlargement, no tenderness.  LUNGS: Normal breath sounds bilaterally, no wheezing, rales,rhonchi or crepitation. No use of accessory muscles of respiration.  Decreased bibasilar breath sounds CARDIOVASCULAR: S1, S2 normal. No murmurs, rubs, or gallops.  ABDOMEN: Soft, nontender, nondistended. Bowel sounds present. No organomegaly or mass.  EXTREMITIES: No  cyanosis, or clubbing.  Right leg and ankle in a cast.   Good capillary refill is noted. NEUROLOGIC: sedated   PSYCHIATRIC: The patient is alert, but disoriented, calm but incomprehensible conversation  SKIN: Erythematous flaky rash noted on the face especially near the skin folds  LABORATORY PANEL:   CBC Recent Labs  Lab 06/26/19 0435  WBC 7.1  HGB 9.4*  HCT 29.0*  PLT 344   ------------------------------------------------------------------------------------------------------------------  Chemistries  Recent Labs  Lab 06/25/19 0431 06/26/19 0435  NA 136 137  K 3.5 3.7   CL 101 100  CO2 25 26  GLUCOSE 351* 291*  BUN 8 12  CREATININE 0.61 0.60*  CALCIUM 8.3* 8.2*  MG 2.0  --    ------------------------------------------------------------------------------------------------------------------  Cardiac Enzymes No results for input(s): TROPONINI in the last 168 hours. ------------------------------------------------------------------------------------------------------------------  RADIOLOGY:  US Venous Img Lower Bilateral  Result Date: 06/25/2019 CLINICAL DATA:  53 year old male with bilateral lower extremity pain and swelling. EXAM: BILATERAL LOWER EXTREMITY VENOUS DOPPLER ULTRASOUND TECHNIQUE: Gray-scale sonography with graded compression, as well as color Doppler and duplex ultrasound were performed to evaluate the lower extremity deep venous systems from the level of the common femoral vein and including the common femoral, femoral, profunda femoral, popliteal and calf veins including the posterior tibial, peroneal and gastrocnemius veins when visible. The superficial great saphenous vein was also interrogated. Spectral Doppler was utilized to evaluate flow at rest and with distal augmentation maneuvers in the common femoral, femoral and popliteal veins. COMPARISON:  None. FINDINGS: RIGHT LOWER EXTREMITY Common Femoral Vein: No evidence of thrombus. Normal compressibility, respiratory phasicity and response to augmentation. Saphenofemoral Junction: No evidence of thrombus. Normal compressibility and flow on color Doppler imaging. Profunda Femoral Vein: No evidence of thrombus. Normal compressibility and flow on color Doppler imaging. Femoral Vein: No evidence of thrombus. Normal compressibility, respiratory phasicity and response to augmentation. Popliteal Vein: No evidence of thrombus. Normal compressibility, respiratory phasicity and response to augmentation. Calf Veins: No evidence of thrombus. Normal compressibility and flow on color Doppler imaging.  Superficial Great Saphenous Vein: No evidence of thrombus. Normal compressibility. Venous Reflux:  None. Other Findings:  None. LEFT LOWER EXTREMITY Common Femoral Vein: No evidence of  thrombus. Normal compressibility, respiratory phasicity and response to augmentation. Saphenofemoral Junction: No evidence of thrombus. Normal compressibility and flow on color Doppler imaging. Profunda Femoral Vein: No evidence of thrombus. Normal compressibility and flow on color Doppler imaging. Femoral Vein: No evidence of thrombus. Normal compressibility, respiratory phasicity and response to augmentation. Popliteal Vein: No evidence of thrombus. Normal compressibility, respiratory phasicity and response to augmentation. Calf Veins: No evidence of thrombus. Normal compressibility and flow on color Doppler imaging. Superficial Great Saphenous Vein: No evidence of thrombus. Normal compressibility. Venous Reflux:  None. Other Findings:  None. IMPRESSION: No evidence of deep venous thrombosis in either lower extremity. Electronically Signed   By: Jacqulynn Cadet M.D.   On: 06/25/2019 14:41   Dg Chest Port 1 View  Result Date: 06/25/2019 CLINICAL DATA:  Pulmonary disease, current smoker. EXAM: PORTABLE CHEST 1 VIEW COMPARISON:  Chest radiograph dated 06/20/2019 FINDINGS: A right upper extremity peripherally inserted central venous catheter tip overlies the superior cavoatrial junction. An endotracheal tube and an enteric tube have been removed. Fixation hardware is seen in the cervical spine. The heart size and mediastinal contours are within normal limits. Patchy bilateral interstitial and airspace opacities have decreased on the right and are unchanged on the left. There is no pleural effusion or pneumothorax. IMPRESSION: Bilateral interstitial and airspace opacities have decreased on the right. Electronically Signed   By: Zerita Boers M.D.   On: 06/25/2019 11:37    EKG:   Orders placed or performed during the hospital  encounter of 06/07/19  . EKG 12-Lead  . EKG 12-Lead    ASSESSMENT AND PLAN:   JamesRhodesis a21 y.o.malewith a known history of polysubstance abuse including alcohol,schizoaffective bipolar type,seizures,hypertension and insomnia who initially presented on 06/07/2019 following an episode of a fall. Was evaluated in the emergency room and found to have right ankle fracture as well as right foot ischemia.Went into alcohol withdrawal  *Acute hypoxic respiratory failure secondary to pneumonia.  Possible aspiration.  Intubated for hypoxic respiratory failure and airway protection on 06/10/2019,  extubated on 06/20/2019. On nasal cannula. IV Unasyn.  Finished IV Bactrim Respiratory cultures with Klebsiella/stenotrophomonas   *Right lower extremity ischemia -status post revascularization with angioplasty and stent placement by vascular surgery in right superficial femoral artery, right external iliac artery. -Continue aspirin and Plavix  *Right ankle fracture. Status post fall Patient status post open reduction with internal fixation of right ankle by orthopedic physician. -Patient will need physical therapy -On Suboxone as well  *History of schizoaffective bipolar type -Being followed by psychiatry service and managing psych meds.   -Also on IV Depacon for mood stabilization. Can be changed to oral medications once able to tolerate p.o.  *Alcohol abuse with delirium tremens and now acute metabolic encephalopathy and inpatient delirium -Off Precedex drip  Continue thiamine,multivitamin and folic acid.  *. DVT prophylaxis;Lovenox  Physical therapy consult and speech consult   All the records are reviewed and case discussed with Care Management/Social Worker Management plans discussed with the patient, family and they are in agreement.  CODE STATUS: Full code  TOTAL TIME TAKING CARE OF THIS PATIENT: 34 minutes.   POSSIBLE D/C IN ? DAYS, DEPENDING ON  CLINICAL CONDITION.  Leia Alf Teresa Nicodemus M.D on 06/26/2019 at 11:56 AM  Between 7am to 6pm - Pager - [phone]  After 6pm go to [email or web address] - password [email or web address] [facility]  Office  [phone]  CC: Primary care physician; Patient, [name]

## 2019-06-26 NOTE — Consult Note (Signed)
PHARMACY CONSULT NOTE - FOLLOW UP  Pharmacy Consult for Electrolyte Monitoring and Replacement   53 y/o M admitted with right lower extremity ischemia and right ankle fracture now s/p peripheral revascularization and ORIF. Patient has a history significant for schizophrenia and substance abuse. Post-operatively he became extremely agitated likely secondary to drug/alcohol withdrawal requiring high doses of sedatives. He is currently intubated and sedated in the ICU.   Update 10/7: Patient extubated to HFNC  Recent Labs: Potassium (mmol/L)  Date Value  06/26/2019 3.7   Magnesium (mg/dL)  Date Value  06/25/2019 2.0   Calcium (mg/dL)  Date Value  06/26/2019 8.2 (L)   Albumin (g/dL)  Date Value  06/18/2019 2.8 (L)   Phosphorus (mg/dL)  Date Value  06/26/2019 3.3   Sodium (mmol/L)  Date Value  06/26/2019 137   Corrected calcium: 9.2 mg/dL  Electrolytes: Patient has a history of alcohol abuse. Baseline nutritional status un-clear. Goal of therapy to replete electrolytes to normal levels. Patient with good UOP.   -No electrolyte supplementation indicated today   -D5LR d/c   -BMP tomorrow AM  Constipation:  LBM 10/8. Constipating medications include hydromorphone, cyproheptadine. Rectal tube removed. C diff panel 10/6 negative. Ordered senna one tab daily  Glucose: No baseline A1c in chart and no history of DM recorded. Patient is currently eating a nectar-thick diet. Medications that could contribute to development of new-onset diabetes include quetiapine. There are 2 serum glucose levels that were elevated but drawn while D5LR was infusing. Finger sticks are within acceptable limits.     -Continue to monitor blood glucoses.  No DM agents.   Pharmacy will continue to monitor and adjust per consult.   Dallie Piles, PharmD 06/26/2019 2:18 PM

## 2019-06-26 NOTE — Progress Notes (Signed)
[facility] Daily Progress Note   Subjective: 18 Days Post-Op: 1. Introduction catheter intorightlower extremity 3rd order catheter placement  2.Contrast injectionrightlower extremity for distal runoff  3. Percutaneous transluminal angioplasty and stent placementrightsuperficial femoral artery  4. Percutaneous transluminal angioplasty and stent placement right external iliac artery 5.Star close closureleftcommon femoral arteriotomy  Patient extubated but lethargic this AM.  Objective: Vitals:   06/26/19 0700 06/26/19 0800 06/26/19 0900 06/26/19 1000  BP: (!) 103/55 108/88 108/81 98/75  Pulse: (!) 106 (!) 101 (!) 106 (!) 103  Resp:      Temp:  98.4 F (36.9 C)    TempSrc:  Oral    SpO2: 96% 90% 93% 98%  Weight:      Height:        Intake/Output Summary (Last 24 hours) at 06/26/2019 1142 Last data filed at 06/26/2019 1100 Gross per 24 hour  Intake 2564.59 ml  Output 1525 ml  Net 1039.59 ml   Physical Exam: Consfused, NAD CV: RRR Pulmonary: CTA Bilaterally Abdomen: Soft, Nontender, Nondistended Left Groin: Access site - clean, dry and intact Vascular: Right Lower Extremity: Thigh soft.Unable to access pedal pulses due to cast however toes are warm, pinkwith a good capillary refill.   Laboratory: CBC    Component Value Date/Time   WBC 7.1 06/26/2019 0435   HGB 9.4 (L) 06/26/2019 0435   HCT 29.0 (L) 06/26/2019 0435   PLT 344 06/26/2019 0435   BMET    Component Value Date/Time   NA 137 06/26/2019 0435   K 3.7 06/26/2019 0435   CL 100 06/26/2019 0435   CO2 26 06/26/2019 0435   GLUCOSE 291 (H) 06/26/2019 0435   BUN 12 06/26/2019 0435   CREATININE 0.60 (L) 06/26/2019 0435   CALCIUM 8.2 (L) 06/26/2019 0435   GFRNONAA >60 06/26/2019 0435   GFRAA >60 06/26/2019 0435   Assessment/Planning: The patient is a 53 year old male who presented with right  ankle fracture and right lower extremity ischemias/plower extremity revascularization and open reduction internal fixation ankle fracture with severe alcohol withdrawal now in DTs requiring sedation and subsequent intubation 1) Appreciate assistance in management from ICU,psychiatryand medicine 2) Successful right lower extremity revascularization - no further recommendation from vascular at this time. Will continue to monitor 3) On ASA / Plavix 4) Now extubated.   Seen and examined with Tamera Stands PA-C 06/26/2019 11:42 AM

## 2019-06-26 NOTE — Progress Notes (Signed)
PT Cancellation Note  Patient Details Name: Thomas [name] MRN: [id number] DOB: 1966-01-11   Cancelled Treatment:    Reason Eval/Treat Not Completed: Patient's level of consciousness Attempted to see pt earlier this afternoon, he was able to open his eyes, but could not state name, hold eye contact or verbalize more than grunting.  He was vaguely rubbing nose t/o our "discussion" and a purposeless and confused way.  Pt not appropriate for PT at this time, did assist with re-placing O2 nasal cannula.  Case manager attempt to see him later and reports that he was still very distant and confused.  Will attempt PT exam tomorrow as appropriate.   Pt was agitated the day after his ORIF and required intubation (9/27- 10/7), staff have been trying to wean his sedative meds since extubation but pt has remained confused and lethargic, though stable enough to transfer off CCU earlier today.  Kreg Shropshire, DPT 06/26/2019, 4:45 PM

## 2019-06-26 NOTE — Progress Notes (Signed)
CRITICAL CARE NOTE Thomas [name] is an 53 y.o. male with multiple comorbidities to include bipolar disorder and schizophrenia, seizure disorder, major depressive disorder with psychotic episodes and alcohol abuse presented with right ankle pain found to have a fracture and also found to have critical limb ischemia of the right lower extremity.  Patient had to have stents placed for his limb ischemia, post operatively he was too combative and require intubation and sedation for management.     CC  Follow up resp failure   SUBJECTIVE S/p extubation Remains critically  High risk for aspiration fio2 down to 45% More calm today   BP 98/75   Pulse (!) 103   Temp 98.4 F (36.9 C) (Oral)   Resp 16   Ht 6' 0.01" (1.829 m)   Wt 68.8 kg   SpO2 98%   BMI 20.57 kg/m    I/O last 3 completed shifts: In: 3990.2 [P.O.:360; I.V.:2015; IV Piggyback:1615.1] Out: 1925 [Urine:1925] No intake/output data recorded.  SpO2: 98 % O2 Flow Rate (L/min): 2 L/min FiO2 (%): 48 %  PICC Double Lumen 06/12/19 PICC Right Brachial 44 cm 2 cm 9 days [REMOVED] Peripheral IV 06/11/19 Posterior;Proximal;Left Forearm 4 days [REMOVED] Peripheral IV 06/07/19 Right Forearm 3 days [REMOVED] Peripheral IV 06/10/19 Left Wrist 2 days [REMOVED] Peripheral IV 06/07/19 Left Wrist 1 day [REMOVED] Peripheral IV 06/10/19 Right    Events: 9/24- Presented to ED with displaced right ankle fracture and limb ischemia 9/24- Vascular surgery to revascularize right lower extremity 9/25- Open reduction internal fixation of right ankle fracture medially and laterally 9/26- patient on precedex and multiple PRN IV meds still tachycardic with episodes of aggitation and combative/aggresive behavior. Concern for loss of airway protection 9/27- patient attempting to physically attack staff and loudly verbally abusive, he is in high doses of IV sedation. After additional PRN IV medications patient temporarilly sedated  with episodic apnea. Pt sedated and intubated. 9/28 failed SAT due to severe agitation and delirium 9/29PICC line placed 9/30 failed SAT again for severe agitation and delirium 9/30 CXR suspicious for right lower lobe pneumonia, began treatment with Unasyn 10/2Pt failed spontaneous breathing trial with sedation 10/3 remains on the ventilator family and do so informed ICU MD with regards to seizure and psychiatric issues recently. 10/4 updated mother at bedside.  Instituted changes to sedatives. 10/6 Started SBT 10/6 changed Levaquin to Bactrim due to Stenotrophomonas/Klebsiella 10/7 extubated 10/8 severe hypoxia 10/9 high risk for aspiration 10/11 more alert and awake 10/12 optimizing for transfer to telemetry floow 10/13 pt is improved clinically    Review of Systems: Poor insight More calm, disoriented Other:  All other systems negative  Physical Examination:   GENERAL: Chronically ill-appearing ill appearing HEAD: Normocephalic, atraumatic.  EYES: PERLA, EOMI No scleral icterus.  MOUTH: Moist mucosal membrane.  EAR, NOSE, THROAT: Clear without exudates. No external lesions.  NECK: Supple. No thyromegaly.  No JVD.  PULMONARY: CTA B/L no wheezing, rhonchi, crackles CARDIOVASCULAR: S1 and S2. Regular rate and rhythm. No murmurs GASTROINTESTINAL: Soft, nontender, nondistended. Positive bowel sounds.  MUSCULOSKELETAL: No swelling, clubbing, or edema.  NEUROLOGIC: calm and noncombative SKIN: No ulceration, lesions, rashes, or cyanosis.  PSYCHIATRIC: Insight, judgment intact. -depression -anxiety        CULTURE RESULTS   Recent Results (from the past 240 hour(s))  C difficile quick scan w PCR reflex     Status: None   Collection Time: 06/19/19  8:52 AM   Specimen: STOOL  Result Value Ref Range Status  C Diff antigen NEGATIVE NEGATIVE Final   C Diff toxin NEGATIVE NEGATIVE Final   C Diff interpretation No C. difficile detected.  Final    Comment: Performed at  [facility], [address] [place], [place] [address]  Urine Culture     Status: None   Collection Time: 06/24/19  2:58 PM   Specimen: Urine, Random  Result Value Ref Range Status   Specimen Description   Final    URINE, RANDOM Performed at [facility], [address] [place], [place] [address]    Special Requests   Final    NONE Performed at [facility], [address] [place], [place] [address]    Culture   Final    NO GROWTH Performed at [facility], [address] [place], [place] [address]    Report Status 06/25/2019 FINAL  Final  CULTURE, BLOOD (ROUTINE X 2) w Reflex to ID Panel     Status: None (Preliminary result)   Collection Time: 06/24/19  3:26 PM   Specimen: BLOOD  Result Value Ref Range Status   Specimen Description BLOOD BLOOD LEFT WRIST  Final   Special Requests   Final    BOTTLES DRAWN AEROBIC AND ANAEROBIC Blood Culture adequate volume   Culture   Final    NO GROWTH 2 DAYS Performed at [facility], [address] [place], [place] [address]    Report Status PENDING  Incomplete  CULTURE, BLOOD (ROUTINE X 2) w Reflex to ID Panel     Status: None (Preliminary result)   Collection Time: 06/24/19  3:32 PM   Specimen: BLOOD  Result Value Ref Range Status   Specimen Description BLOOD LEFT ANTECUBITAL  Final   Special Requests   Final    BOTTLES DRAWN AEROBIC AND ANAEROBIC Blood Culture results may not be optimal due to an excessive volume of blood received in culture bottles   Culture   Final    NO GROWTH 2 DAYS Performed at [facility], [address] [place], [place] [address]    Report Status PENDING  Incomplete          ASSESSMENT AND PLAN SYNOPSIS  Severe ACUTE Hypoxic and Hypercapnic Respiratory Failure from pneumonia High risk for aspiration  Wean fio2 as tolerated   Severe underlying schizophrenia bipolar disorder and major depressive disorder with acute psychosis and  delirium continue depakote for mood stabilization Stop thorazine Off precedex  INFECTIOUS DISEASE -continue antibiotics as prescribed Klebsiella and stenotrophomonas pneumonia Continue Bactrim  NPO status  ELECTROLYTES -follow labs as needed -replace as needed -pharmacy consultation and following   DVT/GI PRX ordered TRANSFUSIONS AS NEEDED MONITOR FSBS ASSESS the need for LABS as needed       Vida RiggerFuad Malone Vanblarcom, M.D.  Pulmonary & Critical Care Medicine  [facility]   Critical care provider statement:   Critical care time (minutes): 33  Critical care time was exclusive of: Separately billable procedures and  treating other patients  Critical care was necessary to treat or prevent imminent or  life-threatening deterioration of the following conditions: acute alcohol and drug withdrawal syndrome, bipolar disorder, critical limb ischemia or RLE, multple comorbid conditions.    Critical care was time spent personally by me on the following  activities: Development of treatment plan with patient or surrogate,  discussions with consultants, evaluation of patient's response to  treatment, examination of patient, obtaining history from patient or  surrogate, ordering and performing treatments and  interventions, ordering  and review of laboratory studies and re-evaluation of patient's condition  I assumed direction of critical care for this patient from another  provider in my specialty: no

## 2019-06-26 NOTE — Progress Notes (Signed)
Report given to RN Raquel Sarna, for patient to be transferred to room 150. Patients mother contacted about new room number and being transferred.

## 2019-06-26 NOTE — Progress Notes (Signed)
Speech Language Pathology Treatment: Dysphagia  Patient Details Name: Thomas [name] MRN: [id number] DOB: 04-20-1966 Today's Date: 06/26/2019 Time: 0263-7858 SLP Time Calculation (min) (ACUTE ONLY): 32 min  Assessment / Plan / Recommendation Clinical Impression  Pt seen for ongoing assessment of toleration of diet; trials to upgrade diet if appropriate, safe for pt. Noted upon entering room, pt was drowsy w/ continued confusion and Cognitive decline requiring Mod verbal/tactile cues for follow through w/ po tasks, self-feeding(holding the cup to drink). Pt has done well tolerating the recommended modified diet (puree w/ Nectar liquids) so far; NSG stated pt ate "well" at his meals today. Due to pt's alertness and engagement w/ SLP(confusion), trials to upgrade diet consistency were not attempted d/t risk for aspiration at this time. Pt appeared to present w/ adequate oropharyngeal phase swallowing function w/ theNectar liquid trials via Cup then Straw. Pt required help/support to hold Cup but then consumed trials of Nectar liquids w/ no overt s/s of aspiration noted; no decline in O2 sats or vocal quality noted during/post trials. Pt exhibited min decreased awareness during the oral prep stage of accepting cup/straw then organizing sucking initially. Pt required Mod. verbal/tactile cues for follow through w/ aspiration precautions during the po trials and was given frequent reminders to slow down when drinking the liquids --- suspect impact from Cognitive decline, illness. Pt does present w/ increased risk for aspiration w/ oral intake at this time d/t Cognitive status/decline; Supervision during all oral intake and holding po's if too drowsy or inattentive is recommended. Speech mumbled. Pt required full feeding Supervision and support.   Recommend continue a dysphagia level 1 (Puree) diet w/ Nectar consistency liquids via Cup/Straw at this time w/ ongoing assessment of pt's status  medical/cognitive status'; aspiration precautions; Pills in Puree for safer swallowing. NSG updated on findings.     HPI HPI: Pt is a 53 yo male with an extensive psychiatric history and polysubstance abuse presented to the ED following a fall at home with right ankle fracture and right lower extremity ischemia and inability to locate pulse with doppler. After admission to the ICU, pt began exhibiting sx of drug/alchol withdrawal syndrome requiring multiple sedatives and leading to respiratory failure requiring intubation 9/27.CXR suspicious for right lower lobe pneumonia. seizures few days prior to this admissions due to alcohol withdrawal, they also report previous admission to [facility] for over 2 months due to refractory seizures with severe delirium tremens resulting in fall and cervical neck fracture status post neurosurgical intervention.  Pt failed SBT several attempts but finally extubated on 06/22/2019 and is currenlty on [place] O2 support.       SLP Plan  Continue with current plan of care       Recommendations  Diet recommendations: Dysphagia 1 (puree);Nectar-thick liquid Liquids provided via: Cup;Straw(monitor) Medication Administration: Crushed with puree(for safer swallowing) Supervision: Staff to assist with self feeding;Full supervision/cueing for compensatory strategies Compensations: Minimize environmental distractions;Slow rate;Small sips/bites;Lingual sweep for clearance of pocketing;Multiple dry swallows after each bite/sip;Follow solids with liquid Postural Changes and/or Swallow Maneuvers: Seated upright 90 degrees;Upright 30-60 min after meal                General recommendations: (Dietician f/u) Oral Care Recommendations: Oral care BID;Staff/trained caregiver to provide oral care Follow up Recommendations: Skilled Nursing facility(TBD) SLP Visit Diagnosis: Dysphagia, oropharyngeal phase (R13.12)(impacted by Cognitive decline) Plan: Continue with current plan of  care       GO  Thomas Som, MS, CCC-SLP Thomas [name] 06/26/2019, 5:01 PM

## 2019-06-27 LAB — BASIC METABOLIC PANEL
Anion gap: 11 (ref 5–15)
BUN: 16 mg/dL (ref 6–20)
CO2: 25 mmol/L (ref 22–32)
Calcium: 8.6 mg/dL — ABNORMAL LOW (ref 8.9–10.3)
Chloride: 102 mmol/L (ref 98–111)
Creatinine, Ser: 0.7 mg/dL (ref 0.61–1.24)
GFR calc Af Amer: >60 mL/min (ref >60)
GFR calc non Af Amer: >60 mL/min (ref >60)
Glucose, Bld: 88 mg/dL (ref 70–99)
Potassium: 3.6 mmol/L (ref 3.5–5.1)
Sodium: 138 mmol/L (ref 135–145)

## 2019-06-27 LAB — GLUCOSE, CAPILLARY
Glucose-Capillary: 79 mg/dL (ref 70–99)
Glucose-Capillary: 76 mg/dL (ref 70–99)
Glucose-Capillary: 70 mg/dL (ref 70–99)
Glucose-Capillary: 68 mg/dL — ABNORMAL LOW (ref 70–99)
Glucose-Capillary: 91 mg/dL (ref 70–99)

## 2019-06-27 LAB — HEPATIC FUNCTION PANEL
ALT: 34 U/L (ref 0–44)
AST: 40 U/L (ref 15–41)
Albumin: 2.4 g/dL — ABNORMAL LOW (ref 3.5–5.0)
Alkaline Phosphatase: 102 U/L (ref 38–126)
Bilirubin, Direct: 0.1 mg/dL (ref 0.0–0.2)
Indirect Bilirubin: 0.7 mg/dL (ref 0.3–0.9)
Total Bilirubin: 0.8 mg/dL (ref 0.3–1.2)
Total Protein: 6.9 g/dL (ref 6.5–8.1)

## 2019-06-27 LAB — VALPROIC ACID LEVEL: Valproic Acid Lvl: 49 ug/mL — ABNORMAL LOW (ref 50.0–100.0)

## 2019-06-27 LAB — PROCALCITONIN: Procalcitonin: 0.18 ng/mL

## 2019-06-27 MED ORDER — OLANZAPINE 5 MG PO TABS
2.5000 mg | ORAL_TABLET | Freq: Once | ORAL | Status: DC
  Filled 2019-06-27: qty 1

## 2019-06-27 MED ORDER — NICOTINE 14 MG/24HR TD PT24
14.0000 mg | MEDICATED_PATCH | Freq: Every day | TRANSDERMAL | Status: DC
  Administered 2019-06-27 – 2019-07-06 (×10): 14 mg via TRANSDERMAL
  Filled 2019-06-27 (×13): qty 1

## 2019-06-27 MED ORDER — DIPHENHYDRAMINE HCL 50 MG/ML IJ SOLN
25.0000 mg | Freq: Once | INTRAMUSCULAR | Status: AC
  Administered 2019-06-27: 25 mg via INTRAVENOUS
  Filled 2019-06-27: qty 1

## 2019-06-27 MED ORDER — ZIPRASIDONE MESYLATE 20 MG IM SOLR
10.0000 mg | Freq: Four times a day (QID) | INTRAMUSCULAR | Status: DC | PRN
  Administered 2019-06-27 – 2019-06-28 (×4): 10 mg via INTRAMUSCULAR
  Filled 2019-06-27 (×5): qty 20

## 2019-06-27 MED ORDER — LORAZEPAM 2 MG/ML IJ SOLN
2.0000 mg | Freq: Four times a day (QID) | INTRAMUSCULAR | Status: DC | PRN
  Administered 2019-06-27 – 2019-07-05 (×22): 2 mg via INTRAVENOUS
  Filled 2019-06-27 (×26): qty 1

## 2019-06-27 MED ORDER — FENTANYL CITRATE (PF) 100 MCG/2ML IJ SOLN
25.0000 ug | Freq: Once | INTRAMUSCULAR | Status: AC
  Administered 2019-06-27: 25 ug via INTRAVENOUS
  Filled 2019-06-27: qty 2

## 2019-06-27 NOTE — Progress Notes (Signed)
SLP Cancellation Note  Patient Details Name: Sherry Rogus MRN: [id number] DOB: 1965/10/19   Cancelled treatment:       Reason Eval/Treat Not Completed: Patient not medically ready;Fatigue/lethargy limiting ability to participate(chart reviewed; consulted NSG re: pt's status). Per nursing, pt was agitated/combative, confused so several medications were given to calm his behaviors this AM(he was trying to get out of bed and pull out medical equipment). Currently, he is too sleepy/lethargic to safely participate in po trials to upgrade pt's diet consistency, or to take po's at this time. Discussed w/ NSG the need to carefully assess pt's alertness and calmness for attention w/ po tasks at next meal(s) in order to determine if pt can safely participate w/ the meal. Pt must have full Supervision w/ any oral intake. Recommend alternative means for medications if unable to alert to safely take po's. Recommend frequent oral care for hygiene and stimulation of swallowing. NSG agreed.  ST services will f/u w/ pt's status tomorrow.    [name], [place], CCC-SLP Watson,Katherine 06/27/2019, 9:31 AM

## 2019-06-27 NOTE — TOC Progression Note (Signed)
Transition of Care [facility]) - Progression Note    Patient Details  Name: Euclid Cassetta MRN: [id number] Date of Birth: 1965-09-21  Transition of Care [facility] CM/SW Contact  Deray Dawes, Lenice Llamas Phone Number: [phone]  06/27/2019, 4:14 PM  Clinical Narrative: Clinical Social Worker (CSW) attempted to meet with patient however he was sleeping soundly. Per bedside sitter patient did not sleep last night and was agitated. CSW contacted patient's mother Elouise to discuss D/C plan. Per mother patient lives in [place] with his significant other [name]. Per mother patient is not married to [name] however they have been together for several years. Mother reported that patient does not have have a documented HPOA. Mother reported that patient has 1 adult daughter Sallee Lange however she is estranged. Mother reported that she does not know Alyssa's phone number but she will try to get it. Mother reported that [place] lives in [place], [place]. Per mother patient has been sober for 4 years up until 6-7 months ago and he started drinking again. Per mother she does not know what triggered his drinking. Mother reported that patient has medicaid and receives around $800 in SSI/ disability. Mother reported that patient is not at his baseline. Per mother patient was independent with his ADLs. CSW explained SNF placement option and that patient would have to sign over his SSI check. Mother reported that she is hoping patient's mental status clears up and he can go home but she will consider SNF. CSW explained the benefits of a palliative care consult. Mother is agreeable to palliative care consult MD aware of above. CSW provided emotional support. CSW will continue to follow and assist as needed.      Expected Discharge Plan: [place] Barriers to Discharge: Continued Medical Work up  Expected Discharge Plan and Services Expected Discharge Plan: [place]  arrangements for the past 2 months: Single Family Home                                       Social Determinants of Health (SDOH) Interventions    Readmission Risk Interventions No flowsheet data found.

## 2019-06-27 NOTE — NC FL2 (Signed)
[place] LEVEL OF CARE SCREENING TOOL     IDENTIFICATION  Patient Name: Thomas [name] Birthdate: Mar 11, 1966 Sex: male Admission Date (Current Location): 06/07/2019  [place] and [place] Number:  Selena Lesser [id number] [place] and Address:  [facility], [address] [place], [place] [address]      Provider Number: [id number]  Attending Physician Name and Address:  Hillary Bow, MD  Relative Name and Phone Number:       Current Level of Care: Hospital Recommended Level of Care: [place] Prior Approval Number:    Date Approved/Denied:   PASRR Number:    Discharge Plan: SNF    Current Diagnoses: Patient Active Problem List   Diagnosis Date Noted  . Acute respiratory failure ([place])   . Schizoaffective disorder, bipolar type ([place]) 06/09/2019  . Ischemia of extremity 06/07/2019    Orientation RESPIRATION BLADDER Height & Weight     Self  Normal Incontinent Weight: 151 lb 10.8 oz (68.8 kg) Height:  6' 0.01" (182.9 cm)  BEHAVIORAL SYMPTOMS/MOOD NEUROLOGICAL BOWEL NUTRITION STATUS      Incontinent Diet(Diet: DYS 1)  AMBULATORY STATUS COMMUNICATION OF NEEDS Skin   Extensive Assist Verbally Surgical wounds(Incision 9/25: Left Ankle.)                       Personal Care Assistance Level of Assistance  Bathing, Feeding, Dressing Bathing Assistance: Maximum assistance Feeding assistance: Maximum assistance Dressing Assistance: Maximum assistance     Functional Limitations Info  Sight, Hearing, Speech Sight Info: Adequate Hearing Info: Adequate Speech Info: Adequate    SPECIAL CARE FACTORS FREQUENCY  PT (By licensed PT), OT (By licensed OT)     PT Frequency: 5 OT Frequency: 5            Contractures      Additional Factors Info  Code Status, Allergies Code Status Info: Full Code. Allergies Info: Haldol Haloperidol Lactate           Current Medications (06/27/2019):  This is the  current hospital active medication list Current Facility-Administered Medications  Medication Dose Route Frequency Provider Last Rate Last Dose  . 0.9 %  sodium chloride infusion   Intravenous PRN Ottie Glazier, MD 10 mL/hr at 06/27/19 0600    . acetaminophen (TYLENOL) suppository 650 mg  650 mg Rectal Q6H PRN Flora Lipps, MD   650 mg at 06/24/19 1454  . Ampicillin-Sulbactam (UNASYN) 3 g in sodium chloride 0.9 % 100 mL IVPB  3 g Intravenous Q6H Aleskerov, Fuad, MD 200 mL/hr at 06/27/19 1147 3 g at 06/27/19 1147  . aspirin chewable tablet 81 mg  81 mg Oral Daily Pernell Dupre, RPH   81 mg at 06/26/19 0844  . baclofen (LIORESAL) tablet 10 mg  10 mg Per Tube TID Tyler Pita, MD   10 mg at 06/26/19 2119  . buprenorphine-naloxone (SUBOXONE) 8-2 mg per SL tablet 1 tablet  1 tablet Sublingual TID Tyler Pita, MD   1 tablet at 06/26/19 2119  . chlorhexidine gluconate (MEDLINE KIT) (PERIDEX) 0.12 % solution 15 mL  15 mL Mouth Rinse BID Ottie Glazier, MD   15 mL at 06/26/19 2121  . Chlorhexidine Gluconate Cloth 2 % PADS 6 each  6 each Topical Daily Hillary Bow, MD   6 each at 06/26/19 2318  . clobetasol cream (TEMOVATE) 0.05 %   Topical BID Flora Lipps, MD      . clopidogrel (PLAVIX) tablet 75 mg  75 mg Per Tube Daily Flora Lipps, MD   75 mg at 06/26/19 0844  . cyproheptadine (PERIACTIN) 4 MG tablet 4 mg  4 mg Per Tube TID Tyler Pita, MD   4 mg at 06/26/19 2323  . enoxaparin (LOVENOX) injection 40 mg  40 mg Subcutaneous Q24H Flora Lipps, MD   40 mg at 06/27/19 0927  . famotidine (PEPCID) tablet 20 mg  20 mg Per Tube BID Flora Lipps, MD   20 mg at 06/26/19 2119  . feeding supplement (NEPRO CARB STEADY) liquid 237 mL  237 mL Oral BID BM Sudini, Srikar, MD      . folic acid injection 1 mg  1 mg Intravenous Daily Flora Lipps, MD   1 mg at 06/27/19 1003  . influenza vac split quadrivalent PF (FLUARIX) injection 0.5 mL  0.5 mL Intramuscular Tomorrow-1000 Earnestine Leys, MD       . LORazepam (ATIVAN) injection 2 mg  2 mg Intravenous Q6H PRN Lang Snow, NP   2 mg at 06/27/19 1610  . MEDLINE mouth rinse  15 mL Mouth Rinse BID Tyler Pita, MD   15 mL at 06/26/19 2120  . metoprolol tartrate (LOPRESSOR) injection 2.5 mg  2.5 mg Intravenous Q6H PRN Tyler Pita, MD   2.5 mg at 06/24/19 1240  . multivitamin liquid 15 mL  15 mL Per Tube Daily Flora Lipps, MD   15 mL at 06/26/19 0843  . nicotine (NICODERM CQ - dosed in mg/24 hours) patch 14 mg  14 mg Transdermal Daily Lang Snow, NP   14 mg at 06/27/19 0926  . OLANZapine (ZYPREXA) tablet 2.5 mg  2.5 mg Oral Once Mansy, Arvella Merles, MD   Stopped at 06/27/19 0038  . senna (SENOKOT) tablet 8.6 mg  1 tablet Oral Daily Dallie Piles, RPH   8.6 mg at 06/26/19 0981  . sodium chloride flush (NS) 0.9 % injection 10-40 mL  10-40 mL Intracatheter Q12H Earnestine Leys, MD   10 mL at 06/27/19 0933  . sodium chloride flush (NS) 0.9 % injection 10-40 mL  10-40 mL Intracatheter PRN Earnestine Leys, MD   10 mL at 06/17/19 1145  . sodium chloride flush (NS) 0.9 % injection 3 mL  3 mL Intravenous PRN Earnestine Leys, MD   3 mL at 06/27/19 0933  . thiamine (B-1) injection 100 mg  100 mg Intravenous Daily Flora Lipps, MD   100 mg at 06/27/19 0927  . valproate (DEPACON) 250 mg in dextrose 5 % 50 mL IVPB  250 mg Intravenous TID Ottie Glazier, MD 52.5 mL/hr at 06/27/19 1622 250 mg at 06/27/19 1622  . ziprasidone (GEODON) injection 10 mg  10 mg Intramuscular Q6H PRN Mansy, Jan A, MD   10 mg at 06/27/19 1442     Discharge Medications: Please see discharge summary for a list of discharge medications.  Relevant Imaging Results:  Relevant Lab Results:   Additional Information SSN: [id number]  Faraz Ponciano, Veronia Beets, LCSW

## 2019-06-27 NOTE — Progress Notes (Signed)
PT Cancellation Note  Patient Details Name: Thomas [name] MRN: [id number] DOB: [birth year]   Cancelled Treatment:    Reason Eval/Treat Not Completed: Fatigue/lethargy limiting ability to participate.  Per nursing, pt agitated/combative and given sedatives and is now too lethargic to participate with PT services.  Will attempt to see pt at a future date/time as medically appropriate.     Linus Salmons PT, DPT 06/27/19, 9:18 AM

## 2019-06-27 NOTE — Progress Notes (Signed)
The patient was seen and examined.  He has been agitated all night.  He received Seroquel and as needed Zyprexa Zydis last night.  Continued to be agitated.  He received IV Benadryl and fentanyl.  He slept 1 to 2 hours and again tried to get out of bed several times.  He received 2 mg of IV Ativan.  Is mostly redirectable follows commands.  This morning he became significantly agitated.  I have ordered 10 mg of IM Geodon every 6 hours as needed.  He will have a psychiatry consultation.we will continue to have a sitter as he had during his stay in the ICU.  On physical examination; Generally: He was restless but noncombative and globally confused with tangential speech.  He would occasionally responds appropriately to questions. HEENT, atraumatic normocephalic.  PERRLA, EOMI.  Neck supple no JVD. OP, mucous membranes and tongue.  Cardio-vascular regular rate rhythm with normal S1-S2 and no murmurs gallops or rubs.  Lungs were clear to auscultation bilaterally.  Abdomen soft nontender plus bowel sounds.  Extremities with no edema clubbing or cyanosis.  Right leg in cast.  Neuro is moving all 4 extremities with no lateralizing signs.  Psychiatry will follow the patient.  They recommended 10 mg of p.o. Zyprexa nightly.  We will continue to closely monitor him.  The case was discussed with Dr. Darvin Neighbours.

## 2019-06-27 NOTE — Progress Notes (Signed)
Nutrition Follow-up  DOCUMENTATION CODES:   Not applicable  INTERVENTION:  Continue Nepro Shake po BID, each supplement provides 425 kcal and 19 grams protein.  Consider increasing bowel regimen.  NUTRITION DIAGNOSIS:   Inadequate oral intake related to inability to eat as evidenced by NPO status.  Resolving - diet now advanced.  GOAL:   Patient will meet greater than or equal to 90% of their needs  Progressing.  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 Pt now s/p vascular repair 9/24 and ORIF 9/25. Pt devloped withdrawl symptoms and agitation on 9/27 requiring intubation   -Patient was extubated on 10/7.  Patient's diet was advanced to dysphagia 1 with nectar-thick liquids on 10/12. Per chart he has been eating 10-50% of meals. He was started on Nepro yesterday for oral nutrition supplement. Today patient has been drowsy and unable to take PO. He was agitated overnight and was given Geodon. Patient has a [employer] cabin crew.  Medications reviewed and include: famotidine, folic acid 1 mg daily IV, nicotine patch, senna 1 tablet daily, thiamine 100 mg daily IV,  Unasyn, valproate.  Labs reviewed: CBG 70-81.  Diet Order:   Diet Order            DIET - DYS 1 Room service appropriate? Yes with Assist; Fluid consistency: Nectar Thick  Diet effective now             EDUCATION NEEDS:   No education needs have been identified at this time  Skin:  Skin Assessment: Skin Integrity Issues:(closed incision right ankle; ecchymosis)  Last BM:  06/21/2019 per chart  Height:   Ht Readings from  Last 1 Encounters:  06/15/19 6' 0.01" (1.829 m)   Weight:   Wt Readings from Last 1 Encounters:  06/26/19 68.8 kg   Ideal Body Weight:  80.9 kg  BMI:  Body mass index is 20.57 kg/m.  Estimated Nutritional Needs:   Kcal:  2000-2200  Protein:  100-110 grams  Fluid:  2-2.2 L/day  Willey Blade, MS, RD, LDN Office: [phone] Pager: [phone] After Hours/Weekend Pager: [phone]

## 2019-06-27 NOTE — Progress Notes (Signed)
Patient delirious and restless overnight requiring re-direction and a sitter. Unfortunately there was not enough staffing for sitter.  He was trying to get out of bed and pull out medical equipment. Seroquel administered with no improved. He also received IV fentanyl 25 mcg and Benadryl with some slight improvement.  He continued to require redirection after a few hours and IV lorazepam was administered.  No complaints per nursing staff until this morning at around 645am security alert called due to increased agitation.    Rufina Falco, DNP,CCRN, FNP-BC [facility] Nurse Practitioner Between 7am to 6pm - Pager - [phone]  After 6pm go to [email or web address] - Proofreader  [employer]  [phone]

## 2019-06-27 NOTE — Consult Note (Signed)
  Patient seen on the floors. Continues to be confused, with AMS. Although patient does seem to be improving somewhat as he will respond to his name and begin to talk. Likely confabulating.   Did not meaningfully participate in conversation.  At this point, patient is not yet at his baseline.  Likely still delirious.  Continue to search for ulterior reasons  of altered mental status. Consider Head CT  Continue with IV Valproate TID- can be changed to 500 mg PO TID once able to tolerate PO medications  -valproate acid level 49 -LFTs- WNL  -Continue with IV Lorazepam PRN anxiety/agitation  -Hold Suboxone if giving opiate medications  -Patient should be started on antipsychotic medications when no longer delirious  Can start at Olanzapine 5mg  PO qhs If unable to take PO medications, can use IV thorazine 50mg  QHS  If patient requires medications for agitation, please use lower doses as patient still with AMS   Psychiatry will continue to follow

## 2019-06-27 NOTE — Progress Notes (Signed)
[facility] - [place] at [facility]   PATIENT NAME: Thomas [name]    MR#:  [id number]  DATE OF BIRTH:  [birth year]  SUBJECTIVE:  CHIEF COMPLAINT:   Chief Complaint  Patient presents with  . Fall   Very agitated overnight.  Received Geodon and presently drowsy.  Sitter at bedside.  Afebrile.  Not on oxygen.  REVIEW OF SYSTEMS:  Review of Systems  Unable to perform ROS: Critical illness   DRUG ALLERGIES:   Allergies  Allergen Reactions  . Haldol [Haloperidol Lactate]    VITALS:  Blood pressure 113/85, pulse (!) 102, temperature 98.3 F (36.8 C), resp. rate 20, height 6' 0.01" (1.829 m), weight 68.8 kg, SpO2 98 %.  PHYSICAL EXAMINATION:  Physical Exam  GENERAL:  53 y.o.-year-old patient lying in the bed. EYES: Pupils equal, round, reactive to light and accommodation. No scleral icterus. Extraocular muscles intact.  HEENT: Head atraumatic, normocephalic. Oropharynx and nasopharynx clear.  NECK:  Supple, no jugular venous distention. No thyroid enlargement, no tenderness.  LUNGS: Normal breath sounds bilaterally, no wheezing, rales,rhonchi or crepitation. No use of accessory muscles of respiration.  Decreased bibasilar breath sounds CARDIOVASCULAR: S1, S2 normal. No murmurs, rubs, or gallops.  ABDOMEN: Soft, nontender, nondistended. Bowel sounds present. No organomegaly or mass.  EXTREMITIES: No  cyanosis, or clubbing.  Right leg and ankle in a cast.   Good capillary refill is noted. NEUROLOGIC: sedated   PSYCHIATRIC: Drowsy today.  Unable to assess.  Agitated overnight SKIN: No rash or ulcers  LABORATORY PANEL:   CBC Recent Labs  Lab 06/26/19 0435  WBC 7.1  HGB 9.4*  HCT 29.0*  PLT 344   ------------------------------------------------------------------------------------------------------------------  Chemistries  Recent Labs  Lab 06/25/19 0431  06/27/19 0507  NA 136   < > 138  K 3.5   < > 3.6  CL 101   < > 102  CO2 25   < > 25  GLUCOSE  351*   < > 88  BUN 8   < > 16  CREATININE 0.61   < > 0.70  CALCIUM 8.3*   < > 8.6*  MG 2.0  --   --   AST  --   --  40  ALT  --   --  34  ALKPHOS  --   --  102  BILITOT  --   --  0.8   < > = values in this interval not displayed.   ------------------------------------------------------------------------------------------------------------------  Cardiac Enzymes No results for input(s): TROPONINI in the last 168 hours. ------------------------------------------------------------------------------------------------------------------  RADIOLOGY:  [place] Venous Img Lower Bilateral  Result Date: 06/25/2019 CLINICAL DATA:  53 year old male with bilateral lower extremity pain and swelling. EXAM: BILATERAL LOWER EXTREMITY VENOUS DOPPLER ULTRASOUND TECHNIQUE: Gray-scale sonography with graded compression, as well as color Doppler and duplex ultrasound were performed to evaluate the lower extremity deep venous systems from the level of the common femoral vein and including the common femoral, femoral, profunda femoral, popliteal and calf veins including the posterior tibial, peroneal and gastrocnemius veins when visible. The superficial great saphenous vein was also interrogated. Spectral Doppler was utilized to evaluate flow at rest and with distal augmentation maneuvers in the common femoral, femoral and popliteal veins. COMPARISON:  None. FINDINGS: RIGHT LOWER EXTREMITY Common Femoral Vein: No evidence of thrombus. Normal compressibility, respiratory phasicity and response to augmentation. Saphenofemoral Junction: No evidence of thrombus. Normal compressibility and flow on color Doppler imaging. Profunda Femoral Vein: No evidence of thrombus. Normal compressibility and  flow on color Doppler imaging. Femoral Vein: No evidence of thrombus. Normal compressibility, respiratory phasicity and response to augmentation. Popliteal Vein: No evidence of thrombus. Normal compressibility, respiratory phasicity and  response to augmentation. Calf Veins: No evidence of thrombus. Normal compressibility and flow on color Doppler imaging. Superficial Great Saphenous Vein: No evidence of thrombus. Normal compressibility. Venous Reflux:  None. Other Findings:  None. LEFT LOWER EXTREMITY Common Femoral Vein: No evidence of thrombus. Normal compressibility, respiratory phasicity and response to augmentation. Saphenofemoral Junction: No evidence of thrombus. Normal compressibility and flow on color Doppler imaging. Profunda Femoral Vein: No evidence of thrombus. Normal compressibility and flow on color Doppler imaging. Femoral Vein: No evidence of thrombus. Normal compressibility, respiratory phasicity and response to augmentation. Popliteal Vein: No evidence of thrombus. Normal compressibility, respiratory phasicity and response to augmentation. Calf Veins: No evidence of thrombus. Normal compressibility and flow on color Doppler imaging. Superficial Great Saphenous Vein: No evidence of thrombus. Normal compressibility. Venous Reflux:  None. Other Findings:  None. IMPRESSION: No evidence of deep venous thrombosis in either lower extremity. Electronically Signed   By: Jacqulynn Cadet M.D.   On: 06/25/2019 14:41   Dg Chest Port 1 View  Result Date: 06/25/2019 CLINICAL DATA:  Pulmonary disease, current smoker. EXAM: PORTABLE CHEST 1 VIEW COMPARISON:  Chest radiograph dated 06/20/2019 FINDINGS: A right upper extremity peripherally inserted central venous catheter tip overlies the superior cavoatrial junction. An endotracheal tube and an enteric tube have been removed. Fixation hardware is seen in the cervical spine. The heart size and mediastinal contours are within normal limits. Patchy bilateral interstitial and airspace opacities have decreased on the right and are unchanged on the left. There is no pleural effusion or pneumothorax. IMPRESSION: Bilateral interstitial and airspace opacities have decreased on the right.  Electronically Signed   By: Zerita Boers M.D.   On: 06/25/2019 11:37    EKG:   Orders placed or performed during the hospital encounter of 06/07/19  . EKG 12-Lead  . EKG 12-Lead    ASSESSMENT AND PLAN:   JamesRhodesis a54 y.o.malewith a known history of polysubstance abuse including alcohol,schizoaffective bipolar type,seizures,hypertension and insomnia who initially presented on 06/07/2019 following an episode of a fall. Was evaluated in the emergency room and found to have right ankle fracture as well as right foot ischemia.Went into alcohol withdrawal  *Acute inpatient delirium.  Geodon seems to have helped.  Will wait for psychiatry for long-term plan. Ativan PRN  *Acute hypoxic respiratory failure secondary to pneumonia.  Possible aspiration.  Intubated for hypoxic respiratory failure and airway protection on 06/10/2019,  extubated on 06/20/2019. On nasal cannula. IV Unasyn.  Finished IV Bactrim Respiratory cultures with Klebsiella/stenotrophomonas  *Right lower extremity ischemia -status post revascularization with angioplasty and stent placement by vascular surgery in right superficial femoral artery, right external iliac artery. -Continue aspirin and Plavix  *Right ankle fracture. Status post fall Patient status post open reduction with internal fixation of right ankle by orthopedic physician. -Patient will need physical therapy -On Suboxone as well  *History of schizoaffective bipolar type -Being followed by psychiatry service and managing psych meds.   -Also on IV Depacon for mood stabilization. Can be changed to oral medications once able to tolerate p.o.  *Alcohol abuse with delirium tremens and now acute metabolic encephalopathy and inpatient delirium -Off Precedex drip  Continue thiamine,multivitamin and folic acid.  *. DVT prophylaxis;Lovenox  Physical therapy consult and speech consult   All the records are reviewed and case  discussed with  Care Management/Social Worker Management plans discussed with the patient, family and they are in agreement.  CODE STATUS: Full code  TOTAL TIME TAKING CARE OF THIS PATIENT: 35 minutes.   POSSIBLE D/C IN ? DAYS, DEPENDING ON CLINICAL CONDITION.  Molinda Bailiff Tukker Byrns M.D on 06/27/2019 at 10:33 AM  Between 7am to 6pm - Pager - [phone]  After 6pm go to [email or web address] - password [email or web address]  [facility]  Office  [phone]  CC: Primary care physician; Patient, [name]

## 2019-06-27 NOTE — Consult Note (Addendum)
PHARMACY CONSULT NOTE - FOLLOW UP  Pharmacy Consult for Electrolyte Monitoring and Replacement   53 y/o M admitted with right lower extremity ischemia and right ankle fracture now s/p peripheral revascularization and ORIF. Patient has a history significant for schizophrenia and substance abuse. Post-operatively he became extremely agitated likely secondary to drug/alcohol withdrawal requiring high doses of sedatives. He is currently intubated and sedated in the ICU.   Update  -10/7: Patient extubated to [facility] -10/14: Transferred back to orthopedic floor from ICU  Recent Labs: Potassium (mmol/L)  Date Value  06/27/2019 3.6   Magnesium (mg/dL)  Date Value  06/25/2019 2.0   Calcium (mg/dL)  Date Value  06/27/2019 8.6 (L)   Albumin (g/dL)  Date Value  06/27/2019 2.4 (L)   Phosphorus (mg/dL)  Date Value  06/26/2019 3.3   Sodium (mmol/L)  Date Value  06/27/2019 138   Corrected calcium: 9.88 mg/dL  Electrolytes: Patient has a history of alcohol abuse. Baseline nutritional status un-clear. Goal of therapy to replete electrolytes to normal levels. Patient with good UOP.   -No electrolyte supplementation indicated today   -D5LR d/c   -BMP tomorrow AM  Constipation:  LBM 10/8. Constipating medications include hydromorphone, cyproheptadine. Rectal tube removed. C diff panel 10/6 negative. Patient has not had good PO intake. Senna one tab daily was started yesterday. Patient is not taking PO meds today.   Glucose: No baseline A1c in chart and no history of DM recorded. Patient is currently eating a nectar-thick diet. Medications that could contribute to development of new-onset diabetes include quetiapine. There are 2 serum glucose levels that were elevated but drawn while D5LR was infusing. Finger sticks are within acceptable limits.     -Continue to monitor blood glucoses.  No DM agents.   Pharmacy will continue to monitor and adjust per consult.   Rowland Lathe,  PharmD 06/27/2019 7:19 AM

## 2019-06-28 ENCOUNTER — Inpatient Hospital Stay: Payer: Medicaid Other

## 2019-06-28 DIAGNOSIS — Z515 Encounter for palliative care: Secondary | ICD-10-CM

## 2019-06-28 LAB — BASIC METABOLIC PANEL
Anion gap: 12 (ref 5–15)
BUN: 13 mg/dL (ref 6–20)
CO2: 28 mmol/L (ref 22–32)
Calcium: 9 mg/dL (ref 8.9–10.3)
Chloride: 100 mmol/L (ref 98–111)
Creatinine, Ser: 0.66 mg/dL (ref 0.61–1.24)
GFR calc Af Amer: >60 mL/min (ref >60)
GFR calc non Af Amer: >60 mL/min (ref >60)
Glucose, Bld: 91 mg/dL (ref 70–99)
Potassium: 4.6 mmol/L (ref 3.5–5.1)
Sodium: 140 mmol/L (ref 135–145)

## 2019-06-28 LAB — GLUCOSE, CAPILLARY
Glucose-Capillary: 75 mg/dL (ref 70–99)
Glucose-Capillary: 78 mg/dL (ref 70–99)

## 2019-06-28 LAB — TSH: TSH: 2.991 u[IU]/mL (ref 0.350–4.500)

## 2019-06-28 LAB — MAGNESIUM: Magnesium: 1.8 mg/dL (ref 1.7–2.4)

## 2019-06-28 LAB — FOLATE: Folate: 27 ng/mL (ref >5.9)

## 2019-06-28 LAB — VITAMIN B12: Vitamin B-12: 798 pg/mL (ref 180–914)

## 2019-06-28 MED ORDER — POLYETHYLENE GLYCOL 3350 17 G PO PACK
17.0000 g | PACK | Freq: Two times a day (BID) | ORAL | Status: AC
  Filled 2019-06-28 (×2): qty 1

## 2019-06-28 NOTE — Progress Notes (Signed)
Pt refuses all PO medications and is uncooperative with staff, MD aware. Impulsive, tries to get out of bed and and mess with PICC. Sitter at bedside for safety. Ativan and Geodon administered this shift for agitation. Will continue to monitor.

## 2019-06-28 NOTE — TOC Progression Note (Addendum)
Transition of Care [facility]) - Progression Note    Patient Details  Name: Thomas [name] MRN: [id number] Date of Birth: 25-Apr-1966  [facility] CM/SW Contact  Cataldo Cosgriff, Lenice Llamas Phone Number: [phone]  06/28/2019, 9:04 AM  Clinical Narrative: FL2 complete and PASARR started in case SNF is needed. Patient's SSN in [place] Must is different then SSN in Epic. SSN in [place] Must is [id number].   9:50 am: Clinical Social Worker (CSW) met with patient this morning. Patient was awake however he appeared very confused and could not hold a conversation. Per RN patient will have a head CT today. CSW will continue to follow and assist as needed.      Expected Discharge Plan: [place] Barriers to Discharge: Continued Medical Work up  Expected Discharge Plan and Services Expected Discharge Plan: [place] arrangements for the past 2 months: Single Family Home                                       Social Determinants of Health (SDOH) Interventions    Readmission Risk Interventions No flowsheet data found.

## 2019-06-28 NOTE — Consult Note (Signed)
Reason for Consult:delirium  Referring Physician: Dr. Darvin Neighbours  CC: confusion   HPI: Thomas [name] is an 53 y.o. male malewith a known history of polysubstance abuse including alcohol,schizoaffective bipolar type,seizures,hypertension and insomnia who initially presented on 06/07/2019 following an episode of a fall. Pt has been in ICU for prolonged period of time and currently has hallucinations.    Past Medical History:  Diagnosis Date  . Alcohol abuse   . Anemia   . Bipolar 1 disorder ([place])   . Cervical spine fracture ([place])   . Chronic, continuous use of opioids   . History of seizure   . HTN (hypertension)   . Insomnia   . MDD (major depressive disorder), recurrent, severe, with psychosis ([place])   . Pancreatitis     Past Surgical History:  Procedure Laterality Date  . LOWER EXTREMITY ANGIOGRAPHY Right 06/07/2019   Procedure: Lower Extremity Angiography;  Surgeon: Katha Cabal, MD;  Location: [facility];  Service: Cardiovascular;  Laterality: Right;  . ORIF ANKLE FRACTURE Right 06/08/2019   Procedure: OPEN REDUCTION INTERNAL FIXATION (ORIF) ANKLE FRACTURE;  Surgeon: Earnestine Leys, MD;  Location: [facility];  Service: Orthopedics;  Laterality: Right;    History reviewed. No pertinent family history.  Social History:  reports that he has been smoking. He has never used smokeless tobacco. He reports current alcohol use. No history on file for drug.  Allergies  Allergen Reactions  . Haldol [Haloperidol Lactate]     Medications: I have reviewed the patient's current medications.  ROS: Unable to obtain due to confusion   Physical Examination: Blood pressure 119/76, pulse (!) 110, temperature 98.2 F (36.8 C), temperature source Axillary, resp. rate 20, height 6' 0.01" (1.829 m), weight 68.8 kg, SpO2 96 %.    Neurological Examination   Mental Status: Alert but halucinating Cranial Nerves: II: Discs flat bilaterally; Visual fields grossly normal,  pupils equal, round, reactive to light and accommodation III,IV, VI: ptosis not present, extra-ocular motions intact bilaterally V,VII: smile symmetric, facial light touch sensation normal bilaterally VIII: hearing normal bilaterally IX,X: gag reflex present XI: bilateral shoulder shrug XII: midline tongue extension Motor: Moves all extremities symmetrically  Tone and bulk:normal tone throughout; no atrophy noted Sensory: Pinprick and light touch intact throughout, bilaterally Deep Tendon Reflexes: 1+ and symmetric throughout Plantars: Right: downgoing   Left: downgoing Cerebellar: Not tested       Laboratory Studies:   Basic Metabolic Panel: Recent Labs  Lab 06/22/19 0315 06/23/19 0325 06/24/19 0542 06/25/19 0431 06/26/19 0435 06/27/19 0507 06/28/19 0509  NA 135 135 138 136 137 138 140  K 3.9 3.5 3.6 3.5 3.7 3.6 4.6  CL 105 104 105 101 100 102 100  CO2 23 22 25 25 26 25 28   GLUCOSE 117* 101* 96 351* 291* 88 91  BUN 11 12 7 8 12 16 13   CREATININE 0.60* 0.64 0.61 0.61 0.60* 0.70 0.66  CALCIUM 8.0* 7.8* 8.3* 8.3* 8.2* 8.6* 9.0  MG 2.0 1.9 1.9 2.0  --   --  1.8  PHOS 4.7* 5.1* 4.8* 5.1* 3.3  --   --     Liver Function Tests: Recent Labs  Lab 06/27/19 0507  AST 40  ALT 34  ALKPHOS 102  BILITOT 0.8  PROT 6.9  ALBUMIN 2.4*   No results for input(s): LIPASE, AMYLASE in the last 168 hours. No results for input(s): AMMONIA in the last 168 hours.  CBC: Recent Labs  Lab 06/22/19 0315 06/23/19 0325 06/26/19 0435  WBC 12.0* 10.1 7.1  NEUTROABS 9.6* 7.3 4.1  HGB 9.5* 9.1* 9.4*  HCT 28.3* 27.2* 29.0*  MCV 99.0 99.6 102.1*  PLT 306 305 344    Cardiac Enzymes: Recent Labs  Lab 06/25/19 1043  CKTOTAL 28*    BNP: Invalid input(s): POCBNP  CBG: Recent Labs  Lab 06/27/19 1229 06/27/19 1714 06/27/19 2042 06/28/19 0807 06/28/19 1206  GLUCAP 70 68* 91 75 78    Microbiology: Results for orders placed or performed during the hospital encounter of  06/07/19  SARS Coronavirus 2 [facility] order, Performed in [facility] hospital lab) Nasopharyngeal Nasopharyngeal Swab     Status: None   Collection Time: 06/07/19  6:34 PM   Specimen: Nasopharyngeal Swab  Result Value Ref Range Status   SARS Coronavirus 2 NEGATIVE NEGATIVE Final    Comment: (NOTE) If result is NEGATIVE SARS-CoV-2 target nucleic acids are NOT DETECTED. The SARS-CoV-2 RNA is generally detectable in upper and lower  respiratory specimens during the acute phase of infection. The lowest  concentration of SARS-CoV-2 viral copies this assay can detect is 250  copies / mL. A negative result does not preclude SARS-CoV-2 infection  and should not be used as the sole basis for treatment or other  patient management decisions.  A negative result may occur with  improper specimen collection / handling, submission of specimen other  than nasopharyngeal swab, presence of viral mutation(s) within the  areas targeted by this assay, and inadequate number of viral copies  (<250 copies / mL). A negative result must be combined with clinical  observations, patient history, and epidemiological information. If result is POSITIVE SARS-CoV-2 target nucleic acids are DETECTED. The SARS-CoV-2 RNA is generally detectable in upper and lower  respiratory specimens dur ing the acute phase of infection.  Positive  results are indicative of active infection with SARS-CoV-2.  Clinical  correlation with patient history and other diagnostic information is  necessary to determine patient infection status.  Positive results do  not rule out bacterial infection or co-infection with other viruses. If result is PRESUMPTIVE POSTIVE SARS-CoV-2 nucleic acids MAY BE PRESENT.   A presumptive positive result was obtained on the submitted specimen  and confirmed on repeat testing.  While 2019 novel coronavirus  (SARS-CoV-2) nucleic acids may be present in the submitted sample  additional confirmatory testing  may be necessary for epidemiological  and / or clinical management purposes  to differentiate between  SARS-CoV-2 and other Sarbecovirus currently known to infect humans.  If clinically indicated additional testing with an alternate test  methodology [phone]) is advised. The SARS-CoV-2 RNA is generally  detectable in upper and lower respiratory sp ecimens during the acute  phase of infection. The expected result is Negative. Fact Sheet for Patients:  BoilerBrush.com.cy Fact Sheet for Healthcare Providers: https://pope.com/ This test is not yet approved or cleared by the [place] FDA and has been authorized for detection and/or diagnosis of SARS-CoV-2 by FDA under an Emergency Use Authorization (EUA).  This EUA will remain in effect (meaning this test can be used) for the duration of the COVID-19 declaration under Section 564(b)(1) of the Act, 21 U.S.C. section 360bbb-3(b)(1), unless the authorization is terminated or revoked sooner. Performed at [facility], [address] [place], [place] [address]   MRSA PCR Screening     Status: None   Collection Time: 06/08/19  5:53 AM   Specimen: Nasal Mucosa; Nasopharyngeal  Result Value Ref Range Status   MRSA by PCR NEGATIVE NEGATIVE Final  Comment:        The GeneXpert MRSA Assay (FDA approved for NASAL specimens only), is one component of a comprehensive MRSA colonization surveillance program. It is not intended to diagnose MRSA infection nor to guide or monitor treatment for MRSA infections. Performed at [facility], [address] [place], [place] [address]   Culture, respiratory     Status: None   Collection Time: 06/13/19  3:28 PM   Specimen: Tracheal Aspirate  Result Value Ref Range Status   Specimen Description   Final    TRACHEAL ASPIRATE Performed at [facility], [address], [place], [place] [address]    Special Requests    Final    NONE Performed at [facility], [address] [place], [place] [address]    Gram Stain   Final    ABUNDANT WBC PRESENT, PREDOMINANTLY PMN ABUNDANT GRAM POSITIVE COCCI RARE GRAM NEGATIVE RODS Performed at [facility], [address], [place], [place] [address]    Culture   Final    ABUNDANT KLEBSIELLA OXYTOCA ABUNDANT STENOTROPHOMONAS MALTOPHILIA    Report Status 06/17/2019 FINAL  Final   Organism ID, Bacteria KLEBSIELLA OXYTOCA  Final   Organism ID, Bacteria STENOTROPHOMONAS MALTOPHILIA  Final      Susceptibility   Klebsiella oxytoca - MIC*    AMPICILLIN >=32 RESISTANT Resistant     CEFAZOLIN >=64 RESISTANT Resistant     CEFEPIME <=1 SENSITIVE Sensitive     CEFTAZIDIME <=1 SENSITIVE Sensitive     CEFTRIAXONE <=1 SENSITIVE Sensitive     CIPROFLOXACIN <=0.25 SENSITIVE Sensitive     GENTAMICIN <=1 SENSITIVE Sensitive     IMIPENEM <=0.25 SENSITIVE Sensitive     TRIMETH/SULFA <=20 SENSITIVE Sensitive     AMPICILLIN/SULBACTAM 4 SENSITIVE Sensitive     PIP/TAZO <=4 SENSITIVE Sensitive     Extended ESBL NEGATIVE Sensitive     * ABUNDANT KLEBSIELLA OXYTOCA   Stenotrophomonas maltophilia - MIC*    LEVOFLOXACIN 0.25 SENSITIVE Sensitive     TRIMETH/SULFA <=20 SENSITIVE Sensitive     * ABUNDANT STENOTROPHOMONAS MALTOPHILIA  C difficile quick scan w PCR reflex     Status: None   Collection Time: 06/19/19  8:52 AM   Specimen: STOOL  Result Value Ref Range Status   C Diff antigen NEGATIVE NEGATIVE Final   C Diff toxin NEGATIVE NEGATIVE Final   C Diff interpretation No C. difficile detected.  Final    Comment: Performed at [facility], [address], [place], [place] [address]  Urine Culture     Status: None   Collection Time: 06/24/19  2:58 PM   Specimen: Urine, Random  Result Value Ref Range Status   Specimen Description   Final    URINE, RANDOM Performed at [facility], [address], [place], [place] [address]    Special  Requests   Final    NONE Performed at [facility], [address], [place], [place] [address]    Culture   Final    NO GROWTH Performed at [facility], [address], [place], [place] [address]    Report Status 06/25/2019 FINAL  Final  CULTURE, BLOOD (ROUTINE X 2) w Reflex to ID Panel     Status: None (Preliminary result)   Collection Time: 06/24/19  3:26 PM   Specimen: BLOOD  Result Value Ref Range Status   Specimen Description BLOOD BLOOD LEFT WRIST  Final   Special Requests   Final  BOTTLES DRAWN AEROBIC AND ANAEROBIC Blood Culture adequate volume   Culture   Final    NO GROWTH 4 DAYS Performed at [facility], [address] [place], [place] [address]    Report Status PENDING  Incomplete  CULTURE, BLOOD (ROUTINE X 2) w Reflex to ID Panel     Status: None (Preliminary result)   Collection Time: 06/24/19  3:32 PM   Specimen: BLOOD  Result Value Ref Range Status   Specimen Description BLOOD LEFT ANTECUBITAL  Final   Special Requests   Final    BOTTLES DRAWN AEROBIC AND ANAEROBIC Blood Culture results may not be optimal due to an excessive volume of blood received in culture bottles   Culture   Final    NO GROWTH 4 DAYS Performed at [facility], [address] [place], [place] [address]    Report Status PENDING  Incomplete    Coagulation Studies: No results for input(s): LABPROT, INR in the last 72 hours.  Urinalysis: No results for input(s): COLORURINE, LABSPEC, PHURINE, GLUCOSEU, HGBUR, BILIRUBINUR, KETONESUR, PROTEINUR, UROBILINOGEN, NITRITE, LEUKOCYTESUR in the last 168 hours.  Invalid input(s): APPERANCEUR  Lipid Panel:     Component Value Date/Time   TRIG 59 06/13/2019 1924    HgbA1C: No results found for: HGBA1C  Urine Drug Screen:      Component Value Date/Time   LABOPIA NONE DETECTED 06/10/2019 1000   COCAINSCRNUR NONE DETECTED 06/10/2019 1000   LABBENZ POSITIVE (A) 06/10/2019 1000   AMPHETMU NONE DETECTED  06/10/2019 1000   THCU POSITIVE (A) 06/10/2019 1000   LABBARB NONE DETECTED 06/10/2019 1000    Alcohol Level: No results for input(s): ETH in the last 168 hours.  Other results: EKG: normal EKG, normal sinus rhythm, unchanged from previous tracings.  Imaging: Ct Head Wo Contrast  Result Date: 06/28/2019 CLINICAL DATA:  AMS. MD NOTE: 53 y.o. male with a known history of polysubstance abuse including alcohol, schizoaffective bipolar type, seizures, hypertension and insomnia who initially presented on 06/07/2019 following an episode of a fall. Patient had a RIGHT ankle fracture and RIGHT foot is seen. The patient went into alcohol withdrawal. EXAM: CT HEAD WITHOUT CONTRAST TECHNIQUE: Contiguous axial images were obtained from the base of the skull through the vertex without intravenous contrast. COMPARISON:  None. FINDINGS: Brain: No evidence of acute infarction, hemorrhage, hydrocephalus, extra-axial collection or mass lesion/mass effect. There is mild central cortical atrophy. Vascular: There is atherosclerotic calcification of the internal carotid arteries. No hyperdense vessels. Skull: Normal. Negative for fracture or focal lesion. Sinuses/Orbits: No acute finding. Other: There multiple age-indeterminate fractures of the nasal bones bilaterally. No significant soft tissue swelling. IMPRESSION: 1. No evidence for acute intracranial abnormality. 2. Bilateral nasal bone fractures, favored to be chronic. Electronically Signed   By: Norva PavlovElizabeth  Brown M.D.   On: 06/28/2019 12:26     Assessment/Plan: 53 y.o. male malewith a known history of polysubstance abuse including alcohol,schizoaffective bipolar type,seizures,hypertension and insomnia who initially presented on 06/07/2019 following an episode of a fall. Pt has been in ICU for prolonged period of time and currently has hallucinations.   - Hyperactive delirium with hallucinations - No clear focality on exam - atypical anti psychotics -  difficult prognosticate neurological stand point given the hallucinations and inability to have full conversation.   06/28/2019, 3:52 PM

## 2019-06-28 NOTE — Progress Notes (Signed)
PT Cancellation Note  Patient Details Name: Thomas [name] MRN: [id number] DOB: 07-Oct-1965   Cancelled Treatment:    Reason Eval/Treat Not Completed: Medical issues which prohibited therapy.  Spoke to nursing who stated pt continues to present with significant confusion/disorientation and would likely not be able to follow commands with PT services making NWB status compliance unlikely.  Will hold PT services this date but will attempt to see pt at a future date/time as appropriate.     Linus Salmons PT, DPT 06/28/19, 2:06 PM

## 2019-06-28 NOTE — Consult Note (Signed)
  Patient seen on the floors. Continues to be confused, with AMS.  She is improving from yesterday.  Able to have middle of conversation although still not making complete sense.  Patient still with confabulation.  At times during interview patient was seen visually hallucinating.  These hallucinations were deemed to be part of his altered mental status/delirious state and not thought to be psychiatric in origin.  At this point, patient is improving, although not yet at his baseline.  Likely still delirious.  Continue to search for ulterior reasons  of altered mental status.   Continue with IV Valproate TID- can be changed to 500 mg PO TID once able to tolerate PO medications  -valproate acid level 49 -LFTs- WNL  -Continue with IV Lorazepam PRN anxiety/agitation  -Hold Suboxone if giving opiate medications  -Patient should be started on antipsychotic medications when no longer delirious  Can start at Olanzapine 5mg  PO qhs If unable to take PO medications, can use IV thorazine 50mg  QHS  If patient requires medications for agitation, please use lower doses as patient still with AMS   Psychiatry will continue to follow

## 2019-06-28 NOTE — Progress Notes (Signed)
Patient is alert and oriented x 1 at times. Confused, delirious, nonsensical conversation, restless. Several attempts to climb out of bed requiring constant re-direction. Seroquel administered with no improvement. [place] for more assistance. She came to the bedside to assess patient.Ordered a sitter for him but there was not enough staff to fulfil the need. Patient received Fentanyl and benadryl with little improvement. RN sat at bedside charting to keep patient safe. Gave ativan since patient continued to climb out of bed requiring constant re-direction. Fell asleep for about an hour, woke up attempting to get out of bed. Re-direction was no possible. He was verbally threatening to hit staff., agitated and unsafe. Called hospital police to the bedside. Dr. Sidney Ace came to bedside and ordered IM Geodon that was administered. Sitter arrived for day shift.

## 2019-06-28 NOTE — Consult Note (Signed)
PHARMACY CONSULT NOTE - FOLLOW UP  Pharmacy Consult for Electrolyte Monitoring and Replacement   53 y/o M admitted with right lower extremity ischemia and right ankle fracture now s/p peripheral revascularization and ORIF. Patient has [name] history significant for schizophrenia and substance abuse. Post-operatively he became extremely agitated likely secondary to drug/alcohol withdrawal requiring high doses of sedatives. He is currently intubated and sedated in the ICU.   Update  -10/7: Patient extubated to [facility] -10/14: Transferred back to orthopedic floor from ICU  Recent Labs: Potassium (mmol/L)  Date Value  06/28/2019 4.6   Magnesium (mg/dL)  Date Value  06/28/2019 1.8   Calcium (mg/dL)  Date Value  06/28/2019 9.0   Albumin (g/dL)  Date Value  06/27/2019 2.4 (L)   Phosphorus (mg/dL)  Date Value  06/26/2019 3.3   Sodium (mmol/L)  Date Value  06/28/2019 140   Corrected calcium: 9.88 mg/dL  Electrolytes: Patient has [name] history of alcohol abuse. Baseline nutritional status un-clear. Goal of therapy to replete electrolytes to normal levels. Patient with good UOP. K 4.6 Mag 1.8 Scr 0.66   -No electrolyte supplementation indicated today   -BMP tomorrow AM  Constipation:  LBM 10/8. Constipating medications include hydromorphone, cyproheptadine. Rectal tube removed. C diff panel 10/6 negative. Patient has not had good PO intake. Senna one tab daily was started 10/13. Patient did not take PO meds yesterday 10/14 d/t AMS.  Glucose: No baseline A1c in chart and no history of DM recorded. Patient currently has [name] dysphagia 1 diet ordered but not eating much(nothing on 10/14).  Medications that could contribute to development of new-onset diabetes include quetiapine. . Finger sticks are within acceptable limits (68-91).     -Continue to monitor   Pharmacy will continue to monitor and adjust per consult.   Thomas [name], PharmD 06/28/2019 7:25 AM

## 2019-06-28 NOTE — Consult Note (Signed)
Consultation Note Date: 06/28/2019   Patient Name: Thomas [name]  DOB: 06-10-1966  MRN: [id number]  Age / Sex: 53 y.o., male  PCP: Patient, [name] Referring Physician: Hillary Bow, MD  Reason for Consultation: Establishing goals of care  HPI/Patient Profile: The patient is a 53 year old gentleman who sustained a right ankle fracture earlier today.  Is brought to the emergency room by EMS.  He subsequently underwent closed reduction in the emergency room. He required opetive management and revascularization. He was intubated and extubated when medically appropriate. Psychiatry has been following for agitation.   Clinical Assessment and Goals of Care: Patient is lying in bed. He has a Actuary at bedside, and is holding her hand in one of his, and has her badge in the other hand.   She states he has been seeing people and dogs. Much of what he says is not able to be understood. Could understand him to say " did you see what they did to that dog right there", pointing to the wall. He states "as long as my grandma ain't over there, we're good."   Per nursing, he is eating and drinking.    Spoke with his mother. She states Mr. Moure lives with his girlfriend of 11 years. He has never been married. She states she is attempting to get the phone number for Delrae Rend, his daughter.  Prior to this hospitalization, he had disability and did not work, but was functional. He was able to drive and do yard work. She states he is meticulous about his looks and his dentures.   She states he has drank ETOH off and on, and recently began drinking again. She is unsure of amounts or frequency. He smokes marijuana. He used crack remotely, around 30 years ago. He had been followed by a Suboxone clinic, but stopped the Suboxone 3 weeks before this admission.   She states the doctors have been keeping her updated. She will  consider GOC moving forward and his QOL. She is a retired Tree surgeon.  Meeting scheduled for 12:00 Monday. Discussed recommending Risperdal. She states he has tried this medication in the past, and does not know what happened on the medication or why it was stopped. She states she will check into this.    SUMMARY OF RECOMMENDATIONS   Mother is checking into previous outcomes with Risperdal.  Would consider initiating this at 0.16m BID, and increasing daily until cognitive status improves, depending on what she advises.   Would consider neurology consult.  [place].        Primary Diagnoses: Present on Admission: . Ischemia of extremity . Schizoaffective disorder, bipolar type ([place] . Acute respiratory failure ([place]   I have reviewed the medical record, interviewed the patient and family, and examined the patient. The following aspects are pertinent.  Past Medical History:  Diagnosis Date  . Alcohol abuse   . Anemia   . Bipolar 1 disorder ([place]   . Cervical spine fracture ([place]   . Chronic, continuous use  of opioids   . History of seizure   . HTN (hypertension)   . Insomnia   . MDD (major depressive disorder), recurrent, severe, with psychosis ([place])   . Pancreatitis    Social History   Socioeconomic History  . Marital status: Single    Spouse name: Not on file  . Number of children: Not on file  . Years of education: Not on file  . Highest education level: Not on file  Occupational History  . Not on file  Social Needs  . Financial resource strain: Not on file  . Food insecurity    Worry: Not on file    Inability: Not on file  . Transportation needs    Medical: Not on file    Non-medical: Not on file  Tobacco Use  . Smoking status: Current Every Day Smoker  . Smokeless tobacco: Never Used  Substance and Sexual Activity  . Alcohol use: Yes    Comment: occasionally  . Drug use: Not on file  . Sexual activity: Not on file  Lifestyle  . Physical activity     Days per week: Not on file    Minutes per session: Not on file  . Stress: Not on file  Relationships  . Social Herbalist on phone: Not on file    Gets together: Not on file    Attends religious service: Not on file    Active member of club or organization: Not on file    Attends meetings of clubs or organizations: Not on file    Relationship status: Not on file  Other Topics Concern  . Not on file  Social History Narrative  . Not on file   History reviewed. No pertinent family history. Scheduled Meds: . aspirin  81 mg Oral Daily  . baclofen  10 mg Per Tube TID  . buprenorphine-naloxone  1 tablet Sublingual TID  . chlorhexidine gluconate (MEDLINE KIT)  15 mL Mouth Rinse BID  . Chlorhexidine Gluconate Cloth  6 each Topical Daily  . clobetasol cream   Topical BID  . clopidogrel  75 mg Per Tube Daily  . cyproheptadine  4 mg Per Tube TID  . enoxaparin (LOVENOX) injection  40 mg Subcutaneous Q24H  . famotidine  20 mg Per Tube BID  . feeding supplement (NEPRO CARB STEADY)  237 mL Oral BID BM  . folic acid  1 mg Intravenous Daily  . influenza vac split quadrivalent PF  0.5 mL Intramuscular Tomorrow-1000  . mouth rinse  15 mL Mouth Rinse BID  . multivitamin  15 mL Per Tube Daily  . nicotine  14 mg Transdermal Daily  . OLANZapine  2.5 mg Oral Once  . polyethylene glycol  17 g Oral BID  . senna  1 tablet Oral Daily  . sodium chloride flush  10-40 mL Intracatheter Q12H  . thiamine injection  100 mg Intravenous Daily   Continuous Infusions: . sodium chloride 10 mL/hr at 06/27/19 0600  . ampicillin-sulbactam (UNASYN) IV 3 g (06/28/19 1308)  . valproate sodium 250 mg (06/28/19 0956)   PRN Meds:.sodium chloride, acetaminophen, LORazepam, metoprolol tartrate, sodium chloride flush, sodium chloride flush, ziprasidone Medications Prior to Admission:  Prior to Admission medications   Medication Sig Start Date End Date Taking? Authorizing Provider  albuterol (VENTOLIN HFA)  108 (90 Base) MCG/ACT inhaler Inhale 2 puffs into the lungs every 6 (six) hours as needed for wheezing. 02/24/19  Yes [provider]  doxepin (SINEQUAN) 100 MG capsule  Take 200 mg by mouth at bedtime. 04/26/19  Yes [provider]  pregabalin (LYRICA) 200 MG capsule Take 200 mg by mouth 3 (three) times daily. 01/25/19  Yes [provider]  SUBOXONE 8-2 MG FILM Place 1 Film under the tongue 3 (three) times daily. 05/22/19  Yes [provider]   Allergies  Allergen Reactions  . Haldol [Haloperidol Lactate]    Review of Systems  Unable to perform ROS   Physical Exam Pulmonary:     Effort: Pulmonary effort is normal.  Skin:    Comments: Cast in place on right leg.   Neurological:     Mental Status: He is alert.     Comments: Confused.      Vital Signs: BP 117/86   Pulse (!) 102   Temp 98.3 F (36.8 C) (Oral)   Resp 20   Ht 6' 0.01" (1.829 m)   Wt 68.8 kg   SpO2 91%   BMI 20.57 kg/m  Pain Scale: 0-10 POSS *See Group Information*: 1-Acceptable,Awake and alert Pain Score: 0-No pain   SpO2: SpO2: 91 % O2 Device:SpO2: 91 % O2 Flow Rate: .O2 Flow Rate (L/min): 2 L/min  IO: Intake/output summary:   Intake/Output Summary (Last 24 hours) at 06/28/2019 1356 Last data filed at 06/28/2019 1093 Gross per 24 hour  Intake 866.42 ml  Output 1100 ml  Net -233.58 ml    LBM: Last BM Date: 06/21/19 Baseline Weight: Weight: 76.6 kg Most recent weight: Weight: 68.8 kg     Palliative Assessment/Data:     Time In: 1:50 Time Out: 2:40 Time Total: 50 min Greater than 50%  of this time was spent counseling and coordinating care related to the above assessment and plan.  Signed by: Asencion Gowda, NP   Please contact Palliative Medicine Team phone at [phone] for questions and concerns.  For individual provider: See Shea Evans

## 2019-06-28 NOTE — Progress Notes (Signed)
[employer] - [place] [facility]   PATIENT NAME: Thomas [name]    MR#:  [id number]  DATE OF BIRTH:  1966/09/09  SUBJECTIVE:  CHIEF COMPLAINT:   Chief Complaint  Patient presents with  . Fall   Calm but confused. Poor PO intake. Sitter at bedside  REVIEW OF SYSTEMS:  Review of Systems  Unable to perform ROS: Critical illness   DRUG ALLERGIES:   Allergies  Allergen Reactions  . Haldol [Haloperidol Lactate]    VITALS:  Blood pressure 117/86, pulse (!) 102, temperature 98.3 F (36.8 C), temperature source Oral, resp. rate 20, height 6' 0.01" (1.829 m), weight 68.8 kg, SpO2 91 %.  PHYSICAL EXAMINATION:  Physical Exam  GENERAL:  53 y.o.-year-old patient lying in the bed. EYES: Pupils equal, round, reactive to light and accommodation. No scleral icterus. Extraocular muscles intact.  HEENT: Head atraumatic, normocephalic. Oropharynx and nasopharynx clear.  NECK:  Supple, no jugular venous distention. No thyroid enlargement, no tenderness.  LUNGS: Normal breath sounds bilaterally, no wheezing, rales,rhonchi or crepitation. No use of accessory muscles of respiration.  Decreased bibasilar breath sounds CARDIOVASCULAR: S1, S2 normal. No murmurs, rubs, or gallops.  ABDOMEN: Soft, nontender, nondistended. Bowel sounds present. No organomegaly or mass.  EXTREMITIES: No  cyanosis, or clubbing.  Right leg and ankle in a cast.   Good capillary refill is noted. NEUROLOGIC: sedated   PSYCHIATRIC: confused  Unable to assess. SKIN: No rash or ulcers  LABORATORY PANEL:   CBC Recent Labs  Lab 06/26/19 0435  WBC 7.1  HGB 9.4*  HCT 29.0*  PLT 344   ------------------------------------------------------------------------------------------------------------------  Chemistries  Recent Labs  Lab 06/27/19 0507 06/28/19 0509  NA 138 140  K 3.6 4.6  CL 102 100  CO2 25 28  GLUCOSE 88 91  BUN 16 13  CREATININE 0.70 0.66  CALCIUM 8.6* 9.0  MG  --  1.8  AST  40  --   ALT 34  --   ALKPHOS 102  --   BILITOT 0.8  --    ------------------------------------------------------------------------------------------------------------------  Cardiac Enzymes No results for input(s): TROPONINI in the last 168 hours. ------------------------------------------------------------------------------------------------------------------  RADIOLOGY:  Ct Head Wo Contrast  Result Date: 06/28/2019 CLINICAL DATA:  AMS. MD NOTE: 53 y.o. male with a known history of polysubstance abuse including alcohol, schizoaffective bipolar type, seizures, hypertension and insomnia who initially presented on 06/07/2019 following an episode of a fall. Patient had a RIGHT ankle fracture and RIGHT foot is seen. The patient went into alcohol withdrawal. EXAM: CT HEAD WITHOUT CONTRAST TECHNIQUE: Contiguous axial images were obtained from the base of the skull through the vertex without intravenous contrast. COMPARISON:  None. FINDINGS: Brain: No evidence of acute infarction, hemorrhage, hydrocephalus, extra-axial collection or mass lesion/mass effect. There is mild central cortical atrophy. Vascular: There is atherosclerotic calcification of the internal carotid arteries. No hyperdense vessels. Skull: Normal. Negative for fracture or focal lesion. Sinuses/Orbits: No acute finding. Other: There multiple age-indeterminate fractures of the nasal bones bilaterally. No significant soft tissue swelling. IMPRESSION: 1. No evidence for acute intracranial abnormality. 2. Bilateral nasal bone fractures, favored to be chronic. Electronically Signed   By: Norva Pavlov M.D.   On: 06/28/2019 12:26    EKG:   No orders found for this or any previous visit.  ASSESSMENT AND PLAN:   JamesRhodesis a53 y.o.malewith a known history of polysubstance abuse including alcohol,schizoaffective bipolar type,seizures,hypertension and insomnia who initially presented on 06/07/2019 following an episode of a  fall. Was  evaluated in the emergency room and found to have right ankle fracture as well as right foot ischemia.Went into alcohol withdrawal  *Acute inpatient delirium.   Ct head normal Getting TSH, Ammonia, B12. Folate Ativan PRN Discussed with Dr. Claris Gower of psychiatry  *Acute hypoxic respiratory failure secondary to pneumonia.  Possible aspiration.  Intubated for hypoxic respiratory failure and airway protection on 06/10/2019,  extubated on 06/20/2019. On nasal cannula. IV Unasyn.  Finished IV Bactrim Respiratory cultures with Klebsiella/stenotrophomonas Will stop abx tomorrow  *Right lower extremity ischemia -status post revascularization with angioplasty and stent placement by vascular surgery in right superficial femoral artery, right external iliac artery. -Continue aspirin and Plavix  *Right ankle fracture. Status post fall Patient status post open reduction with internal fixation of right ankle by orthopedic physician. -Patient will need physical therapy -On Suboxone as well  *History of schizoaffective bipolar type -Being followed by psychiatry service and managing psych meds.   -Also on IV Depacon for mood stabilization. Can be changed to oral medications once able to tolerate p.o.  *Alcohol abuse with delirium tremens and now acute metabolic encephalopathy and inpatient delirium -Off Precedex drip  Continue thiamine,multivitamin and folic acid.  *. DVT prophylaxis;Lovenox  Physical therapy consult and speech consult   Consulted palliative care for goals of care  All the records are reviewed and case discussed with Care Management/Social Worker Management plans discussed with the patient, family and they are in agreement.  CODE STATUS: Full code  TOTAL TIME TAKING CARE OF THIS PATIENT: 35 minutes.   POSSIBLE D/C IN ? DAYS, DEPENDING ON CLINICAL CONDITION.  Leia Alf Breezy Hertenstein M.D on 06/28/2019 at 1:48 PM  Between 7am to 6pm - Pager -  [phone]  After 6pm go to [email or web address] - password [email or web address] [facility]  Office  [phone]  CC: Primary care physician; Patient, [name]

## 2019-06-29 DIAGNOSIS — R410 Disorientation, unspecified: Secondary | ICD-10-CM

## 2019-06-29 LAB — CULTURE, BLOOD (ROUTINE X 2)
Culture: NO GROWTH
Culture: NO GROWTH
Special Requests: ADEQUATE

## 2019-06-29 LAB — BASIC METABOLIC PANEL
Anion gap: 9 (ref 5–15)
BUN: 14 mg/dL (ref 6–20)
CO2: 28 mmol/L (ref 22–32)
Calcium: 8.8 mg/dL — ABNORMAL LOW (ref 8.9–10.3)
Chloride: 101 mmol/L (ref 98–111)
Creatinine, Ser: 0.64 mg/dL (ref 0.61–1.24)
GFR calc Af Amer: >60 mL/min (ref >60)
GFR calc non Af Amer: >60 mL/min (ref >60)
Glucose, Bld: 99 mg/dL (ref 70–99)
Potassium: 4.5 mmol/L (ref 3.5–5.1)
Sodium: 138 mmol/L (ref 135–145)

## 2019-06-29 LAB — GLUCOSE, CAPILLARY: Glucose-Capillary: 108 mg/dL — ABNORMAL HIGH (ref 70–99)

## 2019-06-29 LAB — AMMONIA: Ammonia: 40 umol/L — ABNORMAL HIGH (ref 9–35)

## 2019-06-29 LAB — MAGNESIUM: Magnesium: 1.9 mg/dL (ref 1.7–2.4)

## 2019-06-29 LAB — PHOSPHORUS: Phosphorus: 5.2 mg/dL — ABNORMAL HIGH (ref 2.5–4.6)

## 2019-06-29 MED ORDER — ZIPRASIDONE MESYLATE 20 MG IM SOLR
10.0000 mg | Freq: Four times a day (QID) | INTRAMUSCULAR | Status: DC | PRN
  Administered 2019-06-30 – 2019-07-09 (×16): 10 mg via INTRAMUSCULAR
  Filled 2019-06-29 (×19): qty 20

## 2019-06-29 MED ORDER — OLANZAPINE 5 MG PO TBDP
5.0000 mg | ORAL_TABLET | Freq: Every day | ORAL | Status: DC
  Administered 2019-06-29: 5 mg via ORAL
  Filled 2019-06-29 (×2): qty 1

## 2019-06-29 MED ORDER — OLANZAPINE 5 MG PO TBDP
2.5000 mg | ORAL_TABLET | Freq: Three times a day (TID) | ORAL | Status: DC | PRN
  Administered 2019-06-30: 2.5 mg via ORAL
  Filled 2019-06-29 (×2): qty 0.5

## 2019-06-29 MED ORDER — LORAZEPAM 1 MG PO TABS
1.0000 mg | ORAL_TABLET | Freq: Four times a day (QID) | ORAL | Status: DC | PRN
  Administered 2019-07-04 – 2019-07-09 (×11): 1 mg via ORAL
  Filled 2019-06-29 (×16): qty 1

## 2019-06-29 MED ORDER — SODIUM CHLORIDE 0.9 % IV SOLN
50.0000 mg | Freq: Every evening | INTRAVENOUS | Status: DC | PRN
  Filled 2019-06-29: qty 2

## 2019-06-29 NOTE — Evaluation (Signed)
Physical Therapy Evaluation Patient Details Name: Thomas [name] MRN: [id number] DOB: 03-26-1966 Today's Date: 06/29/2019   History of Present Illness  53 y/o male with a tortuous hospitalization thus far.  He had a fall with R ankle fracture 9/24, underwent ORIF 9/25.  Was aggressive with staff and needed intubated 9/27, finally extubated 10/7.  He has been having issues with AMS/delerium and has not been appropriate for PT.  Frankly he was unable to fully participate with PT exam secondary to confusion but did show reasonable effort.   Clinical Impression  Pt has been hospitalized and unable to be seen by PT secondary to intubation and cognition for ~3 weeks, he was still lethargic and with AMS at time of this eval but was able to follow some simple instructions and participate somewhat.  He did surprisingly well getting up to EOB despite prolonged bed rest, however he showed poor safety or situational awareness and overall was limited with how much he was actually able to do.  He struggled to keep weight fully off R LE in standing, but with heavy reinforcement and cuing showed ability to lift and keep R toes off ground at EOB for ~30 seconds but was completely unable to attempt heel-toe or hop movement on the L.  Hoping that pt's cognition improves enough to be able to more fully and safely participate with PT. Per today's session he will not be able to go home safely and could not have assisted with transfer to recliner and maintained [place] precautions.      Follow Up Recommendations SNF(per progress and mental status)    Equipment Recommendations  Rolling walker with 5" wheels;3in1 (PT)(may need w/c or knee scooter per progress)    Recommendations for Other Services       Precautions / Restrictions Precautions Precautions: Fall Restrictions Weight Bearing Restrictions: Yes RLE Weight Bearing: Non weight bearing      Mobility  Bed Mobility Overal bed mobility: Needs  Assistance Bed Mobility: Supine to Sit;Sit to Supine     Supine to sit: Min assist Sit to supine: Mod assist   General bed mobility comments: Pt needing to direct assist to get to EOB seemingly more secondary to ability to follow through on transition as he was distracted and did not get up readily despite indications that he would/could  Transfers Overall transfer level: Needs assistance Equipment used: Rolling walker (2 wheeled) Transfers: Sit to/from Stand Sit to Stand: Min assist         General transfer comment: Pt was able to assume standing with only light assist, however he was not able to consistently keep weight fully off R LE.  With direct and consistent cuing he could transiently lift foot off floor  Ambulation/Gait             General Gait Details: Attempted to get pt to do simple heel-toe shuffe with L foot with assist to keep weight off R but pt was unable on both standing attempts  Stairs            Wheelchair Mobility    Modified Rankin (Stroke Patients Only)       Balance Overall balance assessment: Needs assistance   Sitting balance-Leahy Scale: Good Sitting balance - Comments: Pt able to maintain sitting at EOB w/o assist, but did not show good awareness in general and made multiple attempts to get to standing without regard for WBing, approrpaite use of UEs/walker, etc Needing direct cuing to curb impulsivity.  Standing balance-Leahy Scale: Fair Standing balance comment: Pt actually able to maintain static standing balance reasonably well, but was unable consistently keep weight off R and showed poor overall awareness                             Pertinent Vitals/Pain Pain Assessment: Faces Faces Pain Scale: Hurts a little bit    Home Living Family/patient expects to be discharged to:: Unsure Living Arrangements: Spouse/significant other               Additional Comments: Pt unable to give cogent answers    Prior  Function           Comments: per notes pt was not working but able to drive, work in yard, Psychiatric nurseetc     Hand Dominance        Extremity/Trunk Assessment                Communication   Communication: Other (comment)(pt with limited awareness, speech was inconsistent)  Cognition Arousal/Alertness: Lethargic Behavior During Therapy: Restless Overall Cognitive Status: Impaired/Different from baseline                                 General Comments: Pt with inability to hold meaningful conversation, struggles to follow instructions/cues      General Comments      Exercises     Assessment/Plan    PT Assessment Patient needs continued PT services  PT Problem List Decreased strength;Decreased range of motion;Decreased activity tolerance;Decreased mobility;Decreased balance;Decreased knowledge of use of DME;Decreased coordination;Decreased knowledge of precautions;Decreased safety awareness;Pain;Decreased cognition       PT Treatment Interventions DME instruction;Gait training;Functional mobility training;Therapeutic activities;Therapeutic exercise;Balance training;Neuromuscular re-education;Cognitive remediation;Patient/family education    PT Goals (Current goals can be found in the Care Plan section)  Acute Rehab PT Goals Patient Stated Goal: none stated PT Goal Formulation: Patient unable to participate in goal setting Time For Goal Achievement: 07/13/19 Potential to Achieve Goals: Fair    Frequency 7X/week   Barriers to discharge        Co-evaluation               AM-PAC PT "6 Clicks" Mobility  Outcome Measure Help needed turning from your back to your side while in a flat bed without using bedrails?: A Little Help needed moving from lying on your back to sitting on the side of a flat bed without using bedrails?: A Little Help needed moving to and from a bed to a chair (including a wheelchair)?: Total Help needed standing up from a chair  using your arms (e.g., wheelchair or bedside chair)?: A Little Help needed to walk in hospital room?: Total Help needed climbing 3-5 steps with a railing? : Total 6 Click Score: 12    End of Session Equipment Utilized During Treatment: Gait belt Activity Tolerance: Patient limited by lethargy Patient left: in bed;with call bell/phone within reach;with nursing/sitter in room Nurse Communication: Mobility status(sitter present t/o session, assisted with some mobility) PT Visit Diagnosis: Muscle weakness (generalized) (M62.81);Difficulty in walking, not elsewhere classified (R26.2);Pain;Unsteadiness on feet (R26.81) Pain - Right/Left: Right Pain - part of body: Ankle and joints of foot    Time: [id number] PT Time Calculation (min) (ACUTE ONLY): 27 min   Charges:   PT Evaluation $PT Eval Moderate Complexity: 1 Mod PT Treatments $Therapeutic Activity: 8-22 mins  Malachi Pro, DPT 06/29/2019, 1:38 PM

## 2019-06-29 NOTE — Progress Notes (Signed)
SLP Cancellation Note  Patient Details Name: Thomas [name] MRN: [id number] DOB: [birth year]   Cancelled treatment:       Reason Eval/Treat Not Completed: Fatigue/lethargy limiting ability to participate(chart reviewed; consulted NSG re: pt's status today) Pt is asleep, lethargic post Geodon, Ativan given for agitation. Discussed w/ NSG and did not recommend any po intake currently d/t risk for choking/aspiration. NSG updated ST stating pt's mental status yesterday was somewhat more appropriate requiring less medication for agitation. He was able to eat "some" at all 3 meals w/ No overt s/s of aspiration noted by NSG staff/Sitter. Pt' s labs and temp remain wnl; no need for O2 support.  Recommend that while pt's mental status remains this declined, and he is confused and agitated requiring sedating medication, a modified dysphagia diet would best serve his needs to reduce risk for choking/aspiration. Recommend continue the current diet w/ Supervision at mealtime, aspiration precautions and oral care. MD/NSG agreed. ST services will f/u next week for ongoing assessment of swallowing and of trials to upgrade diet consistency.     [name], [place], CCC-SLP Watson,Katherine 06/29/2019, 2:36 PM

## 2019-06-29 NOTE — TOC Progression Note (Addendum)
Transition of Care [facility]) - Progression Note    Patient Details  Name: Thomas [name] MRN: [id number] Date of Birth: [birth year]  Transition of Care [facility] CM/SW Contact  Ory Elting, Lenice Llamas Phone Number: [phone]  06/29/2019, 2:24 PM  Clinical Narrative: Clinical Social Worker (CSW) met with patient and his mother Elouise was at bedside. Elouise reported that she was able to contact patient's daughter Sallee Lange [phone] in [place], [place]. Per mother Yetta Flock wants to be involved. Mother reported that she will be happy to include Alyssa in the palliative care meeting on Monday 10/26 at 12 noon. CSW made mother aware that CSW has started paper work for SNF placement incase that is needed. Mother requested for CSW to call Alyssa and update her.   CSW contacted patient's daughter Yetta Flock and explained SNF process to her. CSW made her aware that under medicaid patient would have to sign over his SSI/ disability check, stay at the facility for 30 days and likely go outside of [facility]. Daughter verbalized her understanding. CSW asked daughter if she would like to be the main person to make medical decisions for her father. Daughter reported that she has no problem making decisions however she would go to patient's mother Elousie for advice because she has more experience in the medical field. Daughter requested to be involved however she prefers for patient's mother Elouise to be the main person to make medical decisions for patient.  CSW contacted Elouise and made her aware of above. CSW also made Elouise aware that Alyssa would like to dial into the palliative care meeting on Monday 10/26 at 12 noon. CSW will continue to follow and assist as needed.    4:33 pm: Patient's mother Elouise asked if she can get patient's wallet so she can pay his bills with his debit card. CSW discussed this with Medical laboratory scientific officer who contacted [place] with legal services. Per Eustaquio Maize we can't give the  mother patient's wallet unless she has POA. CSW called Elouise back and made her aware of above. Eloiuse reported that she does not have POA and verbalized her understanding.     Expected Discharge Plan: [place] Barriers to Discharge: Continued Medical Work up  Expected Discharge Plan and Services Expected Discharge Plan: [place] arrangements for the past 2 months: Single Family Home                                       Social Determinants of Health (SDOH) Interventions    Readmission Risk Interventions No flowsheet data found.

## 2019-06-29 NOTE — Progress Notes (Signed)
[employer] -  at [facility]   PATIENT NAME: Thomas [name]    MR#:  [id number]  DATE OF BIRTH:  1966-08-02  SUBJECTIVE:  CHIEF COMPLAINT:   Chief Complaint  Patient presents with  . Fall   Sedated after receiving Geodon for agitation. Sitter at bedside  Mother at bedside  REVIEW OF SYSTEMS:  Review of Systems  Unable to perform ROS: Critical illness   DRUG ALLERGIES:   Allergies  Allergen Reactions  . Haldol [Haloperidol Lactate]    VITALS:  Blood pressure 130/78, pulse 98, temperature 98.6 F (37 C), temperature source Oral, resp. rate 17, height 6' 0.01" (1.829 m), weight 74.8 kg, SpO2 96 %.  PHYSICAL EXAMINATION:  Physical Exam  GENERAL:  53 y.o.-year-old patient lying in the bed. EYES: Pupils equal, round, reactive to light and accommodation. No scleral icterus. Extraocular muscles intact.  HEENT: Head atraumatic, normocephalic. Oropharynx and nasopharynx clear.  NECK:  Supple, no jugular venous distention. No thyroid enlargement, no tenderness.  LUNGS: Normal breath sounds bilaterally, no wheezing, rales,rhonchi or crepitation. No use of accessory muscles of respiration.  Decreased bibasilar breath sounds CARDIOVASCULAR: S1, S2 normal. No murmurs, rubs, or gallops.  ABDOMEN: Soft, nontender, nondistended. Bowel sounds present. No organomegaly or mass.  EXTREMITIES: No  cyanosis, or clubbing.  Right leg and ankle in a cast.   Good capillary refill is noted. NEUROLOGIC: sedated   PSYCHIATRIC: confused  Unable to assess. SKIN: No rash or ulcers  LABORATORY PANEL:   CBC Recent Labs  Lab 06/26/19 0435  WBC 7.1  HGB 9.4*  HCT 29.0*  PLT 344   ------------------------------------------------------------------------------------------------------------------  Chemistries  Recent Labs  Lab 06/27/19 0507  06/29/19 0532  NA 138   < > 138  K 3.6   < > 4.5  CL 102   < > 101  CO2 25   < > 28  GLUCOSE 88   < > 99  BUN 16   < > 14   CREATININE 0.70   < > 0.64  CALCIUM 8.6*   < > 8.8*  MG  --    < > 1.9  AST 40  --   --   ALT 34  --   --   ALKPHOS 102  --   --   BILITOT 0.8  --   --    < > = values in this interval not displayed.   ------------------------------------------------------------------------------------------------------------------  Cardiac Enzymes No results for input(s): TROPONINI in the last 168 hours. ------------------------------------------------------------------------------------------------------------------  RADIOLOGY:  Ct Head Wo Contrast  Result Date: 06/28/2019 CLINICAL DATA:  AMS. MD NOTE: 53 y.o. male with a known history of polysubstance abuse including alcohol, schizoaffective bipolar type, seizures, hypertension and insomnia who initially presented on 06/07/2019 following an episode of a fall. Patient had a RIGHT ankle fracture and RIGHT foot is seen. The patient went into alcohol withdrawal. EXAM: CT HEAD WITHOUT CONTRAST TECHNIQUE: Contiguous axial images were obtained from the base of the skull through the vertex without intravenous contrast. COMPARISON:  None. FINDINGS: Brain: No evidence of acute infarction, hemorrhage, hydrocephalus, extra-axial collection or mass lesion/mass effect. There is mild central cortical atrophy. Vascular: There is atherosclerotic calcification of the internal carotid arteries. No hyperdense vessels. Skull: Normal. Negative for fracture or focal lesion. Sinuses/Orbits: No acute finding. Other: There multiple age-indeterminate fractures of the nasal bones bilaterally. No significant soft tissue swelling. IMPRESSION: 1. No evidence for acute intracranial abnormality. 2. Bilateral nasal bone fractures, favored to be chronic.  Electronically Signed   By: Nolon Nations M.D.   On: 06/28/2019 12:26    EKG:   Orders placed or performed during the hospital encounter of 06/07/19  . EKG 12-Lead  . EKG 12-Lead  . EKG 12-Lead  . EKG 12-Lead  . EKG 12-Lead  . EKG  12-Lead    ASSESSMENT AND PLAN:   JamesRhodesis a67 y.o.malewith a known history of polysubstance abuse including alcohol,schizoaffective bipolar type,seizures,hypertension and insomnia who initially presented on 06/07/2019 following an episode of a fall. Was evaluated in the emergency room and found to have right ankle fracture as well as right foot ischemia.Went into alcohol withdrawal  *Acute inpatient delirium.   Ct head normal-normal TSH, Ammonia, B12. Folate-normal Ativan, Geodon PRN Discussed with Dr. Claris Gower of psychiatry and Dr. Irish Elders of neurology  *Acute hypoxic respiratory failure secondary to pneumonia.  Possible aspiration.  Intubated for hypoxic respiratory failure and airway protection on 06/10/2019,  extubated on 06/20/2019. On nasal cannula. Finished IV Unasyn, IV Bactrim Respiratory cultures with Klebsiella/stenotrophomonas  *Right lower extremity ischemia -status post revascularization with angioplasty and stent placement by vascular surgery in right superficial femoral artery, right external iliac artery. -Continue aspirin and Plavix  *Right ankle fracture. Status post fall Patient status post open reduction with internal fixation of right ankle by orthopedic physician. -Patient will need physical therapy -On Suboxone as well  *History of schizoaffective bipolar type -Being followed by psychiatry service and managing psych meds.   -Also on IV Depacon for mood stabilization. Can be changed to oral medications once able to tolerate p.o.  *Alcohol abuse with delirium tremens and now acute metabolic encephalopathy and inpatient delirium -Off Precedex drip  Continue thiamine,multivitamin and folic acid.  *. DVT prophylaxis;Lovenox  Physical therapy consult and speech consult   Consulted palliative care for goals of care  All the records are reviewed and case discussed with Care Management/Social Worker   CODE STATUS: Full  code  Discussed with patient's mom at bedside and answered all questions  TOTAL TIME TAKING CARE OF THIS PATIENT: 35 minutes.   POSSIBLE D/C IN ? DAYS, DEPENDING ON CLINICAL CONDITION.  Thomas [name] M.D on 06/29/2019 at 12:39 PM  Between 7am to 6pm - Pager - [phone]  After 6pm go to [email or web address] - password [email or web address]  Office  [phone]  CC: Primary care physician; Patient, [name]

## 2019-06-29 NOTE — Consult Note (Signed)
PHARMACY CONSULT NOTE - FOLLOW UP  Pharmacy Consult for Electrolyte Monitoring and Replacement   53 y/o M admitted with right lower extremity ischemia and right ankle fracture now s/p peripheral revascularization and ORIF. Patient has a history significant for schizophrenia and substance abuse. Post-operatively he became extremely agitated likely secondary to drug/alcohol withdrawal requiring high doses of sedatives. He is currently intubated and sedated in the ICU.   Update  -10/7: Patient extubated to [facility]NC -10/14: Transferred back to orthopedic floor from ICU  Recent Labs: Potassium (mmol/L)  Date Value  06/29/2019 4.5   Magnesium (mg/dL)  Date Value  06/29/2019 1.9   Calcium (mg/dL)  Date Value  06/29/2019 8.8 (L)   Albumin (g/dL)  Date Value  06/27/2019 2.4 (L)   Phosphorus (mg/dL)  Date Value  06/29/2019 5.2 (H)   Sodium (mmol/L)  Date Value  06/29/2019 138   Corrected calcium: 9.88 mg/dL  Electrolytes: Patient has a history of alcohol abuse. Baseline nutritional status un-clear. Goal of therapy to replete electrolytes to normal levels. Patient off and on refusing PO meds K 4.5 Mag 1.9 Scr 0.64 Phos 5.2   -No electrolyte supplementation indicated today   -BMP tomorrow AM  Constipation:  LBM 10/16. Constipating medications include hydromorphone, cyproheptadine. Rectal tube removed. C diff panel 10/6 negative. Patient has not had good PO intake. Senna one tab daily was started 10/13-patient received dose that day but none since (refusing PO meds)  Glucose: No baseline A1c in chart and no history of DM recorded. Patient currently has a dysphagia 1 diet ordered but not eating much(nothing on 10/14).  Medications that could contribute to development of new-onset diabetes include quetiapine. . Finger sticks are within acceptable limits (75-99).     -Continue to monitor   Pharmacy will continue to monitor and adjust per consult.   Karene Bracken A, PharmD 06/29/2019  7:46 AM

## 2019-06-29 NOTE — Consult Note (Signed)
Patient is seen and examined.  Patient is a 53 year old male with a past psychiatric history significant for alcohol dependence, opiate dependence on maintenance therapy, reported schizoaffective disorder, seizure disorder, hypertension and insomnia who was admitted on 06/07/2019 secondary to vascular insufficiency in the lower extremity status post revascularization with angioplasty and stent placement by vascular surgery in the right superficial femoral artery and right external iliac artery.  He apparently went into alcohol withdrawal as well as polysubstance withdrawal and subsequent delirium.  On 06/28/2019 recommendations from psychiatry included IV valproic acid 3 times daily, IV lorazepam as needed for anxiety and agitation, holding his Suboxone, and recommendations to start oral olanzapine if possible, and using IV Thorazine 50 mg nightly if he refused orals.  Patient is seen this a.m., still delirious.  He is alert and oriented only to one.  Current medications include baclofen, Suboxone, lorazepam valproic acid as well as Geodon.  There is also oral Zyprexa that has been written for.  It does not appear as though he received the Thorazine.  Recent laboratories include metabolic panel from 55/97.  With a mildly elevated ammonia at 37.  A CBC from 10/13 that showed an anemia with a hemoglobin of 9.4 and hematocrit of 29.0.  His MCV was also elevated at 102.1.  Depakote level on 10/14 was 49.  TSH was normal at 2.991.  Urinalysis from 9/27 was essentially negative.  Drug screen on admission was significant for benzodiazepines, cannabinoids as well as tricyclic antidepressant .  Blood cultures reviewed from 9/30 revealed Klebsiella and stenotrophomonas Maltophlia.  On examination today the patient is alert and oriented only to one.  He is mildly sedated at this time.  Assessment #1 delirium most likely related to substance withdrawal as well as previous infection, pain medicines and debility.  He  appears to be calmer at this moment.  He apparently extubated himself and was agitated earlier.  I will go on and write for the Thorazine to be used if possible and if necessary.  I will change the Zyprexa to the dissolvable tablets, and hopefully you can get those in him versus injectables.  No change in the Suboxone at this time.  1.  Start Thorazine 75 mg IV q. at bedtime if necessary, use olanzapine 5 mg twice daily orally if possible. 2.  Continue Suboxone 8/2 mg 3 times daily. 3.  Continue Depakote 250 mg IV 3 times daily. 4.  Continue lorazepam 1 -2 mg 3 times daily as needed agitation. 5.  Repeat ammonia. 6.  Repeat urinalysis. 7.  Continue Geodon 10 mg every 12 hours as needed agitation. 8.  Continue to follow with you.

## 2019-06-30 LAB — GLUCOSE, CAPILLARY
Glucose-Capillary: 78 mg/dL (ref 70–99)
Glucose-Capillary: 87 mg/dL (ref 70–99)
Glucose-Capillary: 64 mg/dL — ABNORMAL LOW (ref 70–99)
Glucose-Capillary: 95 mg/dL (ref 70–99)

## 2019-06-30 LAB — BASIC METABOLIC PANEL
Anion gap: 12 (ref 5–15)
BUN: 18 mg/dL (ref 6–20)
CO2: 25 mmol/L (ref 22–32)
Calcium: 9.2 mg/dL (ref 8.9–10.3)
Chloride: 102 mmol/L (ref 98–111)
Creatinine, Ser: 0.71 mg/dL (ref 0.61–1.24)
GFR calc Af Amer: >60 mL/min (ref >60)
GFR calc non Af Amer: >60 mL/min (ref >60)
Glucose, Bld: 89 mg/dL (ref 70–99)
Potassium: 4.5 mmol/L (ref 3.5–5.1)
Sodium: 139 mmol/L (ref 135–145)

## 2019-06-30 MED ORDER — OLANZAPINE 5 MG PO TBDP
5.0000 mg | ORAL_TABLET | Freq: Two times a day (BID) | ORAL | Status: DC
  Administered 2019-06-30 – 2019-07-04 (×5): 5 mg via ORAL
  Filled 2019-06-30 (×9): qty 1

## 2019-06-30 MED ORDER — OLANZAPINE 5 MG PO TBDP
5.0000 mg | ORAL_TABLET | Freq: Three times a day (TID) | ORAL | Status: DC | PRN
  Administered 2019-07-01 – 2019-07-06 (×4): 5 mg via ORAL
  Filled 2019-06-30 (×5): qty 1

## 2019-06-30 MED ORDER — SODIUM CHLORIDE 0.9 % IV SOLN
75.0000 mg | Freq: Every evening | INTRAVENOUS | Status: DC | PRN
  Administered 2019-07-03: 22:00:00 75 mg via INTRAVENOUS
  Filled 2019-06-30 (×2): qty 3

## 2019-06-30 NOTE — Progress Notes (Signed)
Received order from Dr Posey Pronto to reorder 1:1 sitter for safety

## 2019-06-30 NOTE — Progress Notes (Signed)
Subjective: 22 Days Post-Op Procedure(s) (LRB): OPEN REDUCTION INTERNAL FIXATION (ORIF) ANKLE FRACTURE (Right)   Patient is sedated.  Mother and sitter reports that he awakens and speaks to them some.  He is now 3 weeks post ankle repair.  Cast change and staple removal today.     Objective:   VITALS:   Vitals:   06/29/19 2009 06/29/19 2353  BP: 104/81 105/80  Pulse: (!) 103 (!) 110  Resp: 14 14  Temp: 98.3 F (36.8 C) 98.4 F (36.9 C)  SpO2: 100% (!) 87%    Neurologically intact Neurovascular intact Incision: no drainage    LABS No results for input(s): HGB, HCT, WBC, PLT in the last 72 hours.  Recent Labs    06/28/19 0509 06/29/19 0532 06/30/19 0405  NA 140 138 139  K 4.6 4.5 4.5  BUN 13 14 18   CREATININE 0.66 0.64 0.71  GLUCOSE 91 99 89    No results for input(s): LABPT, INR in the last 72 hours.   Assessment/Plan: 22 Days Post-Op Procedure(s) (LRB): OPEN REDUCTION INTERNAL FIXATION (ORIF) ANKLE FRACTURE (Right)   Discharge to SNF   Cast was removed and the wounds were inspected.  He had excellent healing of both wounds with no swelling at all and no sign of infection.  All staples removed. A new well-padded short leg cast was applied. He is to remain nonweightbearing status. PT may work with him bed to chair and if he gets more cognizant and up on a walker nonweightbearing. Go to skilled nursing whenever feasible. DVT prophylaxis per vascular service Return to my clinic in 3 to 4 weeks.

## 2019-06-30 NOTE — Consult Note (Signed)
PHARMACY CONSULT NOTE - FOLLOW UP  Pharmacy Consult for Electrolyte Monitoring and Replacement   53 y/o M admitted with right lower extremity ischemia and right ankle fracture now s/p peripheral revascularization and ORIF. Patient has a history significant for schizophrenia and substance abuse. Post-operatively he became extremely agitated likely secondary to drug/alcohol withdrawal requiring high doses of sedatives. He is currently intubated and sedated in the ICU.   Update  -10/7: Patient extubated to HFNC -10/14: Transferred back to orthopedic floor from ICU  Recent Labs: Potassium (mmol/L)  Date Value  06/30/2019 4.5   Magnesium (mg/dL)  Date Value  06/29/2019 1.9   Calcium (mg/dL)  Date Value  06/30/2019 9.2   Albumin (g/dL)  Date Value  06/27/2019 2.4 (L)   Phosphorus (mg/dL)  Date Value  06/29/2019 5.2 (H)   Sodium (mmol/L)  Date Value  06/30/2019 139   Corrected calcium: 9.88 mg/dL  Electrolytes: Patient has a history of alcohol abuse. Baseline nutritional status un-clear. Goal of therapy to replete electrolytes to normal levels. Patient off and on refusing PO meds   -No electrolyte supplementation indicated today   -BMP tomorrow AM  Constipation:  LBM 10/16. Constipating medications include hydromorphone, cyproheptadine. Rectal tube removed. C diff panel 10/6 negative. Patient has not had good PO intake. Senna one tab daily was started 10/13-patient received dose that day but none since (refusing PO meds)  Glucose: No baseline A1c in chart and no history of DM recorded. Patient currently has a dysphagia 1 diet ordered but not eating much(nothing on 10/14).  Medications that could contribute to development of new-onset diabetes include quetiapine. . Finger sticks are within acceptable limits (75-99).     -Continue to monitor   Pharmacy will continue to monitor and adjust per consult.   Tawnya Crook, PharmD 06/30/2019 7:41 AM

## 2019-06-30 NOTE — Progress Notes (Signed)
[employer] - [place] at [facility]   PATIENT NAME: Thomas [name]    MR#:  [id number]  DATE OF BIRTH:  1965/12/28  SUBJECTIVE:   More talkative this am.  Thomas [name] at bedside  Mother at bedside  REVIEW OF SYSTEMS:  Review of Systems  Unable to perform ROS: Critical illness   DRUG ALLERGIES:   Allergies  Allergen Reactions  . Haldol [Haloperidol Lactate]    VITALS:  Blood pressure 105/80, pulse (!) 110, temperature 98.4 F (36.9 C), temperature source Oral, resp. rate 14, height 6' 0.01" (1.829 m), weight 80.3 kg, SpO2 (!) 87 %.  PHYSICAL EXAMINATION:  Physical Exam  GENERAL:  53 y.o.-year-old patient lying in the bed. EYES: Pupils equal, round, reactive to light and accommodation. No scleral icterus. Extraocular muscles intact.  HEENT: Head atraumatic, normocephalic. Oropharynx and nasopharynx clear.  NECK:  Supple, no jugular venous distention. No thyroid enlargement, no tenderness.  LUNGS: Normal breath sounds bilaterally, no wheezing, rales,rhonchi or crepitation. No use of accessory muscles of respiration.  Decreased bibasilar breath sounds CARDIOVASCULAR: S1, S2 normal. No murmurs, rubs, or gallops.  ABDOMEN: Soft, nontender, nondistended. Bowel sounds present. No organomegaly or mass.  EXTREMITIES: No  cyanosis, or clubbing.  Right leg and ankle in a cast.   Good capillary refill is noted. NEUROLOGIC: sedated   PSYCHIATRIC: confused  Unable to assess. SKIN: No rash or ulcers  LABORATORY PANEL:   CBC Recent Labs  Lab 06/26/19 0435  WBC 7.1  HGB 9.4*  HCT 29.0*  PLT 344   ------------------------------------------------------------------------------------------------------------------  Chemistries  Recent Labs  Lab 06/27/19 0507  06/29/19 0532 06/30/19 0405  NA 138   < > 138 139  K 3.6   < > 4.5 4.5  CL 102   < > 101 102  CO2 25   < > 28 25  GLUCOSE 88   < > 99 89  BUN 16   < > 14 18  CREATININE 0.70   < > 0.64 0.71  CALCIUM  8.6*   < > 8.8* 9.2  MG  --    < > 1.9  --   AST 40  --   --   --   ALT 34  --   --   --   ALKPHOS 102  --   --   --   BILITOT 0.8  --   --   --    < > = values in this interval not displayed.   ------------------------------------------------------------------------------------------------------------------  Cardiac Enzymes No results for input(s): TROPONINI in the last 168 hours. ------------------------------------------------------------------------------------------------------------------  RADIOLOGY:  No results found.  EKG:   Orders placed or performed during the hospital encounter of 06/07/19  . EKG 12-Lead  . EKG 12-Lead  . EKG 12-Lead  . EKG 12-Lead  . EKG 12-Lead  . EKG 12-Lead    ASSESSMENT AND PLAN:   JamesRhodesis a53 y.o.malewith a known history of polysubstance abuse including alcohol,schizoaffective bipolar type,seizures,hypertension and insomnia who initially presented on 06/07/2019 following an episode of a fall. Was evaluated in the emergency room and found to have right ankle fracture as well as right foot ischemia.Went into alcohol withdrawal  *Acute inpatient delirium.   Ct head normal-normal TSH, Ammonia, B12. Folate-normal Ativan, Geodon PRN Discussed with Dr. Cindi Carbon of psychiatry and Dr. Loretha Brasil of neurology  *Acute hypoxic respiratory failure secondary to pneumonia.  Possible aspiration.  Intubated for hypoxic respiratory failure and airway protection on 06/10/2019,  extubated on 06/20/2019. On nasal cannula.  Finished IV Unasyn, IV Bactrim Respiratory cultures with Klebsiella/stenotrophomonas  *Right lower extremity ischemia -status post revascularization with angioplasty and stent placement by vascular surgery in right superficial femoral artery, right external iliac artery. -Continue aspirin and Plavix  *Right ankle fracture. Status post fall Patient status post open reduction with internal fixation of right ankle by  orthopedic physician--staples removed and cast placed. Cont PT if pt's mentaion allows -Patient will need physical therapy -On Suboxone as well  *History of schizoaffective bipolar type -Being followed by psychiatry service and managing psych meds.   -Also on IV Depacon for mood stabilization. Can be changed to oral medications once able to tolerate p.o.  *Alcohol abuse with delirium tremens and now acute metabolic encephalopathy and inpatient delirium -Off Precedex drip  Continue thiamine,multivitamin and folic acid.  *DVT prophylaxis;Lovenox    Consulted palliative care for goals of care--meeting with mother and sister on Monday around noon  All the records are reviewed and case discussed with Care Management/Social Worker   CODE STATUS: Full code  Discussed with patient's mom at bedside and answered all questions  TOTAL TIME TAKING CARE OF THIS PATIENT: 35 minutes.   POSSIBLE D/C IN ? DAYS, DEPENDING ON CLINICAL CONDITION.  Fritzi Mandes M.D on 06/30/2019 at 2:45 PM  Between 7am to 6pm - Pager - [phone] After 6pm go to [email or web address] - password [email or web address]  Office  [phone]  CC: Primary care physician; Patient, [name]

## 2019-06-30 NOTE — Progress Notes (Signed)
PT Cancellation Note  Patient Details Name: Thomas [name] MRN: [id number] DOB: 12-22-1965   Cancelled Treatment:    Reason Eval/Treat Not Completed: Other (comment). Treatment attempted x 2. Initially, pt with MD and staff; second attempt, pt unable to participate due to lethargy/medically sedated. Re attempt at a later date as cognition allows.    Larae Grooms, PTA 06/30/2019, 1:16 PM

## 2019-06-30 NOTE — Consult Note (Addendum)
Patient is seen and examined by this provider in person.  He was able to ask how this provider was doing on assessment and when asked how he was doing he responded with fine.  After initial introduction client appeared to be sleeping.  Sitter at his bedside said he was agitated earlier today trying to get out of bed and was given Ativan with relief.  Prior to yesterday had not slept in 2 days slept some last night from 2 AM until 7 AM and continues to nap.  Medications adjusted to help with agitation and hopefully decrease the use of Ativan.  Continues to wait placement for a SNF.  HPI per Dr Jola Babinski:  Patient is a 53 year old male with a past psychiatric history significant for alcohol dependence, opiate dependence on maintenance therapy, reported schizoaffective disorder, seizure disorder, hypertension and insomnia who was admitted on 06/07/2019 secondary to vascular insufficiency in the lower extremity status post revascularization with angioplasty and stent placement by vascular surgery in the right superficial femoral artery and right external iliac artery.  He apparently went into alcohol withdrawal as well as polysubstance withdrawal and subsequent delirium.  On 06/28/2019 recommendations from psychiatry included IV valproic acid 3 times daily, IV lorazepam as needed for anxiety and agitation, holding his Suboxone, and recommendations to start oral olanzapine if possible, and using IV Thorazine 50 mg nightly if he refused orals.  Patient is seen this a.m., still delirious.  He is alert and oriented only to one.  Current medications include baclofen, Suboxone, lorazepam valproic acid as well as Geodon.  There is also oral Zyprexa that has been written for.  It does not appear as though he received the Thorazine.  Per Dr Jola Babinski updated by this NP: Recent laboratories include metabolic panel from 10/17 with all results WDL.  With a mildly elevated ammonia at 40.  Urinanalysis is clear. A CBC from 10/13 that  showed an anemia with a hemoglobin of 9.4 and hematocrit of 29.0.  His MCV was also elevated at 102.1.  Depakote level on 10/14 was 49.  TSH was normal at 2.991.  Urinalysis from 9/27 was essentially negative.  Drug screen on admission was significant for benzodiazepines, cannabinoids as well as tricyclic antidepressant .  Blood cultures reviewed from 9/30 revealed Klebsiella and stenotrophomonas Maltophlia.  Per Dr Jola Babinski, revised by this NP Assessment #1 delirium most likely related to substance withdrawal as well as previous infection, pain medicines and debility.  He appears to be calmer at this moment.  He apparently extubated himself and was agitated earlier.  I will go on and write for the Thorazine to be used if possible and if necessary.  I will change the Zyprexa to the dissolvable tablets, and hopefully you can get those in him versus injectables.  No change in the Suboxone at this time.  Continue Dr Susann Givens recommendations below (UA and ammonia level complete with U/A normal, ammonia level at 40): 1.  Continue Thorazine 75 mg IV q. at bedtime if necessary, use olanzapine 5 mg twice daily orally if possible. 2.  Continue Suboxone 8/2 mg 3 times daily. 3.  Continue Depakote 250 mg IV 3 times daily. 4.  Continue lorazepam 1 -2 mg 3 times daily as needed agitation. 7.  Continue Geodon 10 mg every 12 hours as needed agitation. 8.  Increase Zyprexa 2.5 mg TID PRN to 5 mg TID PRN for agitation 9.  Increase Zyprexa 5 mg at bedtime to BID 9.  Continue to follow with you.  [name], PMHNP  Case discussed and plan agreed upon as outlined above.

## 2019-07-01 LAB — GLUCOSE, CAPILLARY
Glucose-Capillary: 86 mg/dL (ref 70–99)
Glucose-Capillary: 77 mg/dL (ref 70–99)
Glucose-Capillary: 102 mg/dL — ABNORMAL HIGH (ref 70–99)

## 2019-07-01 MED ORDER — FOLIC ACID 1 MG PO TABS
1.0000 mg | ORAL_TABLET | Freq: Every day | ORAL | Status: DC
  Administered 2019-07-01 – 2019-07-09 (×8): 1 mg via ORAL
  Filled 2019-07-01 (×9): qty 1

## 2019-07-01 NOTE — Consult Note (Signed)
PHARMACY CONSULT NOTE - FOLLOW UP  Pharmacy Consult for Electrolyte Monitoring and Replacement   53 y/o M admitted with right lower extremity ischemia and right ankle fracture now s/p peripheral revascularization and ORIF. Patient has a history significant for schizophrenia and substance abuse. Post-operatively he became extremely agitated likely secondary to drug/alcohol withdrawal requiring high doses of sedatives. He is currently intubated and sedated in the ICU.   Update  -10/7: Patient extubated to [facility] -10/14: Transferred back to orthopedic floor from ICU  Recent Labs: Potassium (mmol/L)  Date Value  06/30/2019 4.5   Magnesium (mg/dL)  Date Value  06/29/2019 1.9   Calcium (mg/dL)  Date Value  06/30/2019 9.2   Albumin (g/dL)  Date Value  06/27/2019 2.4 (L)   Phosphorus (mg/dL)  Date Value  06/29/2019 5.2 (H)   Sodium (mmol/L)  Date Value  06/30/2019 139   Corrected calcium: 9.88 mg/dL  Electrolytes:  BMP ordered yesterday cancelled. Will sign off of consult at this time.   Tawnya Crook, PharmD 07/01/2019 7:45 AM

## 2019-07-01 NOTE — Progress Notes (Signed)
PT Cancellation Note  Patient Details Name: Thomas [name] MRN: [id number] DOB: [birth year]   Cancelled Treatment:    Reason Eval/Treat Not Completed: Patient's level of consciousness, Patient is asleep, nurse is with patient. Ativan given at 0630 due to agitation.    [address], [place], PT DPT 07/01/2019, 10:02 AM

## 2019-07-01 NOTE — Progress Notes (Signed)
[facility] [place] NAME: Thomas [name]    MR#:  [id number]  DATE OF BIRTH:  March 13, 1966  SUBJECTIVE:   More talkative this am. Patient has been restless agitated. Difficult to hold him in bed. Staff has been finding it very challenging to keep him calm very often Sitter at bedside   REVIEW OF SYSTEMS:  Review of Systems  Unable to perform ROS: Critical illness   DRUG ALLERGIES:   Allergies  Allergen Reactions  . Haldol [Haloperidol Lactate]    VITALS:  Blood pressure 136/77, pulse 98, temperature 97.7 F (36.5 C), temperature source Oral, resp. rate 17, height 6' 0.01" (1.829 m), weight 81.5 kg, SpO2 97 %.  PHYSICAL EXAMINATION:  Physical Exam  GENERAL:  53 y.o.-year-old patient lying in the bed. EYES: Pupils equal, round, reactive to light and accommodation. No scleral icterus. Extraocular muscles intact.  HEENT: Head atraumatic, normocephalic. Oropharynx and nasopharynx clear.  NECK:  Supple, no jugular venous distention. No thyroid enlargement, no tenderness.  LUNGS: Normal breath sounds bilaterally, no wheezing, rales,rhonchi or crepitation. No use of accessory muscles of respiration.  Decreased bibasilar breath sounds CARDIOVASCULAR: S1, S2 normal. No murmurs, rubs, or gallops.  ABDOMEN: Soft, nontender, nondistended. Bowel sounds present. No organomegaly or mass.  EXTREMITIES: No  cyanosis, or clubbing.  Right leg and ankle in a cast.   Good capillary refill is noted. NEUROLOGIC: sedated   PSYCHIATRIC: confused  Unable to assess. SKIN: No rash or ulcers  LABORATORY PANEL:   CBC Recent Labs  Lab 06/26/19 0435  WBC 7.1  HGB 9.4*  HCT 29.0*  PLT 344   ------------------------------------------------------------------------------------------------------------------  Chemistries  Recent Labs  Lab 06/27/19 0507  06/29/19 0532 06/30/19 0405  NA 138   < > 138 139  K 3.6   < > 4.5 4.5  CL 102   < > 101 102  CO2  25   < > 28 25  GLUCOSE 88   < > 99 89  BUN 16   < > 14 18  CREATININE 0.70   < > 0.64 0.71  CALCIUM 8.6*   < > 8.8* 9.2  MG  --    < > 1.9  --   AST 40  --   --   --   ALT 34  --   --   --   ALKPHOS 102  --   --   --   BILITOT 0.8  --   --   --    < > = values in this interval not displayed.   ------------------------------------------------------------------------------------------------------------------  Cardiac Enzymes No results for input(s): TROPONINI in the last 168 hours. ------------------------------------------------------------------------------------------------------------------  RADIOLOGY:  No results found.  EKG:   Orders placed or performed during the hospital encounter of 06/07/19  . EKG 12-Lead  . EKG 12-Lead  . EKG 12-Lead  . EKG 12-Lead  . EKG 12-Lead  . EKG 12-Lead    ASSESSMENT AND PLAN:   JamesRhodesis a48 y.o.malewith a known history of polysubstance abuse including alcohol,schizoaffective bipolar type,seizures,hypertension and insomnia who initially presented on 06/07/2019 following an episode of a fall. Was evaluated in the emergency room and found to have right ankle fracture as well as right foot ischemia.Went into alcohol withdrawal  *Acute inpatient delirium.   Ct head normal-normal TSH, Ammonia, B12. Folate-normal Ativan, Geodon PRN Discussed with Dr. Claris Gower of psychiatry and Dr. Irish Elders of neurology  *Acute hypoxic respiratory failure secondary to pneumonia.  Possible aspiration.  Intubated for hypoxic respiratory failure and airway protection on 06/10/2019,  extubated on 06/20/2019. On nasal cannula. Finished IV Unasyn, IV Bactrim Respiratory cultures with Klebsiella/stenotrophomonas  *Right lower extremity ischemia -status post revascularization with angioplasty and stent placement by vascular surgery in right superficial femoral artery, right external iliac artery. -Continue aspirin and Plavix  *Right ankle  fracture. Status post fall Patient status post open reduction with internal fixation of right ankle by orthopedic physician--staples removed and cast placed. Cont PT if pt's mentaion allows -Patient will need physical therapy -On Suboxone as well  *History of schizoaffective bipolar type -Being followed by psychiatry service and managing psych meds.   -Also on IV Depacon for mood stabilization. Can be changed to oral medications once able to tolerate p.o.  *Alcohol abuse with delirium tremens and now acute metabolic encephalopathy and inpatient delirium -Off Precedex drip  Continue thiamine,multivitamin and folic acid.  *DVT prophylaxis;Lovenox    Consulted palliative care for goals of care--meeting with mother and sister on Monday around noon  All the records are reviewed and case discussed with Care Management/Social Worker   CODE STATUS: Full code  Discussed with patient's mom at bedside and answered all questions  TOTAL TIME TAKING CARE OF THIS PATIENT: 35 minutes.   POSSIBLE D/C IN ? DAYS, DEPENDING ON CLINICAL CONDITION.  Thomas [name] M.D on 07/01/2019 at 2:30 PM  Between 7am to 6pm - Pager - [phone] After 6pm go to [email or web address] - password [email or web address]  [facility]  Office  [phone]  CC: Primary care physician; Patient, [name]

## 2019-07-01 NOTE — Progress Notes (Signed)
Upon entering the unit this am patient very aggitated. Trying to throw legs over bed rails, being very loud, agitated.   Sitter present.  Dr. Posey Pronto on unit to witness patient agitation. IV Ativan given at 0630.   At 0730 with assist of 3 staff members holding patient, IM geodon was given. Sitter remains with patient.

## 2019-07-01 NOTE — Consult Note (Addendum)
Patient is seen and examined by this provider in person.  He was very talkative on assessment today while trying to get out of bed.  Patient was also trying to convince this provider and his tech that he was medically stable and could go home.  Explained to him that this was not the case and the patient demanded we get his mother so he can go home.  RN had just giving him some agitation medicine.  Luckily, he is not strong enough at this time to be a threat to his sitter.  He did state that he was born in this hospital and name the hospital as [facility] which is not the case.  Continues to have confusion with lack of insight.  SNF still warranted.  HPI per Dr Mallie Darting:  Patient is a 53 year old male with a past psychiatric history significant for alcohol dependence, opiate dependence on maintenance therapy, reported schizoaffective disorder, seizure disorder, hypertension and insomnia who was admitted on 06/07/2019 secondary to vascular insufficiency in the lower extremity status post revascularization with angioplasty and stent placement by vascular surgery in the right superficial femoral artery and right external iliac artery.  He apparently went into alcohol withdrawal as well as polysubstance withdrawal and subsequent delirium.  On 06/28/2019 recommendations from psychiatry included IV valproic acid 3 times daily, IV lorazepam as needed for anxiety and agitation, holding his Suboxone, and recommendations to start oral olanzapine if possible, and using IV Thorazine 50 mg nightly if he refused orals.  Patient is seen this a.m., still delirious.  He is alert and oriented only to one.  Current medications include baclofen, Suboxone, lorazepam valproic acid as well as Geodon.  There is also oral Zyprexa that has been written for.  It does not appear as though he received the Thorazine.  Per Dr Mallie Darting updated by this NP: Recent laboratories include metabolic panel from 16/10 with all results [facility].  With a mildly  elevated ammonia at 40.  Urinanalysis is clear. A CBC from 10/13 that showed an anemia with a hemoglobin of 9.4 and hematocrit of 29.0.  His MCV was also elevated at 102.1.  Depakote level on 10/14 was 49.  TSH was normal at 2.991.  Urinalysis from 9/27 was essentially negative.  Drug screen on admission was significant for benzodiazepines, cannabinoids as well as tricyclic antidepressant .  Blood cultures reviewed from 9/30 revealed Klebsiella and stenotrophomonas Maltophlia.  Per Dr Mallie Darting, revised by this NP Assessment #1 delirium most likely related to substance withdrawal as well as previous infection, pain medicines and debility.  He appears to be calmer at this moment.  He apparently extubated himself and was agitated earlier.  I will go on and write for the Thorazine to be used if possible and if necessary.  I will change the Zyprexa to the dissolvable tablets, and hopefully you can get those in him versus injectables.  No change in the Suboxone at this time.  Continue Dr Karmen Stabs recommendations below (UA and ammonia level complete with U/A normal, ammonia level at 40): 1.  Continue Thorazine 75 mg IV q. at bedtime if necessary, use olanzapine 5 mg twice daily orally if possible. 2.  Continue Suboxone 8/2 mg 3 times daily. 3.  Continue Depakote 250 mg IV 3 times daily. 4.  Continue lorazepam 1 -2 mg 3 times daily as needed agitation. 7.  Continue Geodon 10 mg every 12 hours as needed agitation. 8.  Continue Zyprexa 5 mg TID PRN for agitation 9.  Continue  Zyprexa 5 mg at bedtime to BID 9.  Continue to follow with you.  Nanine Means, PMHNP  Case discussed and plan agreed upon as outlined above.

## 2019-07-02 DIAGNOSIS — Z7189 Other specified counseling: Secondary | ICD-10-CM

## 2019-07-02 MED ORDER — LORAZEPAM 2 MG/ML IJ SOLN
4.0000 mg | Freq: Once | INTRAMUSCULAR | Status: AC
  Administered 2019-07-02: 4 mg via INTRAVENOUS
  Filled 2019-07-02: qty 2

## 2019-07-02 MED ORDER — LORAZEPAM 2 MG/ML IJ SOLN: 2.0000 mg | Freq: Once | INTRAMUSCULAR | Status: DC

## 2019-07-02 NOTE — Consult Note (Signed)
Reassessd on 10/19:  Writer went to see patient after being called for acute agitation.  At this time patient was not acutely agitated rather more confused and was able to be calmed down with a phone call to home.  This is not psychotic level agitation.  This is more likely delirium.  See recommendations below     "Patient is seen and examined by this provider in person.  He was very talkative on assessment today while trying to get out of bed.  Patient was also trying to convince this provider and his tech that he was medically stable and could go home.  Explained to him that this was not the case and the patient demanded we get his mother so he can go home.  RN had just giving him some agitation medicine.  Luckily, he is not strong enough at this time to be a threat to his sitter.  He did state that he was born in this hospital and name the hospital as [facility] which is not the case.  Continues to have confusion with lack of insight.  SNF still warranted.  HPI per Dr Jola Babinski:  Patient is a 53 year old male with a past psychiatric history significant for alcohol dependence, opiate dependence on maintenance therapy, reported schizoaffective disorder, seizure disorder, hypertension and insomnia who was admitted on 06/07/2019 secondary to vascular insufficiency in the lower extremity status post revascularization with angioplasty and stent placement by vascular surgery in the right superficial femoral artery and right external iliac artery.  He apparently went into alcohol withdrawal as well as polysubstance withdrawal and subsequent delirium.  On 06/28/2019 recommendations from psychiatry included IV valproic acid 3 times daily, IV lorazepam as needed for anxiety and agitation, holding his Suboxone, and recommendations to start oral olanzapine if possible, and using IV Thorazine 50 mg nightly if he refused orals.  Patient is seen this a.m., still delirious.  He is alert and oriented only to one.  Current  medications include baclofen, Suboxone, lorazepam valproic acid as well as Geodon.  There is also oral Zyprexa that has been written for.  It does not appear as though he received the Thorazine.  Per Dr Jola Babinski updated by this NP: Recent laboratories include metabolic panel from 10/17 with all results [facility].  With a mildly elevated ammonia at 40.  Urinanalysis is clear. A CBC from 10/13 that showed an anemia with a hemoglobin of 9.4 and hematocrit of 29.0.  His MCV was also elevated at 102.1.  Depakote level on 10/14 was 49.  TSH was normal at 2.991.  Urinalysis from 9/27 was essentially negative.  Drug screen on admission was significant for benzodiazepines, cannabinoids as well as tricyclic antidepressant .  Blood cultures reviewed from 9/30 revealed Klebsiella and stenotrophomonas Maltophlia.  Per Dr Jola Babinski, revised by this NP Assessment #1 delirium most likely related to substance withdrawal as well as previous infection, pain medicines and debility.  He appears to be calmer at this moment.  He apparently extubated himself and was agitated earlier.  I will go on and write for the Thorazine to be used if possible and if necessary.  I will change the Zyprexa to the dissolvable tablets, and hopefully you can get those in him versus injectables.  No change in the Suboxone at this time."  Continue Dr Susann Givens recommendations below (UA and ammonia level complete with U/A normal, ammonia level at 40): 1.  Continue Thorazine 75 mg IV q. at bedtime if necessary, use olanzapine 5 mg twice  daily orally if possible. 2.  Continue Suboxone 8/2 mg 3 times daily. 3.  Continue Depakote 250 mg IV 3 times daily. 4.  Continue lorazepam 1 -2 mg 3 times daily as needed agitation. 7.  Continue Geodon IM  10 mg every 6 hours as needed agitation. 8.  Continue Zyprexa 5 mg TID PRN for agitation 9.  Continue Zyprexa 5 mg at bedtime to BID 9.  Continue to follow with you.

## 2019-07-02 NOTE — Progress Notes (Signed)
Patient is being combative and non cooperative.  IM geodon given per order.  Refusing PO meds.

## 2019-07-02 NOTE — Progress Notes (Signed)
PT Cancellation Note  Patient Details Name: Thomas [name] MRN: [id number] DOB: February 27, 1966   Cancelled Treatment:    Reason Eval/Treat Not Completed: Fatigue/lethargy limiting ability to participate. Pt medicated due to agitated/aggressive behavior; lethargic/obtunded and unable to participate in PT at this time. Re attempt at a later date when pt level of consciousness allows for PT participation.     Larae Grooms, PTA 07/02/2019, 1:01 PM
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
## 2019-07-02 NOTE — Progress Notes (Signed)
[place] at [place] NAME: Thomas [name]    MR#:  [id number]  DATE OF BIRTH:  [birth year]  SUBJECTIVE:  year when I went to see him since he received Geodon at 6 AM. However nurse called me later that patient is agitated and fighting. Sitter at bedside   REVIEW OF SYSTEMS:  Review of Systems  Unable to perform ROS: Critical illness   DRUG ALLERGIES:   Allergies  Allergen Reactions  . Haldol [Haloperidol Lactate]    VITALS:  Blood pressure 107/90, pulse (!) 108, temperature (!) 97.3 F (36.3 C), temperature source Oral, resp. rate 17, height 6' 0.01" (1.829 m), weight 85 kg, SpO2 97 %.  PHYSICAL EXAMINATION:  Physical Exam  GENERAL:  53 y.o.-year-old patient lying in the bed. EYES: Pupils equal, round, reactive to light and accommodation. No scleral icterus. Extraocular muscles intact.  HEENT: Head atraumatic, normocephalic. Oropharynx and nasopharynx clear.  NECK:  Supple, no jugular venous distention. No thyroid enlargement, no tenderness.  LUNGS: Normal breath sounds bilaterally, no wheezing, rales,rhonchi or crepitation. No use of accessory muscles of respiration.  Decreased bibasilar breath sounds CARDIOVASCULAR: S1, S2 normal. No murmurs, rubs, or gallops.  ABDOMEN: Soft, nontender, nondistended. Bowel sounds present. No organomegaly or mass.  EXTREMITIES: No  cyanosis, or clubbing.   Right leg and ankle in a cast.    NEUROLOGIC: sedated   PSYCHIATRIC: confused  Unable to assess. SKIN: No rash or ulcers  LABORATORY PANEL:   CBC Recent Labs  Lab 06/26/19 0435  WBC 7.1  HGB 9.4*  HCT 29.0*  PLT 344   ------------------------------------------------------------------------------------------------------------------  Chemistries  Recent Labs  Lab 06/27/19 0507  06/29/19 0532 06/30/19 0405  NA 138   < > 138 139  K 3.6   < > 4.5 4.5  CL 102   < > 101 102  CO2 25   < > 28 25  GLUCOSE 88   < > 99 89  BUN 16   < >  14 18  CREATININE 0.70   < > 0.64 0.71  CALCIUM 8.6*   < > 8.8* 9.2  MG  --    < > 1.9  --   AST 40  --   --   --   ALT 34  --   --   --   ALKPHOS 102  --   --   --   BILITOT 0.8  --   --   --    < > = values in this interval not displayed.   ------------------------------------------------------------------------------------------------------------------  Cardiac Enzymes No results for input(s): TROPONINI in the last 168 hours. ------------------------------------------------------------------------------------------------------------------  RADIOLOGY:  No results found.  EKG:   Orders placed or performed during the hospital encounter of 06/07/19  . EKG 12-Lead  . EKG 12-Lead  . EKG 12-Lead  . EKG 12-Lead  . EKG 12-Lead  . EKG 12-Lead    ASSESSMENT AND PLAN:   JamesRhodesis a53 y.o.malewith a known history of polysubstance abuse including alcohol,schizoaffective bipolar type,seizures,hypertension and insomnia who initially presented on 06/07/2019 following an episode of a fall. Was evaluated in the emergency room and found to have right ankle fracture as well as right foot ischemia.Went into alcohol withdrawal  *Acute Delirium.   -CT head normal-normal -TSH, Ammonia, B12. Folate-normal -Ativan, Geodon PRN -Discussed with Dr.Cristofano of psychiatry and Dr. Irish Elders of neurology  *Acute hypoxic respiratory failure secondary to pneumonia.  Possible aspiration. - Intubated for hypoxic respiratory failure and  airway protection on 06/10/2019, extubated on 06/20/2019. -On nasal cannula. -Finished IV Unasyn, IV Bactrim -Respiratory cultures with Klebsiella/stenotrophomonas.  *Right lower extremity ischemia -status post revascularization with angioplasty and stent placement by vascular surgery in right superficial femoral artery, right external iliac artery. -Continue aspirin and Plavix  *Right ankle fracture. Status post fall -Patient status post open  reduction with internal fixation of right ankle by orthopedic physician--staples removed and cast placed.  -Cont PT if pt's mentation allows -On Suboxone as well  *History of schizoaffective bipolar type -Being followed by psychiatry service and managing psych meds.   -Also on IV Depacon for mood stabilization. Can be changed to oral medications once able to tolerate p.o. -d/w dr Cindi Carbon on oct 19th--OK to cont geodon q 6 hourly prn (since that is the only med keeps him calm)  *Alcohol abuse with delirium tremens and now acute metabolic encephalopathy and inpatient delirium -Off Precedex drip  -Continue thiamine,multivitamin and folic acid.  *DVT prophylaxis;Lovenox    Consulted palliative care for goals of care--meeting with mother and sister on Monday around noon CSW working on placement  CODE STATUS: Full code    TOTAL TIME TAKING CARE OF THIS PATIENT: 25 minutes.   POSSIBLE D/C IN ? DAYS, DEPENDING ON CLINICAL CONDITION.  Enedina Finner M.D on 07/02/2019 at 1:08 PM  Between 7am to 6pm - Pager - [phone] After 6pm go to [email or web address] - password [email or web address]  [facility]  Office  [phone]  CC: Primary care physician; Patient, [name]

## 2019-07-02 NOTE — TOC Progression Note (Signed)
Transition of Care [facility]) - Progression Note    Patient Details  Name: Thomas [name] MRN: [id number] Date of Birth: September 18, 1965  Transition of Care [facility]) CM/SW Contact  Su Hilt, RN Phone Number: 07/02/2019, 2:12 PM  Clinical Narrative:     I met with the patient with his mother and Palliative in the room and we spoke at length.  The patient was able to tell me his name and birthday.  He did provide accurate information but then some information that he provided was not accurate and at times not even on topic.  He started saying words that did not make sense and when questioned he stated he was speaking [place], [place] and [place]. He changed topics of conversation often and spoke very quickly.  He gave random answers for questions that often times per his mother was not accurate or true.   I reached out to Psych to see if they had a recommendation and was told by Myles Lipps MD that he is not at [facility] today and they could not provide a recommendation. I spoke with the mother outside the room along with Palliative and explained that there are no SNF bed offers and that I would speak with Psych about if the recommendation would be a Psych bed or not and that we would move forward from there.  The patient is non weight bearing which will make finding a bed for Psych very difficult.  Will continue to monitor and seek a recommendation from Psych.  Expected Discharge Plan: [place] Barriers to Discharge: Continued Medical Work up  Expected Discharge Plan and Services Expected Discharge Plan: [place] arrangements for the past 2 months: Single Family Home                                       Social Determinants of Health (SDOH) Interventions    Readmission Risk Interventions No flowsheet data found.

## 2019-07-02 NOTE — TOC Progression Note (Signed)
Transition of Care [facility]) - Progression Note    Patient Details  Name: Thomas [name] MRN: [id number] Date of Birth: [birth year]  Transition of Care [facility]) CM/SW Contact  Cadell Gabrielson, Lenice Llamas Phone Number: [phone]  07/02/2019, 3:20 PM  Clinical Narrative: Clinical Social Worker ([place]) received a call from level 2 PASARR screener Louis Matte [phone] requesting that updated clinicals be faxed to her at [phone]. CSW faxed requested clinicals. PASARR is still pending.        Expected Discharge Plan: [place] Barriers to Discharge: Continued Medical Work up  Expected Discharge Plan and Services Expected Discharge Plan: [place] arrangements for the past 2 months: Single Family Home                                       Social Determinants of Health (SDOH) Interventions    Readmission Risk Interventions No flowsheet data found.

## 2019-07-02 NOTE — Progress Notes (Signed)
Patient is acting out verbal threats trying to jump out of bed and becoming combative.  IM geodon administered.

## 2019-07-03 MED ORDER — MIDAZOLAM HCL 2 MG/2ML IJ SOLN
1.0000 mg | Freq: Once | INTRAMUSCULAR | Status: DC
  Filled 2019-07-03: qty 1

## 2019-07-03 MED ORDER — HALOPERIDOL LACTATE 5 MG/ML IJ SOLN: 2.0000 mg | Freq: Once | INTRAMUSCULAR | Status: DC

## 2019-07-03 MED ORDER — CLOPIDOGREL BISULFATE 75 MG PO TABS
75.0000 mg | ORAL_TABLET | Freq: Every day | ORAL | Status: DC
  Administered 2019-07-03 – 2019-07-09 (×7): 75 mg via ORAL
  Filled 2019-07-03 (×7): qty 1

## 2019-07-03 MED ORDER — DIPHENHYDRAMINE HCL 50 MG/ML IJ SOLN
12.5000 mg | Freq: Once | INTRAMUSCULAR | Status: AC
  Administered 2019-07-03: 04:00:00 12.5 mg via INTRAVENOUS
  Filled 2019-07-03: qty 1

## 2019-07-03 MED ORDER — DROPERIDOL 2.5 MG/ML IJ SOLN
2.5000 mg | Freq: Once | INTRAMUSCULAR | Status: AC
  Administered 2019-07-03: 2.5 mg via INTRAVENOUS
  Filled 2019-07-03: qty 1

## 2019-07-03 NOTE — Progress Notes (Signed)
Patient became extremely agitated using multiple expletives and became climbing out of bed, stating "I am going home" and continues to scream "[place]". Unable to redirect patient despite multiple attempts, pt then started swinging at staff, hospital police called, pt became extremely agitated using racial slurs and calling everyone a "motherfucking crackhead" MD Marcille Blanco paged, Shanon Brow [facility] was attached to the page, Benadryl and Droperidol ordered and administered, with little to no effect. Pt continues to scream and pulled IV pole from bed and swung at sitter, despite wearing mitts. MD and pharmacist notified and PRN versed order, will administered med and update on effects.

## 2019-07-03 NOTE — Progress Notes (Signed)
Patient ID: Thomas [name], male   DOB: 1966/07/04, 53 y.o.   MRN: [id number]  Patient seen in the ICU. He is calm and alert at present. Sitter in the room. Case discussed again with psychiatry Dr. Claris Gower. He thinks this is ICU related delirium. Need to continue present meds. Discussed with patient's mother at length on the phone. At present social worker is working on getting outpatient facility/place which can take psychiatry patient with fracture. Patient is nonweightbearing which will make finding a bed for psych very difficult.  According to the psychiatrist patient is not eligible for inpatient behavioral medicine unit transfer.  D/w dr Mortimer Fries and Dr Claris Gower

## 2019-07-03 NOTE — Consult Note (Signed)
  Seen on 07/03/2019:  Patient was sedated upon arrival. Sleeping comfortably in bed. Per ICU nurse report, patient was able to take some PO medications in apple sauce. Per report patient still was confused and confabulating when awake but not agitated or aggressive.   It was discussed with nursing staff the need to give antipsychotics around the clock to get ahead of agitation. Plan to administer IV thorazine if patient is unable to take PO medications also discussed.     Recommendations:  -Zyprexa 5mg  PO BID -Zyprexa 5mg  PO PRN TID agitation -Continue lorazepam 1 -2 mg 3 times daily as needed agitation.  -Continue Suboxone 8/2 mg 3 times daily -Continue Depakote 250 mg IV 3 times daily.  -Continue Geodon 10 mg IM every 6 hours as needed agitation if patient is unable to take PO  -Continue Thorazine 75 mg IV q. at bedtime if patient doesn't take his qhs zyprexa.  -Psych will continue to follow

## 2019-07-03 NOTE — Progress Notes (Signed)
Pt has been realitively calm and cooperative; however, he has refused to wear the BP cuff. Pt was normotensive upon arrival to the unit. Pt has been taking oral medications with minimal coaching.

## 2019-07-03 NOTE — Progress Notes (Signed)
Physical Therapy Discharge Patient Details Name: Thomas [name] MRN: [id number] DOB: 1965/11/14 Today's Date: 07/03/2019 Time:  -     Patient discharged from PT services secondary to transfer to ICU.  Will await new orders and continue as appropriate.   Chesley Noon 07/03/2019, 8:28 AM

## 2019-07-03 NOTE — Progress Notes (Addendum)
[place] [name] at [place] NAME: Thomas [name]    MR#:  [id number]  DATE OF BIRTH:  June 24, 1966  SUBJECTIVE:   Pt resting now. Per staff had a very rough night. Security was called. Sitter in the room REVIEW OF SYSTEMS:  Review of Systems  Unable to perform ROS: Mental status change   DRUG ALLERGIES:   Allergies  Allergen Reactions  . Haldol [Haloperidol Lactate]    VITALS:  Blood pressure 126/80, pulse 74, temperature 97.7 F (36.5 C), temperature source Oral, resp. rate 15, height 6' 0.01" (1.829 m), weight 77.1 kg, SpO2 92 %.  PHYSICAL EXAMINATION:  Physical Exam Limited exam due to pt cooperation GENERAL:  53 y.o.-year-old patient lying in the bed.sleeping LUNGS: Normal breath sounds bilaterally, no wheezing, rales,rhonchi or crepitation. No use of accessory muscles of respiration.  Decreased bibasilar breath sounds CARDIOVASCULAR: S1, S2 normal. No murmurs, rubs, or gallops.  ABDOMEN: Soft, nontender, nondistended. Bowel sounds present. No organomegaly or mass.  EXTREMITIES: No  cyanosis, or clubbing.   Right leg and ankle in a cast.    NEUROLOGIC: sedated   PSYCHIATRIC: confused  Unable to assess.  LABORATORY PANEL:   CBC No results for input(s): WBC, HGB, HCT, PLT in the last 168 hours. ------------------------------------------------------------------------------------------------------------------  Chemistries  Recent Labs  Lab 06/27/19 0507  06/29/19 0532 06/30/19 0405  NA 138   < > 138 139  K 3.6   < > 4.5 4.5  CL 102   < > 101 102  CO2 25   < > 28 25  GLUCOSE 88   < > 99 89  BUN 16   < > 14 18  CREATININE 0.70   < > 0.64 0.71  CALCIUM 8.6*   < > 8.8* 9.2  MG  --    < > 1.9  --   AST 40  --   --   --   ALT 34  --   --   --   ALKPHOS 102  --   --   --   BILITOT 0.8  --   --   --    < > = values in this interval not displayed.    ------------------------------------------------------------------------------------------------------------------  Cardiac Enzymes No results for input(s): TROPONINI in the last 168 hours. ------------------------------------------------------------------------------------------------------------------  RADIOLOGY:  No results found.  EKG:   Orders placed or performed during the hospital encounter of 06/07/19  . EKG 12-Lead  . EKG 12-Lead  . EKG 12-Lead  . EKG 12-Lead  . EKG 12-Lead  . EKG 12-Lead    ASSESSMENT AND PLAN:   JamesRhodesis a57 y.o.malewith a known history of polysubstance abuse including alcohol,schizoaffective bipolar type,seizures,hypertension and insomnia who initially presented on 06/07/2019 following an episode of a fall. Was evaluated in the emergency room and found to have right ankle fracture as well as right foot ischemia.Went into alcohol withdrawal  *Acute Delirium in the setting of severe schizoaffective/bipolar issue  -CT head normal-normal -TSH, Ammonia, B12. Folate-normal  *History of schizoaffective bipolar type -Being followed by psychiatry service and managing psych meds.   -Also on IV Depacon for mood stabilization. -d/w dr Claris Gower on oct 19th--OK to cont geodon q 6 hourly prn (since that is the only med keeps him calm) -IV Thorazine prn, zyprexa, ativan, versed -Now being transferred to ICU--ordered by Dr Marcille Blanco -pt did not get his Thorazine 75 mg as ordered by psych last pm when he was agitated--?reason not known  *Acute hypoxic  respiratory failure secondary to pneumonia.  Possible aspiration. - Intubated for hypoxic respiratory failure and airway protection on 06/10/2019, extubated on 06/20/2019. -Finished IV Unasyn, IV Bactrim -Respiratory cultures with Klebsiella/stenotrophomonas.  *Right lower extremity ischemia -status post revascularization with angioplasty and stent placement by vascular surgery in right superficial  femoral artery, right external iliac artery. -Continue aspirin and Plavix  *Right ankle fracture. Status post fall -Patient status post open reduction with internal fixation of right ankle by orthopedic physician--staples removed and cast placed.  -Cont PT if pt's mentation allows -On Suboxone as well  *Alcohol abuse with delirium tremens and now acute metabolic encephalopathy and  inpatient delirium -Continue thiamine,multivitamin and folic acid.  *DVT prophylaxis;Lovenox   Consulted palliative care for goals of care--meeting with mother --she wants all care for now. Pt is FULL code  CSW working on placement--Difficult placement since pt is NWB due to fracture and severe psychosis.  CODE STATUS: Full code   TOTAL TIME TAKING CARE OF THIS PATIENT: 25 minutes.   POSSIBLE D/C IN ? DAYS, DEPENDING ON CLINICAL CONDITION.  Enedina Finner M.D on 07/03/2019 at 7:39 AM  Between 7am to 6pm - Pager - [phone] After 6pm go to [email or web address] - password [employer]  [facility]  Office  [phone]  CC: Primary care physician; Patient, [name]

## 2019-07-04 MED ORDER — SODIUM CHLORIDE 0.9 % IV SOLN
150.0000 mg | Freq: Every evening | INTRAVENOUS | Status: DC | PRN
  Filled 2019-07-04: qty 6

## 2019-07-04 MED ORDER — DIVALPROEX SODIUM 500 MG PO DR TAB
500.0000 mg | DELAYED_RELEASE_TABLET | Freq: Two times a day (BID) | ORAL | Status: DC
  Administered 2019-07-04 – 2019-07-09 (×10): 500 mg via ORAL
  Filled 2019-07-04 (×11): qty 1

## 2019-07-04 MED ORDER — OLANZAPINE 10 MG PO TBDP
10.0000 mg | ORAL_TABLET | Freq: Two times a day (BID) | ORAL | Status: DC
  Administered 2019-07-04 – 2019-07-06 (×5): 10 mg via ORAL
  Filled 2019-07-04 (×6): qty 1

## 2019-07-04 NOTE — Progress Notes (Signed)
Nutrition Follow-up  DOCUMENTATION CODES:   Not applicable  INTERVENTION:  Continue Nepro Shake po BID, each supplement provides 425 kcal and 19 grams protein.  Will change liquid MVI to regular MVI.  Continue folic acid 1 mg daily and thiamine 100 mg daily.  NUTRITION DIAGNOSIS:   Inadequate oral intake related to inability to eat as evidenced by NPO status.  Resolving - diet has been advanced.  GOAL:   Patient will meet greater than or equal to 90% of their needs  Progressing.  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 Pt now s/p vascular repair 9/24 and ORIF 9/25. Pt devloped withdrawl symptoms and agitation on 9/27 requiring intubation  Attempted to call patient over the phone but he was unable to answer. Several days ago patient was eating 100% of his meals (10/16-10/17). PO intake then began to decrease and on the 19th he ate 0% to bites. No documentation since 10/19. Today diet was liberalized to regular with thin liquids. Noted on MAR that patient is being given Glucerna instead of Nepro. Discussed that patient can continue to drink Nepro. Diet was upgraded today to regular with thin liquids.  Medications reviewed and include: famotidine, folic acid 1 mg daily, liquid MVI daily per tube, nicotine patch, senna 1 tablet daily, thiamine 100 mg daily.  Labs reviewed: CBG 77-102 on 10/18 and hasn't been checked since.   Diet Order:   Diet Order            Diet regular Room service appropriate? Yes with Assist; Fluid consistency: Thin  Diet effective now             EDUCATION  NEEDS:   No education needs have been identified at this time  Skin:  Skin Assessment: Skin Integrity Issues:(closed incision right ankle; ecchymosis)  Last BM:  07/04/2019 - type 4  Height:   Ht Readings from Last 1 Encounters:  06/15/19 6' 0.01" (1.829 m)   Weight:   Wt Readings from Last 1 Encounters:  07/03/19 77.1 kg   Ideal Body Weight:  80.9 kg  BMI:  Body mass index is 23.05 kg/m.  Estimated Nutritional Needs:   Kcal:  2000-2200  Protein:  100-110 grams  Fluid:  2-2.2 L/day  Willey Blade, MS, RD, LDN Office: [phone] Pager: [phone] After Hours/Weekend Pager: [phone]

## 2019-07-04 NOTE — TOC Progression Note (Addendum)
Transition of Care [facility]) - Progression Note    Patient Details  Name: Thomas [name] MRN: [id number] Date of Birth: June 27, 1966  [facility]) CM/SW Contact  Deondray Ospina, Lenice Llamas Phone Number: [phone]  07/04/2019, 9:19 AM  Clinical Narrative: Patient has no SNF bed offers due to his agitation and aggressive behaviors. PASARR is still pending. Clinical Social Worker (CSW) will continue to follow and assist as needed.     Expected Discharge Plan: [place] Barriers to Discharge: Continued Medical Work up  Expected Discharge Plan and Services Expected Discharge Plan: [place] arrangements for the past 2 months: Single Family Home                                       Social Determinants of Health (SDOH) Interventions    Readmission Risk Interventions No flowsheet data found.

## 2019-07-04 NOTE — Consult Note (Signed)
  Seen on 07/04/2019:  Patient seen. Seems to be improving although still having some confusion/confabulation. Will increase PO antipsychotics and mood stabilizers to help control nighttime agitation.    Recommendations:  -Zyprexa 5mg  PO BID increased to 10mg  BID -Zyprexa 5mg  PO PRN TID agitation -Continue lorazepam 1 -2 mg 3 times daily as needed agitation.  -Continue Suboxone 8/2 mg 3 times daily - Depakote 250 mg IV 3 times daily. Changed to 500mg  PO BID    -Continue Geodon 10 mg IM every 6 hours as needed agitation if patient is unable to take PO  -Thorazine increased to 150mg  IV q. at bedtime if patient doesn't take his qhs zyprexa.   -Psych will continue to follow

## 2019-07-04 NOTE — Progress Notes (Signed)
  Speech Language Pathology Treatment: Dysphagia  Patient Details Name: Thomas [name] MRN: [id number] DOB: 10/05/1965 Today's Date: 07/04/2019 Time: 3235-5732 SLP Time Calculation (min) (ACUTE ONLY): 48 min  Assessment / Plan / Recommendation Clinical Impression  Pt seen for ongoing assessment of toleration of oral diet; trials to upgrade diet consistency as pt's mental status has improved somewhat. He remains confused; Sitter in place for safety. Pt is easily distracted and requires redirection during po tasks. He appeared impulsive.  Pt consumed trials of thin liquids and mech soft food w/ no immediate, clinical overt s/s of aspiration noted; no coughing or throat clearing, no decline in respiratory status, no decline in vocal quality when talking (immediately) following po trials. Oral phase appeared grossly wfl w/ bolus management and oral clearing of softened foods. Pt declined further food trials stating he "didn't want it". He seemed distracted about wanting to see PT to "help me get out of this bed and walk". Pt does have a cast on his R ankle s/p fx. Pt fed self w/ setup. Verbal cues given to attend to po tasks in front of him.  Recommend a Mech Soft diet w/ thin liquids; moistened foods cut small. Recommend aspiration precautions; Pills in Puree for safer swallowing. Pt would benefit from Supervision during meals d/t distracted behaviors; declined Cognitive status in general. ST services will be available if any decline in status while admitted. NSG updated, agreed.     HPI HPI: Pt is a 53 yo male with an extensive psychiatric history and polysubstance abuse presented to the ED following a fall at home with right ankle fracture and right lower extremity ischemia and inability to locate pulse with doppler. After admission to the ICU, pt began exhibiting sx of drug/alchol withdrawal syndrome requiring multiple sedatives and leading to respiratory failure requiring intubation 9/27.CXR  suspicious for right lower lobe pneumonia. seizures few days prior to this admissions due to alcohol withdrawal, they also report previous admission to [facility] for over 2 months due to refractory seizures with severe delirium tremens resulting in fall and cervical neck fracture status post neurosurgical intervention.  Pt failed SBT several attempts but finally extubated on 06/22/2019; currently on RA. Pt continues to have behavioral issues and requires a Sitter, medications.       SLP Plan  Continue with current plan of care(f/u w/ toleration of diet as needed)       Recommendations  Diet recommendations: Regular;Thin liquid(cut meats, gravy) Liquids provided via: Cup;Straw(monitor) Medication Administration: Whole meds with puree(for safer swallowing) Supervision: Patient able to self feed;Staff to assist with self feeding;Intermittent supervision to cue for compensatory strategies Compensations: Minimize environmental distractions;Slow rate;Small sips/bites;Lingual sweep for clearance of pocketing;Multiple dry swallows after each bite/sip;Follow solids with liquid Postural Changes and/or Swallow Maneuvers: Seated upright 90 degrees;Upright 30-60 min after meal                General recommendations: (Dietician following) Oral Care Recommendations: Oral care BID;Staff/trained caregiver to provide oral care Follow up Recommendations: Skilled Nursing facility(TBD) SLP Visit Diagnosis: Dysphagia, unspecified (R13.10)(mental status improved for po intake) Plan: Continue with current plan of care(f/u w/ toleration of diet as needed)       GO                 [name], MS, CCC-SLP , 07/04/2019, 5:05 PM

## 2019-07-04 NOTE — Progress Notes (Addendum)
Pt refuses night time meds. Meds crushed in applesauce and offered to patient. Continues to exhibit signs of paranoia screaming "you are trying to kill me!". Pt became extremely agitated and combative, constantly trying to get out of bed and verbally aggressive when advised against such. Refused for PICC line to be flushed. Thorazine was administered, with little effect, pt continues to try to get out of bed. Eventually calmed down at approximately 0300.

## 2019-07-04 NOTE — Progress Notes (Signed)
Spoke with dietician via phone. Will offer only Nephro nutritional drinks as opposed to Glucerna as Nephro is carb steady/low glycemic index.

## 2019-07-04 NOTE — Progress Notes (Signed)
Pt refuses to keep tele monitor on, CCMD twice about pt not being on monitor. Refusal documented in flowsheets, CCMD and MD Marcille Blanco notified about pt's refusal.

## 2019-07-04 NOTE — Progress Notes (Signed)
Central line caps changed and CHG bath given

## 2019-07-04 NOTE — Progress Notes (Addendum)
Daily Progress Note   Patient Name: Thomas [name]       Date: 07/04/2019 DOB: 1966/03/16  Age: 53 y.o. MRN#: [id number] Attending Physician: Fritzi Mandes, MD Primary Care Physician: Patient, [name] Admit Date: 06/07/2019  Reason for Consultation/Follow-up: Establishing goals of care  Subjective: Patient is sitting in bed with sitter at bedside. He is agitated and restless, and complains of itching under his cast. He is confused in conversation.  He has only eaten bites this morning and drank a cup and a half of thickened liquid. Spoke with patient's mother [name] yesterday, per conversation continuing full code/full scope. Spoke with adult daughter Yetta Flock this morning. She confirms that her grandmother is keeping her well updated, and that her grandmother could make the decisions on his healthcare. She has no questions at this time.   Length of Stay: 27  Current Medications: Scheduled Meds:  . aspirin  81 mg Oral Daily  . baclofen  10 mg Per Tube TID  . buprenorphine-naloxone  1 tablet Sublingual TID  . chlorhexidine gluconate (MEDLINE KIT)  15 mL Mouth Rinse BID  . Chlorhexidine Gluconate Cloth  6 each Topical Daily  . clobetasol cream   Topical BID  . clopidogrel  75 mg Oral Daily  . cyproheptadine  4 mg Per Tube TID  . enoxaparin (LOVENOX) injection  40 mg Subcutaneous Q24H  . famotidine  20 mg Per Tube BID  . feeding supplement (NEPRO CARB STEADY)  237 mL Oral BID BM  . folic acid  1 mg Oral Daily  . influenza vac split quadrivalent PF  0.5 mL Intramuscular Tomorrow-1000  . mouth rinse  15 mL Mouth Rinse BID  . midazolam  1 mg Intramuscular Once  . multivitamin  15 mL Per Tube Daily  . nicotine  14 mg Transdermal Daily  . OLANZapine zydis  5 mg Oral BID  . senna  1 tablet  Oral Daily  . sodium chloride flush  10-40 mL Intracatheter Q12H  . thiamine injection  100 mg Intravenous Daily    Continuous Infusions: . sodium chloride Stopped (06/29/19 1655)  . chlorproMAZINE (THORAZINE) IV 75 mg (07/03/19 2140)  . valproate sodium 250 mg (07/03/19 2300)    PRN Meds: sodium chloride, acetaminophen, chlorproMAZINE (THORAZINE) IV, LORazepam, LORazepam, OLANZapine zydis, sodium chloride flush, sodium chloride flush,  ziprasidone  Physical Exam Pulmonary:     Effort: Pulmonary effort is normal.  Neurological:     Mental Status: He is alert.             Vital Signs: BP 124/77 (BP Location: Left Arm)   Pulse 92   Temp (!) 97.5 F (36.4 C) (Oral)   Resp 16   Ht 6' 0.01" (1.829 m)   Wt 77.1 kg   SpO2 99%   BMI 23.05 kg/m  SpO2: SpO2: 99 % O2 Device: O2 Device: Room Air O2 Flow Rate: O2 Flow Rate (L/min): 2 L/min  Intake/output summary:   Intake/Output Summary (Last 24 hours) at 07/04/2019 1033 Last data filed at 07/04/2019 0700 Gross per 24 hour  Intake 113.1 ml  Output 100 ml  Net 13.1 ml   LBM: Last BM Date: 06/30/19 Baseline Weight: Weight: 76.6 kg Most recent weight: Weight: 77.1 kg       Palliative Assessment/Data:    Flowsheet Rows     Most Recent Value  Intake Tab  Referral Department  Hospitalist  Unit at Time of Referral  Med/Surg Unit  Palliative Care Primary Diagnosis  Neurology  Date Notified  06/28/19  Palliative Care Type  New Palliative care  Reason for referral  Clarify Goals of Care  Date of Admission  06/07/19  Date first seen by Palliative Care  06/28/19  # of days Palliative referral response time  0 Day(s)  # of days IP prior to Palliative referral  21  Clinical Assessment  Psychosocial & Spiritual Assessment  Palliative Care Outcomes      Patient Active Problem List   Diagnosis Date Noted  . Acute respiratory failure ([place])   . Schizoaffective disorder, bipolar type ([place]) 06/09/2019  . Ischemia of  extremity 06/07/2019    Palliative Care Assessment & Plan    Recommendations/Plan:  Continue full code/full scope.   Could try Atarax or Benadryl for itching if cleared by pharmacy to be compatible with current psych meds.     Code Status:    Code Status Orders  (From admission, onward)         Start     Ordered   06/07/19 2254  Full code  Continuous     06/07/19 2253        Code Status History    This patient has a current code status but no historical code status.   Advance Care Planning Activity       Prognosis:   Unable to determine  Discharge Planning:  To Be Determined  Care plan was discussed with CM  Thank you for allowing the Palliative Medicine Team to assist in the care of this patient.   Total Time 15 min Prolonged Time Billed  no      Greater than 50%  of this time was spent counseling and coordinating care related to the above assessment and plan.  Asencion Gowda, NP  Please contact Palliative Medicine Team phone at [phone] for questions and concerns.

## 2019-07-04 NOTE — Progress Notes (Signed)
[employer] - [place] [facility]   PATIENT NAME: Thomas [name]    MR#:  [id number]  DATE OF BIRTH:  [birth year]  SUBJECTIVE:   Pt must much calm and composed. Wants to get physical therapy done. He wants to work with the team in trying to get out of the hospital. REVIEW OF SYSTEMS:  Review of Systems  Unable to perform ROS: Mental status change   DRUG ALLERGIES:   Allergies  Allergen Reactions  . Haldol [Haloperidol Lactate]    VITALS:  Blood pressure 124/77, pulse 92, temperature (!) 97.5 F (36.4 C), temperature source Oral, resp. rate 16, height 6' 0.01" (1.829 m), weight 77.1 kg, SpO2 99 %.  PHYSICAL EXAMINATION:  Physical Exam Limited exam due to pt cooperation GENERAL:  53 y.o.-year-old patient lying in the bed.sleeping LUNGS: Normal breath sounds bilaterally, no wheezing, rales,rhonchi or crepitation. No use of accessory muscles of respiration.  Decreased bibasilar breath sounds CARDIOVASCULAR: S1, S2 normal. No murmurs, rubs, or gallops.  ABDOMEN: Soft, nontender, nondistended. Bowel sounds present. No organomegaly or mass.  EXTREMITIES: No  cyanosis, or clubbing.   Right leg and ankle in a cast.    NEUROLOGIC: sedated   PSYCHIATRIC: confused  Unable to assess.  LABORATORY PANEL:   CBC No results for input(s): WBC, HGB, HCT, PLT in the last 168 hours. ------------------------------------------------------------------------------------------------------------------  Chemistries  Recent Labs  Lab 06/29/19 0532 06/30/19 0405  NA 138 139  K 4.5 4.5  CL 101 102  CO2 28 25  GLUCOSE 99 89  BUN 14 18  CREATININE 0.64 0.71  CALCIUM 8.8* 9.2  MG 1.9  --    ------------------------------------------------------------------------------------------------------------------  Cardiac Enzymes No results for input(s): TROPONINI in the last 168 hours.  ------------------------------------------------------------------------------------------------------------------  RADIOLOGY:  No results found.  EKG:   Orders placed or performed during the hospital encounter of 06/07/19  . EKG 12-Lead  . EKG 12-Lead  . EKG 12-Lead  . EKG 12-Lead  . EKG 12-Lead  . EKG 12-Lead    ASSESSMENT AND PLAN:   JamesRhodesis a53 y.o.malewith a known history of polysubstance abuse including alcohol,schizoaffective bipolar type,seizures,hypertension and insomnia who initially presented on 06/07/2019 following an episode of a fall. Was evaluated in the emergency room and found to have right ankle fracture as well as right foot ischemia.Went into alcohol withdrawal  *Acute Delirium in the setting of severe schizoaffective/bipolar issue  -CT head normal-normal -TSH, Ammonia, B12. Folate-normal  *History of schizoaffective bipolar type -Being followed by psychiatry service and managing psych meds.   -d/w dr Cindi Carbon on oct 19th--OK to cont geodon q 6 hourly prn (since that is the only med keeps him calm) -IV depacon, Thorazine prn, zyprexa, ativan, versed prn  *Acute hypoxic respiratory failure secondary to pneumonia.  Possible aspiration. - Intubated for hypoxic respiratory failure and airway protection on 06/10/2019, extubated on 06/20/2019. -Finished IV Unasyn, IV Bactrim -Respiratory cultures with Klebsiella/stenotrophomonas.  *Right lower extremity ischemia -status post revascularization with angioplasty and stent placement by vascular surgery in right superficial femoral artery, right external iliac artery. -Continue aspirin and Plavix  *Right ankle fracture. Status post fall -Patient status post open reduction with internal fixation of right ankle by orthopedic physician--staples removed and cast placed.  -Physical therapy re-ordered -On Suboxone as well  *Alcohol abuse with delirium tremens and now acute metabolic  encephalopathy and  inpatient delirium -Continue thiamine,multivitamin and folic acid.  *DVT prophylaxis;Lovenox   Consulted palliative care for goals of care--meeting with mother --she wants all  care for now. Pt is FULL code  [facility] working on placement--Difficult placement since pt is NWB due to fracture and severe psychosis.  CODE STATUS: Full code   TOTAL TIME TAKING CARE OF THIS PATIENT: 25 minutes.   POSSIBLE D/C IN ? DAYS, DEPENDING ON CLINICAL CONDITION.  Fritzi Mandes M.D on 07/04/2019 at 3:31 PM  Between 7am to 6pm - Pager - [phone] After 6pm go to [email or web address] - password [email or web address] [place] [facility]  Office  [phone]  CC: Primary care physician; Patient, [name]

## 2019-07-04 NOTE — Progress Notes (Signed)
Physical Therapy Treatment Patient Details Name: Thomas [name] MRN: [id number] DOB: July 06, 1966 Today's Date: 07/04/2019    History of Present Illness 53 y/o male with a tortuous hospitalization thus far.  He had a fall with R ankle fracture 9/24, underwent ORIF 9/25.  Was aggressive with staff and needed intubated 9/27, finally extubated 10/7.  He has been having issues with AMS/delerium and has not been appropriate for PT.  Pt has been agitated for seevral days requiring medical sedation 2/2 agression.    PT Comments    Oriented patient to situation, weight bearing status. Pt is restless, impulsive, requires constant redirection to attend to session and all activities within session. Pt has generally good strength and he is heavy motivated to increase his physical activity while admitted, as well as maintain his functional independence. Pt is shows no signs of being able to maintain NWB on RLE, hence not safe to progress AMB. In standing, several LOB noted toward Right, pt minimally aware, and poor insight into safety. Pt progressing well in general but remains largely unsafe.      Follow Up Recommendations  SNF;Supervision for mobility/OOB;Supervision/Assistance - 24 hour     Equipment Recommendations  Other (comment)(would need a WC, likely unable to attend to precautions.)    Recommendations for Other Services       Precautions / Restrictions Precautions Precautions: Fall Precaution Comments: *impulsive Restrictions RLE Weight Bearing: Non weight bearing    Mobility  Bed Mobility Overal bed mobility: Needs Assistance Bed Mobility: Supine to Sit;Sit to Supine     Supine to sit: Supervision Sit to supine: Supervision   General bed mobility comments: supervision for safety 2/2 impulsivity. Pt requires no physical assistance  Transfers Overall transfer level: Needs assistance Equipment used: Rolling walker (2 wheeled) Transfers: Sit to/from Stand Sit to Stand: Min  guard;Supervision         General transfer comment: STS from EOB, not following cues for RLE NWB, multiple LOB to right requiring modA for recovery from author, is able to balance with SLS a few times wiht BUEW support on RW, but generally unsteady adn impulsive.  Ambulation/Gait Ambulation/Gait assistance: (performed some sidestepping at EOB, cues for NWB, pt does not follow appears to attempt RLE use for steppign strategy.)               Stairs             Wheelchair Mobility    Modified Rankin (Stroke Patients Only)       Balance Overall balance assessment: Needs assistance Sitting-balance support: Single extremity supported Sitting balance-Leahy Scale: Good     Standing balance support: Bilateral upper extremity supported;During functional activity Standing balance-Leahy Scale: Poor                              Cognition Arousal/Alertness: Awake/alert Behavior During Therapy: Impulsive;WFL for tasks assessed/performed;Restless Overall Cognitive Status: No family/caregiver present to determine baseline cognitive functioning                                 General Comments: Perseverating on his PMH, off topic, requires redirection; struggles to follow cues as he is significantly distracted.      Exercises Other Exercises Other Exercises: standing RLE hip flexion 1x15; seated RLE marching 1x10 Other Exercises: seated RLE LAQ 1x10 Other Exercises: supine RLE SLR 1x10 Other Exercises: supine LLE single  leg bridge 1x10    General Comments        Pertinent Vitals/Pain Pain Assessment: No/denies pain    Home Living                      Prior Function            PT Goals (current goals can now be found in the care plan section) Acute Rehab PT Goals Patient Stated Goal: Wants to maintain strength and physical activity. PT Goal Formulation: Patient unable to participate in goal setting Time For Goal Achievement:  07/13/19 Potential to Achieve Goals: Good Progress towards PT goals: Progressing toward goals    Frequency    7X/week      PT Plan Current plan remains appropriate    Co-evaluation              AM-PAC PT "6 Clicks" Mobility   Outcome Measure  Help needed turning from your back to your side while in a flat bed without using bedrails?: A Little Help needed moving from lying on your back to sitting on the side of a flat bed without using bedrails?: A Little Help needed moving to and from a bed to a chair (including a wheelchair)?: A Little Help needed standing up from a chair using your arms (e.g., wheelchair or bedside chair)?: A Little Help needed to walk in hospital room?: A Lot Help needed climbing 3-5 steps with a railing? : A Lot 6 Click Score: 16    End of Session   Activity Tolerance: Patient tolerated treatment well;No increased pain Patient left: in bed;with call bell/phone within reach;with nursing/sitter in room Nurse Communication: Mobility status PT Visit Diagnosis: Muscle weakness (generalized) (M62.81);Difficulty in walking, not elsewhere classified (R26.2);Unsteadiness on feet (R26.81)     Time: 9379-0240 PT Time Calculation (min) (ACUTE ONLY): 21 min  Charges:  $Therapeutic Exercise: 8-22 mins                     4:07 PM, 07/04/19 Thomas [name], PT, DPT Physical Therapist - [facility]  [phone] ([place])     Thomas [name] 07/04/2019, 4:03 PM

## 2019-07-05 LAB — CREATININE, SERUM
Creatinine, Ser: 0.77 mg/dL (ref 0.61–1.24)
GFR calc Af Amer: >60 mL/min (ref >60)
GFR calc non Af Amer: >60 mL/min (ref >60)

## 2019-07-05 LAB — GLUCOSE, CAPILLARY: Glucose-Capillary: 126 mg/dL — ABNORMAL HIGH (ref 70–99)

## 2019-07-05 MED ORDER — VITAMIN B-1 100 MG PO TABS
100.0000 mg | ORAL_TABLET | Freq: Every day | ORAL | Status: DC
  Administered 2019-07-06 – 2019-07-09 (×4): 100 mg via ORAL
  Filled 2019-07-05 (×4): qty 1

## 2019-07-05 NOTE — Progress Notes (Signed)
[employer] - [place] at [facility]   PATIENT NAME: Thomas [name]    MR#:  [id number]  DATE OF BIRTH:  06/15/1966  SUBJECTIVE:   Was agitated in AM and received Geodon. Sleepy after that but thru the day woke up.  REVIEW OF SYSTEMS:  Review of Systems  Unable to perform ROS: Mental status change   DRUG ALLERGIES:   Allergies  Allergen Reactions  . Haldol [Haloperidol Lactate]    VITALS:  Blood pressure 120/75, pulse 97, temperature 98.1 F (36.7 C), temperature source Oral, resp. rate 18, height 6' 0.01" (1.829 m), weight 66.2 kg, SpO2 99 %.  PHYSICAL EXAMINATION:  Physical Exam Limited exam due to pt cooperation GENERAL:  53 y.o.-year-old patient lying in the bed.sleeping LUNGS: Normal breath sounds bilaterally, no wheezing, rales,rhonchi or crepitation. No use of accessory muscles of respiration.  Decreased bibasilar breath sounds CARDIOVASCULAR: S1, S2 normal. No murmurs, rubs, or gallops.  ABDOMEN: Soft, nontender, nondistended. Bowel sounds present. No organomegaly or mass.  EXTREMITIES: No  cyanosis, or clubbing.   Right leg and ankle in a cast.    NEUROLOGIC: sedated   PSYCHIATRIC: confused  Unable to assess.  LABORATORY PANEL:   CBC No results for input(s): WBC, HGB, HCT, PLT in the last 168 hours. ------------------------------------------------------------------------------------------------------------------  Chemistries  Recent Labs  Lab 06/29/19 0532 06/30/19 0405 07/05/19 0532  NA 138 139  --   K 4.5 4.5  --   CL 101 102  --   CO2 28 25  --   GLUCOSE 99 89  --   BUN 14 18  --   CREATININE 0.64 0.71 0.77  CALCIUM 8.8* 9.2  --   MG 1.9  --   --    ------------------------------------------------------------------------------------------------------------------  Cardiac Enzymes No results for input(s): TROPONINI in the last 168 hours.  ------------------------------------------------------------------------------------------------------------------  RADIOLOGY:  No results found.  EKG:   Orders placed or performed during the hospital encounter of 06/07/19  . EKG 12-Lead  . EKG 12-Lead  . EKG 12-Lead  . EKG 12-Lead  . EKG 12-Lead  . EKG 12-Lead    ASSESSMENT AND PLAN:   JamesRhodesis a53 y.o.malewith a known history of polysubstance abuse including alcohol,schizoaffective bipolar type,seizures,hypertension and insomnia who initially presented on 06/07/2019 following an episode of a fall. Was evaluated in the emergency room and found to have right ankle fracture as well as right foot ischemia.Went into alcohol withdrawal  *Acute Delirium in the setting of severe schizoaffective/bipolar issue  -CT head normal-normal -TSH, Ammonia, B12. Folate-normal  *History of schizoaffective bipolar type -Being followed by psychiatry service and managing psych meds.   -d/w dr Cindi Carbon on oct 19th--OK to cont geodon q 6 hourly prn  -IV depacon, Thorazine prn, zyprexa, ativan, versed prn  Still agitated at times needing IV meds to keep calm. Needs inpatient stay  *Acute hypoxic respiratory failure secondary to pneumonia.  Possible aspiration. - Intubated for hypoxic respiratory failure and airway protection on 06/10/2019, extubated on 06/20/2019. -Finished IV Unasyn, IV Bactrim -Respiratory cultures with Klebsiella/stenotrophomonas.  *Right lower extremity ischemia -status post revascularization with angioplasty and stent placement by vascular surgery in right superficial femoral artery, right external iliac artery. -Continue aspirin and Plavix  *Right ankle fracture. Status post fall -Patient status post open reduction with internal fixation of right ankle by orthopedic physician--staples removed and cast placed.  -Physical therapy re-ordered -On Suboxone as well  *Alcohol abuse with delirium tremens  and now acute metabolic encephalopathy and  inpatient delirium -  Continue thiamine,multivitamin and folic acid.  *DVT prophylaxis;Lovenox   Consulted palliative care for goals of care--meeting with mother --she wants all care for now. Pt is FULL code  [place] working on placement--Difficult placement since pt is NWB due to fracture and severe psychosis.  CODE STATUS: Full code   TOTAL TIME TAKING CARE OF THIS PATIENT: 30 minutes.   POSSIBLE D/C IN 2-3 DAYS, DEPENDING ON CLINICAL CONDITION.  Leia Alf Tammi Boulier M.D on 07/05/2019 at 4:08 PM  Between 7am to 6pm - Pager - [phone]  After 6pm go to [email or web address] - password [email or web address] [place] [facility]  Office  [phone]  CC: Primary care physician; Patient, [name]

## 2019-07-05 NOTE — TOC Progression Note (Signed)
Transition of Care [facility]) - Progression Note    Patient Details  Name: Thomas [name] MRN: [id number] Date of Birth: 01-07-1966  [facility] CM/SW Contact  Dylen Mcelhannon, Lenice Llamas Phone Number: [phone] 07/05/2019, 4:06 PM  Clinical Narrative: Clinical Social Worker ([place]) discussed case with [facility]. CSW extended SNF search across [place]. No SNF bed offers at this point. [facility] has been received, [phone] C. CSW will continue to follow and assist as needed.          Expected Discharge Plan: [place] Barriers to Discharge: Continued Medical Work up  Expected Discharge Plan and Services Expected Discharge Plan: [place] arrangements for the past 2 months: Single Family Home                                       Social Determinants of Health (SDOH) Interventions    Readmission Risk Interventions No flowsheet data found.

## 2019-07-05 NOTE — Plan of Care (Signed)
  Problem: Clinical Measurements: Goal: Ability to maintain clinical measurements within normal limits will improve Outcome: Progressing Goal: Will remain free from infection Outcome: Progressing Goal: Diagnostic test results will improve Outcome: Progressing   Problem: Elimination: Goal: Will not experience complications related to bowel motility Outcome: Progressing Goal: Will not experience complications related to urinary retention Outcome: Progressing   Problem: Education: Goal: Knowledge of General Education information will improve Description: Including pain rating scale, medication(s)/side effects and non-pharmacologic comfort measures Outcome: Not Progressing   Problem: Health Behavior/Discharge Planning: Goal: Ability to manage health-related needs will improve Outcome: Not Progressing

## 2019-07-05 NOTE — Progress Notes (Signed)
Physical Therapy Treatment Patient Details Name: Thomas [name] MRN: [id number] DOB: 1966-09-01 Today's Date: 07/05/2019    History of Present Illness 53 y/o male with a tortuous hospitalization thus far.  He had a fall with R ankle fracture 9/24, underwent ORIF 9/25.  Was aggressive with staff and needed intubated 9/27, finally extubated 10/7.  He has been having issues with AMS/delerium and has not been appropriate for PT.  Pt has been agitated for seevral days requiring medical sedation 2/2 agression.    PT Comments    Sitter in room.  OK from RN to work with pt but recently received medication.  Pt somewhat lethargic upon entering room but awoke with verbal cues.  Participated in ex as below but had a difficult time staying on task and resistant to participate in LLE ex.  Seemed generally comfortable with RLE ex.  Pt with increasing agitation with ex and being upset he was not being taken to [facility] and people keeping him up yelling outside his window last night.  Encouragement given.  Mobility deferred as pt unable to maintain NWB and did not seem like he was in a place for teaching to be effective.   Follow Up Recommendations  SNF;Supervision for mobility/OOB;Supervision/Assistance - 24 hour     Equipment Recommendations       Recommendations for Other Services       Precautions / Restrictions Precautions Precautions: Fall Precaution Comments: *impulsive Restrictions RLE Weight Bearing: Non weight bearing    Mobility  Bed Mobility               General bed mobility comments: deferred due to lethargy initially then increasing agitation affecting safety and ability to follow instruions.  Transfers                    Ambulation/Gait                 Stairs             Wheelchair Mobility    Modified Rankin (Stroke Patients Only)       Balance                                            Cognition Arousal/Alertness:  Awake/alert Behavior During Therapy: Impulsive;WFL for tasks assessed/performed;Restless Overall Cognitive Status: No family/caregiver present to determine baseline cognitive functioning                                 General Comments: Perseverating on his PMH, off topic, requires redirection; struggles to follow cues as he is significantly distracted.      Exercises Other Exercises Other Exercises: supine BLE SLR, heel slides, ab/add and SAQ x 10 attempted on BLE but pt resistive to LLE ex stating "See, it's fine" after a few reps.    General Comments        Pertinent Vitals/Pain Pain Assessment: Faces Faces Pain Scale: Hurts a little bit Pain Location: R ankle Pain Descriptors / Indicators: Aching Pain Intervention(s): Monitored during session    Home Living                      Prior Function            PT Goals (current goals can now be found in  the care plan section) Progress towards PT goals: Progressing toward goals    Frequency    7X/week      PT Plan Current plan remains appropriate    Co-evaluation              AM-PAC PT "6 Clicks" Mobility   Outcome Measure  Help needed turning from your back to your side while in a flat bed without using bedrails?: A Little Help needed moving from lying on your back to sitting on the side of a flat bed without using bedrails?: A Little Help needed moving to and from a bed to a chair (including a wheelchair)?: A Little Help needed standing up from a chair using your arms (e.g., wheelchair or bedside chair)?: A Little Help needed to walk in hospital room?: A Lot Help needed climbing 3-5 steps with a railing? : Total 6 Click Score: 15    End of Session Equipment Utilized During Treatment: Gait belt Activity Tolerance: Patient tolerated treatment well;Treatment limited secondary to agitation Patient left: in bed;with call bell/phone within reach;with nursing/sitter in room   Pain -  Right/Left: Right Pain - part of body: Ankle and joints of foot     Time: 0922-0933 PT Time Calculation (min) (ACUTE ONLY): 11 min  Charges:  $Therapeutic Exercise: 8-22 mins                    Danielle Dess, PTA 07/05/19, 9:59 AM

## 2019-07-06 LAB — CREATININE, SERUM
Creatinine, Ser: 0.76 mg/dL (ref 0.61–1.24)
GFR calc Af Amer: >60 mL/min (ref >60)
GFR calc non Af Amer: >60 mL/min (ref >60)

## 2019-07-06 MED ORDER — ALTEPLASE 2 MG IJ SOLR
2.0000 mg | Freq: Once | INTRAMUSCULAR | Status: DC
  Filled 2019-07-06: qty 2

## 2019-07-06 MED ORDER — CHLORHEXIDINE GLUCONATE CLOTH 2 % EX PADS
6.0000 | MEDICATED_PAD | Freq: Every day | CUTANEOUS | Status: DC
  Administered 2019-07-07 – 2019-07-08 (×2): 6 via TOPICAL

## 2019-07-06 MED ORDER — ACETAMINOPHEN 325 MG PO TABS
650.0000 mg | ORAL_TABLET | ORAL | Status: DC | PRN
  Administered 2019-07-06 – 2019-07-08 (×4): 650 mg via ORAL
  Filled 2019-07-06 (×4): qty 2

## 2019-07-06 NOTE — Progress Notes (Signed)
PT Cancellation Note  Patient Details Name: Thomas [name] MRN: [id number] DOB: November 30, 1965   Cancelled Treatment:    Reason Eval/Treat Not Completed: Other (comment) Attempted X2 this AM.  First time he was sitting in chair near bed, demanding to go to [facility] and getting meds. Second attempt he was sleeping and per nursing it was likely best not to wake him.  Will try back later as time allows.    Kreg Shropshire, DPT 07/06/2019, 1:06 PM

## 2019-07-06 NOTE — Progress Notes (Signed)
SLP Cancellation Note  Patient Details Name: Thomas [name] MRN: [id number] DOB: 1966-02-01   Cancelled treatment:       Reason Eval/Treat Not Completed: SLP screened, no needs identified, will sign off(chart reviewed; consulted NSG re: pt's status). NSG reported pt is eating his regular diet w/ thin liquids w/ no difficulties. Pt denies any problems swallowing and is tolerating swallowing pills as well. Pt continues to be restless w/ agitation and Cognitive decline; requires a Sitter at bedside for safety. Pt requires no [place] O2 support; lungs clear per MD notes. No increased temp or wbc per labs.  No further skilled ST services indicated as pt appears at his baseline. Pt agreed. NSG to reconsult if any change in status while admitted.      [name], MS, CCC-SLP Watson,Katherine 07/06/2019, 12:55 PM

## 2019-07-06 NOTE — Progress Notes (Signed)
Physical Therapy Treatment Patient Details Name: Thomas [name] MRN: [id number] DOB: 07-Feb-1966 Today's Date: 07/06/2019    History of Present Illness 53 y/o male with a tortuous hospitalization thus far.  He had a fall with R ankle fracture 9/24, underwent ORIF 9/25.  Was aggressive with staff and needed intubated 9/27, finally extubated 10/7.  He has been having issues with AMS/delerium and has not been appropriate for PT.  Pt has been agitated for several days requiring medical sedation 2/2 agression.    PT Comments    Pt had, by far, his most successful mobility and ambulation attempts today but safety and awareness continue to be extremely fleeting and he needed, essentially, constant cuing and reinforcement to stay on task, be aware of walker use and WBing requirements and overall he showed poor ability to manage any aspect of mobility without direct intervention either direct, verbal or tactile.  He did not feel excessively fatigued with the effort, but did have HR elevate to the 120s with ambulation.  Pt is, clearly, not safe to be able to manage at home on his own and PT continues to recommend rehab with 24/7 supervision.   Follow Up Recommendations  Supervision/Assistance - 24 hour;SNF     Equipment Recommendations  (TBD at rehab)    Recommendations for Other Services       Precautions / Restrictions Precautions Precautions: Fall Restrictions Weight Bearing Restrictions: Yes RLE Weight Bearing: Non weight bearing    Mobility  Bed Mobility Overal bed mobility: Needs Assistance Bed Mobility: Supine to Sit     Supine to sit: Supervision     General bed mobility comments: Pt was able to get to EOB relatively easily, though he continuously needed reinforcement to stay on task and stay safe  Transfers Overall transfer level: Needs assistance Equipment used: Rolling walker (2 wheeled) Transfers: Sit to/from Stand Sit to Stand: Min guard         General transfer  comment: excessive and repeated explanation for where to put hands, to insure NWBing, encouragement to focus on task at hand.  However he did end up rising to standing w/o direct physical assist and kept R foot off the floor  Ambulation/Gait Ambulation/Gait assistance: Min assist Gait Distance (Feet): 65 Feet Assistive device: Rolling walker (2 wheeled)       General Gait Details: Pt did, by far, the most he has been able to do in a month.  With nearly constant reinforcement he was able to keep R foot off the floor a vast majority of the time.  He occasionlly would let foot/cast down and when PT reminded he always stated that he didn't put any actual weight through it.  Pt with very small, labored hoping steps.  His HR generally stayed in the 120s with activity going ~50 ft the first attempt and after seated rest break did another 65 ft.  Pt again needing constant cuing to remain on task and to insure safety in the hall but he surely surprised this PT with ability to even get to the hallway, much less how much he was actually able to do (while generally keeping weight of the R LE "well")   Stairs             Wheelchair Mobility    Modified Rankin (Stroke Patients Only)       Balance Overall balance assessment: Needs assistance   Sitting balance-Leahy Scale: Good     Standing balance support: Bilateral upper extremity supported;During functional activity  Standing balance-Leahy Scale: Fair Standing balance comment: Pt with improved awareness about keeping weight off the R but still needing nearly constant reminders to do so.                            Cognition Arousal/Alertness: Awake/alert Behavior During Therapy: Impulsive;Restless Overall Cognitive Status: (Pt unable to stay on task w/o excessive reorientation)                                        Exercises      General Comments        Pertinent Vitals/Pain Pain Assessment: (c/o  chronic pain; R shoulder, minimal ankle pain)    Home Living                      Prior Function            PT Goals (current goals can now be found in the care plan section) Progress towards PT goals: Progressing toward goals    Frequency    7X/week      PT Plan Current plan remains appropriate    Co-evaluation              AM-PAC PT "6 Clicks" Mobility   Outcome Measure  Help needed turning from your back to your side while in a flat bed without using bedrails?: A Little Help needed moving from lying on your back to sitting on the side of a flat bed without using bedrails?: A Little Help needed moving to and from a bed to a chair (including a wheelchair)?: A Little Help needed standing up from a chair using your arms (e.g., wheelchair or bedside chair)?: A Little Help needed to walk in hospital room?: A Lot Help needed climbing 3-5 steps with a railing? : Total 6 Click Score: 15    End of Session Equipment Utilized During Treatment: Gait belt Activity Tolerance: Patient tolerated treatment well;Treatment limited secondary to agitation Patient left: in bed;with call bell/phone within reach;with nursing/sitter in room Nurse Communication: Mobility status PT Visit Diagnosis: Muscle weakness (generalized) (M62.81);Difficulty in walking, not elsewhere classified (R26.2);Unsteadiness on feet (R26.81) Pain - Right/Left: Right Pain - part of body: Ankle and joints of foot     Time: 7867-6720 PT Time Calculation (min) (ACUTE ONLY): 42 min  Charges:  $Gait Training: 23-37 mins $Therapeutic Activity: 8-22 mins                     Kreg Shropshire, DPT 07/06/2019, 4:19 PM

## 2019-07-06 NOTE — Progress Notes (Signed)
[employer] - [place] at [facility]   PATIENT NAME: Thomas [name]    MR#:  [id number]  DATE OF BIRTH:  26-Oct-1965  SUBJECTIVE:   Patient sitting in chair.  Very restless.  Wants to leave from the hospital.  Sitter at bedside.  REVIEW OF SYSTEMS:  Review of Systems  Unable to perform ROS: Mental status change   DRUG ALLERGIES:   Allergies  Allergen Reactions  . Haldol [Haloperidol Lactate]    VITALS:  Blood pressure (!) 125/95, pulse 97, temperature 98.2 F (36.8 C), temperature source Oral, resp. rate 16, height 6' 0.01" (1.829 m), weight 66.2 kg, SpO2 100 %.  PHYSICAL EXAMINATION:  Physical Exam Limited exam due to pt cooperation GENERAL:  53 y.o.-year-old patient comfortable but restless LUNGS: Normal breath sounds bilaterally, no wheezing, rales,rhonchi or crepitation. No use of accessory muscles of respiration.  Decreased bibasilar breath sounds CARDIOVASCULAR: S1, S2 normal. No murmurs, rubs, or gallops.  ABDOMEN: Soft, nontender, nondistended. Bowel sounds present. No organomegaly or mass.  EXTREMITIES: No  cyanosis, or clubbing.   Right leg and ankle in a cast.    NEUROLOGIC: Strength symmetrical on left and right PSYCHIATRIC: confused    LABORATORY PANEL:   CBC No results for input(s): WBC, HGB, HCT, PLT in the last 168 hours. ------------------------------------------------------------------------------------------------------------------  Chemistries  Recent Labs  Lab 06/30/19 0405  07/06/19 0519  NA 139  --   --   K 4.5  --   --   CL 102  --   --   CO2 25  --   --   GLUCOSE 89  --   --   BUN 18  --   --   CREATININE 0.71   < > 0.76  CALCIUM 9.2  --   --    < > = values in this interval not displayed.   ------------------------------------------------------------------------------------------------------------------  Cardiac Enzymes No results for input(s): TROPONINI in the last 168 hours.  ------------------------------------------------------------------------------------------------------------------  RADIOLOGY:  No results found.  EKG:   Orders placed or performed during the hospital encounter of 06/07/19  . EKG 12-Lead  . EKG 12-Lead  . EKG 12-Lead  . EKG 12-Lead  . EKG 12-Lead  . EKG 12-Lead    ASSESSMENT AND PLAN:   JamesRhodesis a53 y.o.malewith a known history of polysubstance abuse including alcohol,schizoaffective bipolar type,seizures,hypertension and insomnia who initially presented on 06/07/2019 following an episode of a fall. Was evaluated in the emergency room and found to have right ankle fracture as well as right foot ischemia.Went into alcohol withdrawal  *Acute Delirium in the setting of severe schizoaffective/bipolar issue  -CT head normal-normal -TSH, Ammonia, B12. Folate-normal Psychiatry titrating meds  *History of schizoaffective bipolar type -Being followed by psychiatry service and managing psych meds.   -d/w dr Cindi Carbon on oct 19th--OK to cont geodon q 6 hourly prn  -IV depacon, Thorazine prn, zyprexa, ativan, versed prn  Still agitated at times needing IV meds to keep calm. Needs inpatient stay  *Acute hypoxic respiratory failure secondary to pneumonia.  Possible aspiration. - Intubated for hypoxic respiratory failure and airway protection on 06/10/2019, extubated on 06/20/2019. -Finished IV Unasyn, IV Bactrim -Respiratory cultures with Klebsiella/stenotrophomonas.  *Right lower extremity ischemia -status post revascularization with angioplasty and stent placement by vascular surgery in right superficial femoral artery, right external iliac artery. -Continue aspirin and Plavix  *Right ankle fracture. Status post fall -Patient status post open reduction with internal fixation of right ankle by orthopedic physician--staples removed and cast placed.  -  Physical therapy re-ordered -On Suboxone as well  *Alcohol  abuse with delirium tremens and now acute metabolic encephalopathy and  inpatient delirium -Continue thiamine,multivitamin and folic acid.  *DVT prophylaxis;Lovenox  CSW working on placement--Difficult placement since pt is NWB due to fracture and severe psychosis.  CODE STATUS: Full code   TOTAL TIME TAKING CARE OF THIS PATIENT: 30 minutes.   POSSIBLE D/C IN 2-3 DAYS, DEPENDING ON CLINICAL CONDITION.  Leia Alf Lavontay Kirk M.D on 07/06/2019 at 12:00 PM  Between 7am to 6pm - Pager - [phone]  After 6pm go to [email or web address] - password [email or web address] [facility]  Office  [phone]  CC: Primary care physician; Patient, [name]

## 2019-07-07 MED ORDER — OLANZAPINE 5 MG PO TBDP
15.0000 mg | ORAL_TABLET | Freq: Two times a day (BID) | ORAL | Status: DC
  Administered 2019-07-07: 15 mg via ORAL
  Filled 2019-07-07 (×2): qty 1

## 2019-07-07 MED ORDER — ASENAPINE MALEATE 5 MG SL SUBL
10.0000 mg | SUBLINGUAL_TABLET | Freq: Two times a day (BID) | SUBLINGUAL | Status: DC
  Administered 2019-07-07 – 2019-07-08 (×4): 10 mg via SUBLINGUAL
  Filled 2019-07-07 (×7): qty 2

## 2019-07-07 MED ORDER — ALTEPLASE 2 MG IJ SOLR
2.0000 mg | Freq: Once | INTRAMUSCULAR | Status: AC
  Administered 2019-07-07: 2 mg
  Filled 2019-07-07: qty 2

## 2019-07-07 MED ORDER — FENTANYL CITRATE (PF) 100 MCG/2ML IJ SOLN
25.0000 ug | INTRAMUSCULAR | Status: DC | PRN
  Administered 2019-07-07: 25 ug via INTRAVENOUS
  Filled 2019-07-07 (×2): qty 2

## 2019-07-07 MED ORDER — DIPHENHYDRAMINE HCL 50 MG/ML IJ SOLN
50.0000 mg | Freq: Once | INTRAMUSCULAR | Status: AC
  Administered 2019-07-07: 04:00:00 50 mg via INTRAVENOUS
  Filled 2019-07-07: qty 1

## 2019-07-07 NOTE — Progress Notes (Signed)
D: Pt alert and oriented to person and place. Pt has been agitated off and on throughout day. Pt has tried to leave unit multiple times. Security had to be called multiple times to contain pt. Pt has received PRN medication which have been found unhelpful. Therapeutic communication has been found unhelpful. When pt's mother arrived to visit pt, pt became belligerent w/language toward mother and others. Pt is not processing or progressing in a positive and therapeutic matter on this unit. Pt's unable to hold attention. Pt exaggerates situations, not sure if this is d/t hallucination/delusions. Pt's mother states that suboxone seems to increase agitation at home and that he has not been taking it at home.   A: Scheduled medications administered to pt, per MD orders. Support and encouragement provided. Frequent verbal contact made. Sitter at pt's side.  R: No adverse drug reactions noted. Pt complaint with medications. Pt does not interact well with staff on the unit. Pt remains safe at this time. Will continue to monitor and provide care per physician's orders.

## 2019-07-07 NOTE — Progress Notes (Signed)
Per family suboxone makes him agitated and he told his girlfriend that he stopped taking this at home due to agitation. Per psychiatry Reita Cliche) hold these doses to see if agitation decreases.

## 2019-07-07 NOTE — Progress Notes (Signed)
Pt reports to this writer at end of day that he doesn't believe relieving RN will control his pain well. Pt states it's because this is the same nurse that grabbed him out of the ambulance and gave him a shot. This writer reassured him that it was a Psychiatric nurse. Pt states he doesn't trust people except his mother.

## 2019-07-07 NOTE — Progress Notes (Signed)
Physical Therapy Treatment Patient Details Name: Daymian Lill MRN: [id number] DOB: April 16, 1966 Today's Date: 07/07/2019    History of Present Illness 53 y/o male with a tortuous hospitalization thus far.  He had a fall with R ankle fracture 9/24, underwent ORIF 9/25.  Was aggressive with staff and needed intubated 9/27, finally extubated 10/7.  He has been having issues with AMS/delerium and has not been appropriate for PT.  Pt has been agitated for several days requiring medical sedation 2/2 agression.    PT Comments    Pt very motivated and excited for PT session today.  To edge of bed without assist.  Sitting with supervision for safety due to poor insight and impulsivity.  He is able to maintain NWB well during session.  Walked around unit x 1 with walker and min guard/supervision.  He needs vc's at times to keep walker farther out and not to hop too close to front walker bar.  Corrects and does well with education.  He does fatigue around 100' with decreased step height length noted.  Encouraged standing rest and he is able to recover and continue around unit safely to room.  Pt is making continued daily progress with mobility and overall safety with mobility.  He has met all PT goals at this time.  Will have PT see pt tomorrow to update goals and discharge plan.  SNF was originally recommended but given progression in mobility, may need to change discharge recommendations as appropriate but cognition/agitation/psych concerns may be primary barrier for discharge.  Will need SWS and MD guidance for appropriate discharge disposition.   Follow Up Recommendations  Supervision/Assistance - 24 hour;SNF     Equipment Recommendations  Rolling walker with 5" wheels    Recommendations for Other Services       Precautions / Restrictions Precautions Precautions: Fall Restrictions Weight Bearing Restrictions: Yes RLE Weight Bearing: Non weight bearing    Mobility  Bed Mobility Overal  bed mobility: Needs Assistance Bed Mobility: Supine to Sit     Supine to sit: Modified independent (Device/Increase time);Supervision Sit to supine: Modified independent (Device/Increase time);Supervision   General bed mobility comments: supervision for safety but needs no physical assist  Transfers Overall transfer level: Needs assistance Equipment used: Rolling walker (2 wheeled) Transfers: Sit to/from Stand Sit to Stand: Min guard         General transfer comment: excessive and repeated explanation for where to put hands, to insure NWBing, encouragement to focus on task at hand.  However he did end up rising to standing w/o direct physical assist and kept R foot off the floor  Ambulation/Gait Ambulation/Gait assistance: Min guard Gait Distance (Feet): 165 Feet Assistive device: Rolling walker (2 wheeled) Gait Pattern/deviations: Decreased step length - right Gait velocity: decreased   General Gait Details: hop-to pattern able to maintain NWB well at all times.  verbal cues for standing rest break when fatigued.  Able to walk whole lap around unit without seated rest.   Stairs             Wheelchair Mobility    Modified Rankin (Stroke Patients Only)       Balance Overall balance assessment: Needs assistance Sitting-balance support: Feet supported Sitting balance-Leahy Scale: Good     Standing balance support: Bilateral upper extremity supported;During functional activity Standing balance-Leahy Scale: Fair Standing balance comment: no LOB but min guard at all times due to poor awareness.  Cognition Arousal/Alertness: Awake/alert Behavior During Therapy: Restless;Impulsive                                          Exercises      General Comments        Pertinent Vitals/Pain Pain Assessment: No/denies pain    Home Living                      Prior Function            PT Goals  (current goals can now be found in the care plan section) Progress towards PT goals: Progressing toward goals;PT to reassess next treatment    Frequency    7X/week      PT Plan Current plan remains appropriate    Co-evaluation              AM-PAC PT "6 Clicks" Mobility   Outcome Measure  Help needed turning from your back to your side while in a flat bed without using bedrails?: None Help needed moving from lying on your back to sitting on the side of a flat bed without using bedrails?: None Help needed moving to and from a bed to a chair (including a wheelchair)?: A Little Help needed standing up from a chair using your arms (e.g., wheelchair or bedside chair)?: A Little Help needed to walk in hospital room?: A Little Help needed climbing 3-5 steps with a railing? : A Little 6 Click Score: 20    End of Session Equipment Utilized During Treatment: Gait belt Activity Tolerance: Patient tolerated treatment well Patient left: in bed;with call bell/phone within reach;with nursing/sitter in room Nurse Communication: Mobility status Pain - Right/Left: Right Pain - part of body: Ankle and joints of foot     Time: 6389-3734 PT Time Calculation (min) (ACUTE ONLY): 20 min  Charges:  $Gait Training: 8-22 mins                     Chesley Noon, PTA 07/07/19, 10:09 AM

## 2019-07-07 NOTE — Progress Notes (Signed)
Consult was placed to the IV Team,  as the Red port of the Picc line was occluded;  Upon assessment, also found the purple port to be hard to flush;  2 vials of TPA ordered;  Security is outside the pt's room at present, along with multiple staff members; pt wanting to leave, yelling, uncooperative;  Will wait for staff to contact IV Team when it is safe to approach the pt.  Raynelle Fanning RN IV Team

## 2019-07-07 NOTE — Progress Notes (Signed)
[employer] - [place] at [facility]   PATIENT NAME: Thomas [name]    MR#:  [id number]  DATE OF BIRTH:  November 13, 1965  SUBJECTIVE:   Patient was very agitated earlier in the day and received Geodon.  Calm at this time but continues to be very restless.  IVC placed by psychiatry.  REVIEW OF SYSTEMS:  Review of Systems  Unable to perform ROS: Mental status change   DRUG ALLERGIES:   Allergies  Allergen Reactions  . Haldol [Haloperidol Lactate]    VITALS:  Blood pressure 112/87, pulse 90, temperature 97.6 F (36.4 C), temperature source Oral, resp. rate 18, height 6' 0.01" (1.829 m), weight 66.2 kg, SpO2 100 %.  PHYSICAL EXAMINATION:  Physical Exam Limited exam due to pt cooperation GENERAL:  53 y.o.-year-old patient comfortable but restless LUNGS: Normal breath sounds bilaterally, no wheezing, rales,rhonchi or crepitation. No use of accessory muscles of respiration.  Decreased bibasilar breath sounds CARDIOVASCULAR: S1, S2 normal. No murmurs, rubs, or gallops.  ABDOMEN: Soft, nontender, nondistended. Bowel sounds present. No organomegaly or mass.  EXTREMITIES: No  cyanosis, or clubbing.   Right leg and ankle in a cast.    NEUROLOGIC: Strength symmetrical on left and right PSYCHIATRIC: confused    LABORATORY PANEL:   CBC No results for input(s): WBC, HGB, HCT, PLT in the last 168 hours. ------------------------------------------------------------------------------------------------------------------  Chemistries  Recent Labs  Lab 07/06/19 0519  CREATININE 0.76   ------------------------------------------------------------------------------------------------------------------  Cardiac Enzymes No results for input(s): TROPONINI in the last 168 hours. ------------------------------------------------------------------------------------------------------------------  RADIOLOGY:  No results found.  EKG:   Orders placed or performed during the hospital  encounter of 06/07/19  . EKG 12-Lead  . EKG 12-Lead  . EKG 12-Lead  . EKG 12-Lead  . EKG 12-Lead  . EKG 12-Lead    ASSESSMENT AND PLAN:   JamesRhodesis a53 y.o.malewith a known history of polysubstance abuse including alcohol,schizoaffective bipolar type,seizures,hypertension and insomnia who initially presented on 06/07/2019 following an episode of a fall. Was evaluated in the emergency room and found to have right ankle fracture as well as right foot ischemia.Went into alcohol withdrawal  *History of schizoaffective bipolar type -Being followed by psychiatry service and managing psych meds.   -d/w dr Cindi Carbon on oct 19th--OK to cont geodon q 6 hourly prn  -IV depacon, Thorazine prn, zyprexa, ativan, versed prn  Continues to be agitated needing IV Geodon.  Discussed with psychiatry team to titrate medications.  IVC placed.  *Acute Delirium in the setting of severe schizoaffective/bipolar issue  -CT head normal-normal -TSH, Ammonia, B12. Folate-normal Psychiatry titrating meds *Acute hypoxic respiratory failure secondary to pneumonia.  Possible aspiration. - Intubated for hypoxic respiratory failure and airway protection on 06/10/2019, extubated on 06/20/2019. -Finished IV Unasyn, IV Bactrim -Respiratory cultures with Klebsiella/stenotrophomonas.  *Right lower extremity ischemia -status post revascularization with angioplasty and stent placement by vascular surgery in right superficial femoral artery, right external iliac artery. -Continue aspirin and Plavix  *Right ankle fracture. Status post fall -Patient status post open reduction with internal fixation of right ankle by orthopedic physician--staples removed and cast placed.  -Physical therapy re-ordered -On Suboxone as well  *Alcohol abuse with delirium tremens and now acute metabolic encephalopathy and  inpatient delirium -Continue thiamine,multivitamin and folic acid.  *DVT prophylaxis;Lovenox   CSW working on placement--Difficult placement since pt is NWB due to fracture and severe psychosis.  CODE STATUS: Full code  TOTAL TIME TAKING CARE OF THIS PATIENT: 30 minutes.   POSSIBLE D/C IN 2-3 DAYS,  DEPENDING ON CLINICAL CONDITION.  Leia Alf Marlene Beidler M.D on 07/07/2019 at 11:34 AM  Between 7am to 6pm - Pager - [phone]  After 6pm go to [email or web address] - password [email or web address] [facility]  Office  [phone]  CC: Primary care physician; Patient, [name]

## 2019-07-07 NOTE — Progress Notes (Signed)
Patient IVC'd related to his aggression and threats to others and danger of self harm if leaves as he is not able to ambulate without assistance.  [name], PMHNP

## 2019-07-07 NOTE — Progress Notes (Signed)
Code 300 needed to be called as patient became aggressive and would not follow redirection. MD notified. Orders pending.

## 2019-07-08 NOTE — Progress Notes (Signed)
Patient appears to be in a much better mood, no hurtful words or actions towards staff as compared to last weekend.  Talks about wanting to go home and is hopeful.

## 2019-07-08 NOTE — Progress Notes (Signed)
Physical Therapy Treatment Patient Details Name: Thomas [name] MRN: [id number] DOB: [birth year] Today's Date: 07/08/2019    History of Present Illness 53 y/o male with a tortuous hospitalization thus far.  He had a fall with R ankle fracture 9/24, underwent ORIF 9/25.  Was aggressive with staff and needed intubated 9/27, finally extubated 10/7.  He has been having issues with AMS/delerium and has not been appropriate for PT.  Pt has been agitated for several days requiring medical sedation 2/2 agression.    PT Comments    Pt able to ambulate without rest break today and good use of RW/mainaining NWB status of R LE.  He is familiar with there ex and requires little assistance to move R LE, only VC's for form and setup.  HEP administered and time taken to answer pt's questions.  Pt anxious at beginning of session and perseverating on multiple health concerns.  He responded well to specific positive feedback during mobility and expressed his appreciation several times.  Pt did experience UE fatigue during ambulation but stated that he feels this has improved with practice.  He stated that he has been walking with nursing frequently and that he wants to get "credit" for that distance walked as well.  Pt reported 10/10 pain stating that his pain often feels like it is in his "heel".  RN in to administer pain medication during treatment.  Pt will continue to benefit from skilled PT with focus on strength, tolerance to activity, pain management and safe functional mobility.  Follow Up Recommendations  Supervision/Assistance - 24 hour;SNF     Equipment Recommendations  Rolling walker with 5" wheels    Recommendations for Other Services       Precautions / Restrictions Precautions Precautions: Fall Restrictions Weight Bearing Restrictions: Yes RLE Weight Bearing: Non weight bearing    Mobility  Bed Mobility Overal bed mobility: Modified Independent                Transfers Overall  transfer level: Needs assistance Equipment used: Rolling walker (2 wheeled) Transfers: Sit to/from Stand Sit to Stand: Min guard         General transfer comment: Very eager to stand and impulsive, requiring PT to encourage pt to move slowly and wait for setup.  Pt able to stand without WB on R LE though.  Ambulation/Gait Ambulation/Gait assistance: Min guard Gait Distance (Feet): 180 Feet Assistive device: Rolling walker (2 wheeled)     Gait velocity interpretation: <1.8 ft/sec, indicate of risk for recurrent falls General Gait Details: hop-to pattern able to maintain NWB well at all times.  verbal cues for standing rest break when fatigued.  Reported UE fatigue and that pain is "in his body".  One standing rest break needed.   Stairs             Wheelchair Mobility    Modified Rankin (Stroke Patients Only)       Balance Overall balance assessment: Needs assistance Sitting-balance support: Feet supported Sitting balance-Leahy Scale: Good     Standing balance support: Bilateral upper extremity supported;During functional activity Standing balance-Leahy Scale: Fair Standing balance comment: no LOB but min guard at all times due to poor awareness.  Able to navigate tight spaces in room.                            Cognition Arousal/Alertness: Awake/alert Behavior During Therapy: Impulsive;WFL for tasks assessed/performed;Restless Overall Cognitive Status: No family/caregiver present  to determine baseline cognitive functioning                                 General Comments: Pt restless when PT walks in room but relaxes and starts joking after a minute or two of conversation.  Perseverating on his PMH, off topic, requires redirection; struggles to follow cues as he is significantly distracted.      Exercises General Exercises - Lower Extremity Quad Sets: Strengthening;Right;15 reps;Seated Short Arc Quad: Strengthening;Both;Seated;15  reps(VC's to lift hips slightly to initiate full gluteal contraction.  R LE elevated on pillow.) Hip ABduction/ADduction: Strengthening;Right;15 reps;Seated Straight Leg Raises: Strengthening;Right;15 reps;Seated Other Exercises Other Exercises: Issued HEP and discussed management of exercise schedule and benefit of frequent movement.  x4 min    General Comments        Pertinent Vitals/Pain Pain Assessment: 0-10 Pain Score: 10-Worst pain ever Pain Location: R ankle during ambulation Pain Descriptors / Indicators: Aching Pain Intervention(s): Limited activity within patient's tolerance;Monitored during session    Home Living                      Prior Function            PT Goals (current goals can now be found in the care plan section) Acute Rehab PT Goals Patient Stated Goal: Wants to maintain strength and physical activity. PT Goal Formulation: With patient Time For Goal Achievement: 07/22/19 Potential to Achieve Goals: Good Progress towards PT goals: Progressing toward goals;Goals met and updated - see care plan    Frequency    7X/week      PT Plan Current plan remains appropriate    Co-evaluation              AM-PAC PT "6 Clicks" Mobility   Outcome Measure  Help needed turning from your back to your side while in a flat bed without using bedrails?: None Help needed moving from lying on your back to sitting on the side of a flat bed without using bedrails?: None Help needed moving to and from a bed to a chair (including a wheelchair)?: A Little Help needed standing up from a chair using your arms (e.g., wheelchair or bedside chair)?: A Little Help needed to walk in hospital room?: A Little Help needed climbing 3-5 steps with a railing? : A Little 6 Click Score: 20    End of Session Equipment Utilized During Treatment: Gait belt Activity Tolerance: Patient tolerated treatment well Patient left: with call bell/phone within reach;with  nursing/sitter in room;in chair Nurse Communication: Mobility status PT Visit Diagnosis: Unsteadiness on feet (R26.81);Muscle weakness (generalized) (M62.81);Pain Pain - Right/Left: Right Pain - part of body: Ankle and joints of foot     Time: 0927-0958 PT Time Calculation (min) (ACUTE ONLY): 31 min  Charges:  $Therapeutic Exercise: 8-22 mins $Therapeutic Activity: 8-22 mins                      , PT, DPT      07/08/2019, 1:18 PM   

## 2019-07-08 NOTE — Progress Notes (Signed)
   [place] at [place] NAME: Thomas [name]    MR#:  [id number]  DATE OF BIRTH:  April 03, 1966  SUBJECTIVE:   Improved Sitter at bedside Wants to leave  REVIEW OF SYSTEMS:  Review of Systems  Unable to perform ROS: Mental status change   DRUG ALLERGIES:   Allergies  Allergen Reactions  . Haldol [Haloperidol Lactate]    VITALS:  Blood pressure (!) 128/96, pulse 100, temperature 97.6 F (36.4 C), temperature source Oral, resp. rate 18, height 6' 0.01" (1.829 m), weight 67.2 kg, SpO2 97 %.  PHYSICAL EXAMINATION:  Physical Exam Limited exam due to pt cooperation GENERAL:  53 y.o.-year-old patient comfortable but restless LUNGS: Normal breath sounds bilaterally, no wheezing, rales,rhonchi or crepitation. No use of accessory muscles of respiration.  Decreased bibasilar breath sounds CARDIOVASCULAR: S1, S2 normal. No murmurs, rubs, or gallops.  ABDOMEN: Soft, nontender, nondistended. Bowel sounds present. No organomegaly or mass.  EXTREMITIES: No  cyanosis, or clubbing.   Right leg and ankle in a cast.    NEUROLOGIC: Strength symmetrical on left and right PSYCHIATRIC: confused    LABORATORY PANEL:   CBC No results for input(s): WBC, HGB, HCT, PLT in the last 168 hours. ------------------------------------------------------------------------------------------------------------------  Chemistries  Recent Labs  Lab 07/06/19 0519  CREATININE 0.76   ------------------------------------------------------------------------------------------------------------------  Cardiac Enzymes No results for input(s): TROPONINI in the last 168 hours. ------------------------------------------------------------------------------------------------------------------  RADIOLOGY:  No results found.  EKG:   Orders placed or performed during the hospital encounter of 06/07/19  . EKG 12-Lead  . EKG 12-Lead  . EKG 12-Lead  . EKG 12-Lead  . EKG  12-Lead  . EKG 12-Lead   ASSESSMENT AND PLAN:   JamesRhodesis a53 y.o.malewith a known history of polysubstance abuse including alcohol,schizoaffective bipolar type,seizures,hypertension and insomnia who initially presented on 06/07/2019 following an episode of a fall. Was evaluated in the emergency room and found to have right ankle fracture as well as right foot ischemia.Went into alcohol withdrawal  *History of schizoaffective bipolar type -Being followed by psychiatry service and managing psych meds.   -  cont geodon q 6 hourly prn  -I Depakote, Thorazine prn, zyprexa, ativan, versed prn  IVC placed Will need BHU  * Acute Delirium in the setting of severe schizoaffective/bipolar issue  -CT head normal-normal -TSH, Ammonia, B12. Folate-normal Psychiatry titrating meds  *Acute hypoxic respiratory failure secondary to pneumonia.  Possible aspiration. - Intubated for hypoxic respiratory failure and airway protection on 06/10/2019, extubated on 06/20/2019. -Finished IV Unasyn, IV Bactrim -Respiratory cultures with Klebsiella/stenotrophomonas.  *Right lower extremity ischemia -status post revascularization with angioplasty and stent placement by vascular surgery in right superficial femoral artery, right external iliac artery. -Continue aspirin and Plavix  *Right ankle fracture. Status post fall -Patient status post open reduction with internal fixation of right ankle by orthopedic physician--staples removed and cast placed.  -Physical therapy re-ordered  *Alcohol abuse with delirium tremens and now acute metabolic encephalopathy and  inpatient delirium -Continue thiamine,multivitamin and folic acid.  * DVT prophylaxis;Lovenox  CSW working on placement  CODE STATUS: Full code  TOTAL TIME TAKING CARE OF THIS PATIENT: 30 minutes.   Thomas [name] M.D on 07/08/2019 at 10:50 AM  Between 7am to 6pm - Pager - [phone]  After 6pm go to [email or web address] -  password [email or web address] [facility]  Office  [phone]  CC: Primary care physician; Patient, [name]

## 2019-07-08 NOTE — Progress Notes (Signed)
Upon shift change pt was pleasant. Pt was making statements to sitter not to talk about his foot or walker in front of nurse. This Probation officer assured him that I knew about his foot and that he should be using his walker when walking. Pt was okay with this.

## 2019-07-08 NOTE — Progress Notes (Signed)
D: Pt alert and oriented to person and place, however disoriented to the situation. Pt has delusions of nurses sleeping in his room at night snoring while he is in pain not helping him. Pt has had told about this scenario two days in a roll. Pt makes statements of 15 police officers being in his room when only one security guard came up to make rounds. Pt's agitation has been at a minium during the first half of the day and has slowly started to escalate close to shift chang. Pt has poor insight/judgement and poor attention span. Pt has rapid/loud/loose assoication speech. Pt's mood/affect is agitated. Pt continues to ask about discharging and leaving.  A: Scheduled medications administered to pt, per MD orders. Support and encouragement provided. Frequent verbal contact made. 1:1 sitter at pt's side.  R: No adverse drug reactions noted. Pt complaint with medications. Pt interacts well with staff on the unit off and on. Pt remains safe at this time. Pt is stable, will continue to monitor and provide care as ordered.

## 2019-07-09 MED ORDER — DIVALPROEX SODIUM 500 MG PO DR TAB: 500.0000 mg | DELAYED_RELEASE_TABLET | Freq: Two times a day (BID) | ORAL | 0 refills | Status: AC

## 2019-07-09 MED ORDER — CLOPIDOGREL BISULFATE 75 MG PO TABS: 75.0000 mg | ORAL_TABLET | Freq: Every day | ORAL | 0 refills | Status: AC

## 2019-07-09 MED ORDER — ASPIRIN 81 MG PO CHEW: 81.0000 mg | CHEWABLE_TABLET | Freq: Every day | ORAL | 0 refills | Status: AC

## 2019-07-09 NOTE — BH Assessment (Signed)
IVC  PAPERS  RESCINDED  BY  Dontrey Snellgrove  IN THE  EMERGENCY DEPT  

## 2019-07-09 NOTE — Consult Note (Addendum)
  Patient was reevaluated at approximately 2 PM.  Mother was at the bedside reports that patient is back at baseline.  Patient has denied any suicidal or homicidal ideation.  Patient was denying any audio or visual hallucinations.  Patient was able to calmly explain to writer that he would like to leave the hospital.  Denied any psychotic symptoms.  Patient is cleared from psychiatric point of view.  Cleared for discharge to community.  Does not meet criteria for inpatient admission.  IVC rescinded.

## 2019-07-09 NOTE — TOC Transition Note (Addendum)
Transition of Care [facility]) - CM/SW Discharge Note   Patient Details  Name: Thomas [name] MRN: [id number] Date of Birth: 1966-02-04  Transition of Care [facility]) CM/SW Contact:  Mihailo Sage, Veronia Beets, LCSW Phone Number: [phone]  07/09/2019, 4:58 PM   Clinical Narrative: Psych MD and Medical MD cleared patient to D/C home today. Clinical Education officer, museum (CSW) met with patient and his mother Thomas [name] was at bedside. Patient reported that he is not going to a SNF and will go home today. Patient is agreeable to home health and a rolling walker. Patient does not have a home health agency preference. Per patient he lives in [place]. Per Helene Kelp [facility] home health representative they can accept patient. Patient is agreeable to [facility]. [employer] agency representative is aware that patient needs a rolling walker. CSW delivered walker to patient's room and PT adjusted walker to patient's correct height. Patient reported that he has an outpatient psychiatrist at a [place] clinic. CSW encouraged patient to follow up with his psychiatrist as soon as possible. CSW also provided patient with [facility]. Patient verbalized his understanding. Patient will D/C home today and his mother will transport him. RN aware of above. Please reconsult if future social work needs arise. CSW signing off.      Final next level of care: [place] Barriers to Discharge: Barriers Resolved   Patient Goals and CMS Choice Patient states their goals for this hospitalization and ongoing recovery are:: To go home. CMS Medicare.gov Compare Post Acute Care list provided to:: Patient Choice offered to / list presented to : Patient  Discharge Placement                       Discharge Plan and Services                  DME Agency: [employer] Date DME Agency Contacted: 07/09/19 Time DME Agency Contacted: 18 Representative spoke with at DME Agency: [place]: RN, PT,  OT [place] Agency: [facility] [place] [facility] [employer] Date [place]: 07/09/19 Time [place]: 39 Representative spoke with at [place]: [place] ([place]) Interventions     Readmission Risk Interventions No flowsheet data found.

## 2019-07-09 NOTE — Progress Notes (Signed)
   [place] at [place] NAME: Thomas [name]    MR#:  [id number]  DATE OF BIRTH:  February 25, 1966  SUBJECTIVE:    Sitter at bedside Wants to be discharged IVC in place  REVIEW OF SYSTEMS:  Review of Systems  Unable to perform ROS: Mental status change   DRUG ALLERGIES:   Allergies  Allergen Reactions  . Haldol [Haloperidol Lactate]    VITALS:  Blood pressure 115/90, pulse 98, temperature 97.6 F (36.4 C), temperature source Oral, resp. rate 20, height 6' 0.01" (1.829 m), weight 67.2 kg, SpO2 99 %.  PHYSICAL EXAMINATION:  Physical Exam Limited exam due to pt cooperation GENERAL:  53 y.o.-year-old patient comfortable but restless LUNGS: Normal breath sounds bilaterally, no wheezing, rales,rhonchi or crepitation. No use of accessory muscles of respiration.  Decreased bibasilar breath sounds CARDIOVASCULAR: S1, S2 normal. No murmurs, rubs, or gallops.  ABDOMEN: Soft, nontender, nondistended. Bowel sounds present. No organomegaly or mass.  EXTREMITIES: No  cyanosis, or clubbing.   Right leg and ankle in a cast.    NEUROLOGIC: Strength symmetrical on left and right PSYCHIATRIC: confused    LABORATORY PANEL:   CBC No results for input(s): WBC, HGB, HCT, PLT in the last 168 hours. ------------------------------------------------------------------------------------------------------------------  Chemistries  Recent Labs  Lab 07/06/19 0519  CREATININE 0.76   ------------------------------------------------------------------------------------------------------------------  Cardiac Enzymes No results for input(s): TROPONINI in the last 168 hours. ------------------------------------------------------------------------------------------------------------------  RADIOLOGY:  No results found.  EKG:   Orders placed or performed during the hospital encounter of 06/07/19  . EKG 12-Lead  . EKG 12-Lead  . EKG 12-Lead  . EKG 12-Lead  .  EKG 12-Lead  . EKG 12-Lead   ASSESSMENT AND PLAN:   JamesRhodesis a48 y.o.malewith a known history of polysubstance abuse including alcohol,schizoaffective bipolar type,seizures,hypertension and insomnia who initially presented on 06/07/2019 following an episode of a fall. Was evaluated in the emergency room and found to have right ankle fracture as well as right foot ischemia.Went into alcohol withdrawal  *History of schizoaffective bipolar type -Being followed by psychiatry service and managing psych meds.   -  cont geodon q 6 hourly prn  -I Depakote, Thorazine prn, zyprexa, ativan, versed prn  IVC placed Will need BHU  * Acute Delirium in the setting of severe schizoaffective/bipolar issue  -CT head normal-normal -TSH, Ammonia, B12. Folate-normal Psychiatry titrating meds  *Acute hypoxic respiratory failure secondary to pneumonia.  Possible aspiration. - Intubated for hypoxic respiratory failure and airway protection on 06/10/2019, extubated on 06/20/2019. -Finished IV Unasyn, IV Bactrim -Respiratory cultures with Klebsiella/stenotrophomonas.  *Right lower extremity ischemia -status post revascularization with angioplasty and stent placement by vascular surgery in right superficial femoral artery, right external iliac artery. -Continue aspirin and Plavix  *Right ankle fracture. Status post fall -Patient status post open reduction with internal fixation of right ankle by orthopedic physician--staples removed and cast placed.  -Physical therapy re-ordered  *Alcohol abuse with delirium tremens and now acute metabolic encephalopathy and  inpatient delirium -Continue thiamine,multivitamin and folic acid.  * DVT prophylaxis;Lovenox  CSW working on placement  CODE STATUS: Full code  TOTAL TIME TAKING CARE OF THIS PATIENT: 30 minutes.   [name] M.D on 07/09/2019 at 1:10 PM  Between 7am to 6pm - Pager - [phone]  After 6pm go to [email or web address]  - password [email or web address] [place] [facility]  Office  [phone]  CC: Primary care physician; Patient, [name]

## 2019-07-09 NOTE — Progress Notes (Signed)
RN Beth reviewed discharge instructions with patient and his mother. They verbalized understanding. PICC line removed. Stable condition (baseline) Walker sent home with patient.

## 2019-07-09 NOTE — Progress Notes (Signed)
PT Cancellation Note  Patient Details Name: Thomas [name] MRN: [id number] DOB: 1965-09-30   Cancelled Treatment:    Reason Eval/Treat Not Completed: Other (comment);Fatigue/lethargy limiting ability to participate. Treatment attempted. Sitter in room and notes pt was extremely agitated this morning requiring a security code call. Pt has been medicated and is "finally" sleeping soundly. Will re attempt treatment at a later time if/when pt awakens and can participate safely in PT session.    Larae Grooms, PTA 07/09/2019, 11:41 AM

## 2019-07-09 NOTE — TOC Progression Note (Signed)
Transition of Care [facility]) - Progression Note    Patient Details  Name: Thomas [name] MRN: [id number] Date of Birth: September 06, 1966  [facility]) CM/SW Contact  Treacy Holcomb, Lenice Llamas Phone Number: [phone]  07/09/2019, 9:46 AM  Clinical Narrative: Clinical Social Worker (Vash[place]) made [facility] supervisor aware that patient is now under IVC and has no SNF bed offers. CSW will continue to follow and assist as needed.     Expected Discharge Plan: [place] Barriers to Discharge: Continued Medical Work up  Expected Discharge Plan and Services Expected Discharge Plan: [place] arrangements for the past 2 months: Single Family Home                                       Social Determinants of Health (SDOH) Interventions    Readmission Risk Interventions No flowsheet data found.

## 2019-07-09 NOTE — Progress Notes (Signed)
PT Cancellation Note  Patient Details Name: Thomas [name] MRN: [id number] DOB: 1966-05-10   Cancelled Treatment:    Reason Eval/Treat Not Completed: Other (comment). Pt not seen for treatment; however, seen for several minutes only to adjust new rolling walker to pt height. Pt stood 1 time to test; good fit, pt comfortable and pleased. Pt educated on how to close walker. Pt discharging home at this time.    Larae Grooms, PTA 07/09/2019, 4:35 PM

## 2019-07-10 NOTE — Progress Notes (Signed)
10/27: Clinical Social Worker (CSW) received an email from [facility] representative today stating that they can't accept patient because his address is in [place].  The following home health agencies declined home health referral. [facility]  [facility]  CSW attempted to contact patient however he did not answer and his voicemail was full. CSW contacted patient's mother Elouise and made her aware of above. CSW explained that no home health agencies will accept patient and recommended that he contact Dr. Ammie Ferrier office to arrange outpatient PT. Mother verbalized her understanding and stated she would let patient know the above information. Per mother patient has a new address but she does not know what it is. CSW also attempted to contact patient's significant other [name] however she did not answer.   [employer], LCSW [phone]

## 2019-07-13 ENCOUNTER — Telehealth: Payer: Self-pay | Admitting: Licensed Clinical Social Worker

## 2019-07-13 NOTE — Telephone Encounter (Signed)
Clinical Social Worker (CSW) received a phone call from patient on 10/29. Patient provided his new address in [place], [place]. Per Helene Kelp [facility] home health representative that address is a office building. CSW attempted to contact patient on 10/30 to make him aware of above however he did not answer and a voicemail was left.   [employer], LCSW  [phone]

## 2019-07-14 NOTE — Discharge Summary (Signed)
[facility] [place] NAME: Thomas [name]    MR#:  [id number]  DATE OF BIRTH:  Apr 22, 1966  DATE OF ADMISSION:  06/07/2019 ADMITTING PHYSICIAN: Hillary Bow, MD  DATE OF DISCHARGE: 07/09/2019  4:45 PM  PRIMARY CARE PHYSICIAN: Patient, [name]   ADMISSION DIAGNOSIS:  Reduction defect of lower extremity [Q72.90] Absent foot pulse [R09.89] Closed fracture of right ankle, initial encounter [S82.891A] Ischemia of extremity [I99.8]  DISCHARGE DIAGNOSIS:  Principal Problem:   Ischemia of extremity Active Problems:   Schizoaffective disorder, bipolar type ([place])   Acute respiratory failure ([place])   SECONDARY DIAGNOSIS:   Past Medical History:  Diagnosis Date  . Alcohol abuse   . Anemia   . Bipolar 1 disorder ([place])   . Cervical spine fracture ([place])   . Chronic, continuous use of opioids   . History of seizure   . HTN (hypertension)   . Insomnia   . MDD (major depressive disorder), recurrent, severe, with psychosis ([place])   . Pancreatitis      ADMITTING HISTORY  History of Present Illness: I am asked to evaluate the patient by Dr. Jacqualine Code for loss of Doppler signal in the right foot.  The patient is a 53 year old gentleman who sustained a right ankle fracture earlier today.  Is brought to the emergency room by EMS.  He subsequently underwent closed reduction in the emergency room.  On evaluation by ER staff he was noted to have a cool foot with mild cyanosis and absence of Doppler signals.  By history the patient has known peripheral vascular disease.  ABIs obtained several months ago at [facility] were significant for a right ABI of 0.4 with absence of Doppler signals in the great toe.  Patient denies baseline rest pain symptoms or difficulty with walking.  He denies claudication.  He denies recent open wounds or nonhealing ulcerations.  At the time of my interview he is complaining of intense ankle pain.   HOSPITAL COURSE:    JamesRhodesis a29 y.o.malewith a known history of polysubstance abuse including alcohol,schizoaffective bipolar type,seizures,hypertension and insomnia who initially presented on 06/07/2019 following an episode of a fall. Was evaluated in the emergency room and found to have right ankle fracture as well as right foot ischemia.Went into alcohol withdrawal  *History of schizoaffective bipolar type -Being followed by psychiatry service and managing psych meds.   -  Continued geodon q 6 hourly prn in the hospital - Depakote, Thorazine prn, zyprexa, ativan, versed prn  IVC placed and rescinded by psychiatry prior to discharge  * Acute Delirium in the setting of severe schizoaffective/bipolar issue  -CT head normal-normal -TSH, Ammonia, B12. Folate-normal Psychiatry titrating meds.  Stable by the time of discharge  *Acute hypoxic respiratory failure secondary to pneumonia.  Possible aspiration. - Intubated for hypoxic respiratory failure and airway protection on 06/10/2019, extubated on 06/20/2019. -Finished IV Unasyn, IV Bactrim -Respiratory cultures with Klebsiella/stenotrophomonas.  *Right lower extremity ischemia -status post revascularization with angioplasty and stent placement by vascular surgery in right superficial femoral artery, right external iliac artery. -Continue aspirin and Plavix  *Right ankle fracture. Status post fall -Patient status post open reduction with internal fixation of right ankle by orthopedic physician--staples removed and cast placed.  -Physical therapy re-ordered  *Alcohol abuse with delirium tremens and now acute metabolic encephalopathy and  inpatient delirium -Continue thiamine,multivitamin and folic acid.  Patient stable for discharge to group home.  CONSULTS OBTAINED:  Treatment Team:  Earnestine Leys, MD Mariea Clonts,  Rhea Bleacher, DO Antonieta Pert, MD  DRUG ALLERGIES:   Allergies  Allergen Reactions  . Haldol  [Haloperidol Lactate]     DISCHARGE MEDICATIONS:   Allergies as of 07/09/2019      Reactions   Haldol [haloperidol Lactate]       Medication List    TAKE these medications   albuterol 108 (90 Base) MCG/ACT inhaler Commonly known as: VENTOLIN HFA Inhale 2 puffs into the lungs every 6 (six) hours as needed for wheezing.   aspirin 81 MG chewable tablet Chew 1 tablet (81 mg total) by mouth daily.   clopidogrel 75 MG tablet Commonly known as: PLAVIX Take 1 tablet (75 mg total) by mouth daily.   divalproex 500 MG DR tablet Commonly known as: DEPAKOTE Take 1 tablet (500 mg total) by mouth 2 (two) times daily.   doxepin 100 MG capsule Commonly known as: SINEQUAN Take 200 mg by mouth at bedtime.   pregabalin 200 MG capsule Commonly known as: LYRICA Take 200 mg by mouth 3 (three) times daily.   Suboxone 8-2 MG Film Generic drug: Buprenorphine HCl-Naloxone HCl Place 1 Film under the tongue 3 (three) times daily.       Today   VITAL SIGNS:  Blood pressure 115/90, pulse 98, temperature 97.6 F (36.4 C), temperature source Oral, resp. rate 20, height 6' 0.01" (1.829 m), weight 67.2 kg, SpO2 99 %.  I/O:  No intake or output data in the 24 hours ending 07/14/19 1250  PHYSICAL EXAMINATION:  Physical Exam  GENERAL:  53 y.o.-year-old patient lying in the bed with no acute distress.  LUNGS: Normal breath sounds bilaterally, no wheezing, rales,rhonchi or crepitation. No use of accessory muscles of respiration.  CARDIOVASCULAR: S1, S2 normal. No murmurs, rubs, or gallops.  ABDOMEN: Soft, non-tender, non-distended. Bowel sounds present. No organomegaly or mass.  NEUROLOGIC: Moves all 4 extremities. PSYCHIATRIC: The patient is alert and oriented x 3.  SKIN: No obvious rash, lesion, or ulcer.   DATA REVIEW:   CBC No results for input(s): WBC, HGB, HCT, PLT in the last 168 hours.  Chemistries  No results for input(s): NA, K, CL, CO2, GLUCOSE, BUN, CREATININE, CALCIUM, MG,  AST, ALT, ALKPHOS, BILITOT in the last 168 hours.  Invalid input(s): GFRCGP  Cardiac Enzymes No results for input(s): TROPONINI in the last 168 hours.  Microbiology Results  Results for orders placed or performed during the hospital encounter of 06/07/19  SARS Coronavirus 2 [facility] order, Performed in [facility] hospital lab) Nasopharyngeal Nasopharyngeal Swab     Status: None   Collection Time: 06/07/19  6:34 PM   Specimen: Nasopharyngeal Swab  Result Value Ref Range Status   SARS Coronavirus 2 NEGATIVE NEGATIVE Final    Comment: (NOTE) If result is NEGATIVE SARS-CoV-2 target nucleic acids are NOT DETECTED. The SARS-CoV-2 RNA is generally detectable in upper and lower  respiratory specimens during the acute phase of infection. The lowest  concentration of SARS-CoV-2 viral copies this assay can detect is 250  copies / mL. A negative result does not preclude SARS-CoV-2 infection  and should not be used as the sole basis for treatment or other  patient management decisions.  A negative result may occur with  improper specimen collection / handling, submission of specimen other  than nasopharyngeal swab, presence of viral mutation(s) within the  areas targeted by this assay, and inadequate number of viral copies  (<250 copies / mL). A negative result must be combined with clinical  observations, patient history,  and epidemiological information. If result is POSITIVE SARS-CoV-2 target nucleic acids are DETECTED. The SARS-CoV-2 RNA is generally detectable in upper and lower  respiratory specimens dur ing the acute phase of infection.  Positive  results are indicative of active infection with SARS-CoV-2.  Clinical  correlation with patient history and other diagnostic information is  necessary to determine patient infection status.  Positive results do  not rule out bacterial infection or co-infection with other viruses. If result is PRESUMPTIVE POSTIVE SARS-CoV-2 nucleic acids MAY  BE PRESENT.   A presumptive positive result was obtained on the submitted specimen  and confirmed on repeat testing.  While 2019 novel coronavirus  (SARS-CoV-2) nucleic acids may be present in the submitted sample  additional confirmatory testing may be necessary for epidemiological  and / or clinical management purposes  to differentiate between  SARS-CoV-2 and other Sarbecovirus currently known to infect humans.  If clinically indicated additional testing with an alternate test  methodology [phone]) is advised. The SARS-CoV-2 RNA is generally  detectable in upper and lower respiratory sp ecimens during the acute  phase of infection. The expected result is Negative. Fact Sheet for Patients:  BoilerBrush.com.cy Fact Sheet for Healthcare Providers: https://pope.com/ This test is not yet approved or cleared by the [place] FDA and has been authorized for detection and/or diagnosis of SARS-CoV-2 by FDA under an Emergency Use Authorization (EUA).  This EUA will remain in effect (meaning this test can be used) for the duration of the COVID-19 declaration under Section 564(b)(1) of the Act, 21 U.S.C. section 360bbb-3(b)(1), unless the authorization is terminated or revoked sooner. Performed at [facility], [address] [place], [place] [address]   MRSA PCR Screening     Status: None   Collection Time: 06/08/19  5:53 AM   Specimen: Nasal Mucosa; Nasopharyngeal  Result Value Ref Range Status   MRSA by PCR NEGATIVE NEGATIVE Final    Comment:        The GeneXpert MRSA Assay (FDA approved for NASAL specimens only), is one component of a comprehensive MRSA colonization surveillance program. It is not intended to diagnose MRSA infection nor to guide or monitor treatment for MRSA infections. Performed at [facility], [address] [place], [place] [address]   Culture, respiratory     Status: None    Collection Time: 06/13/19  3:28 PM   Specimen: Tracheal Aspirate  Result Value Ref Range Status   Specimen Description   Final    TRACHEAL ASPIRATE Performed at [facility], [address], [place], [place] [address]    Special Requests   Final    NONE Performed at [facility], [address] [place], [place] [address]    Gram Stain   Final    ABUNDANT WBC PRESENT, PREDOMINANTLY PMN ABUNDANT GRAM POSITIVE COCCI RARE GRAM NEGATIVE RODS Performed at [facility], [address] [place], [place] [address]    Culture   Final    ABUNDANT KLEBSIELLA OXYTOCA ABUNDANT STENOTROPHOMONAS MALTOPHILIA    Report Status 06/17/2019 FINAL  Final   Organism ID, Bacteria KLEBSIELLA OXYTOCA  Final   Organism ID, Bacteria STENOTROPHOMONAS MALTOPHILIA  Final      Susceptibility   Klebsiella oxytoca - MIC*    AMPICILLIN >=32 RESISTANT Resistant     CEFAZOLIN >=64 RESISTANT Resistant     CEFEPIME <=1 SENSITIVE Sensitive     CEFTAZIDIME <=1 SENSITIVE Sensitive     CEFTRIAXONE <=1 SENSITIVE Sensitive  CIPROFLOXACIN <=0.25 SENSITIVE Sensitive     GENTAMICIN <=1 SENSITIVE Sensitive     IMIPENEM <=0.25 SENSITIVE Sensitive     TRIMETH/SULFA <=20 SENSITIVE Sensitive     AMPICILLIN/SULBACTAM 4 SENSITIVE Sensitive     PIP/TAZO <=4 SENSITIVE Sensitive     Extended ESBL NEGATIVE Sensitive     * ABUNDANT KLEBSIELLA OXYTOCA   Stenotrophomonas maltophilia - MIC*    LEVOFLOXACIN 0.25 SENSITIVE Sensitive     TRIMETH/SULFA <=20 SENSITIVE Sensitive     * ABUNDANT STENOTROPHOMONAS MALTOPHILIA  C difficile quick scan w PCR reflex     Status: None   Collection Time: 06/19/19  8:52 AM   Specimen: STOOL  Result Value Ref Range Status   C Diff antigen NEGATIVE NEGATIVE Final   C Diff toxin NEGATIVE NEGATIVE Final   C Diff interpretation No C. difficile detected.  Final    Comment: Performed at [facility], [address] [place], [place] [address]  Urine Culture      Status: None   Collection Time: 06/24/19  2:58 PM   Specimen: Urine, Random  Result Value Ref Range Status   Specimen Description   Final    URINE, RANDOM Performed at [facility], [address] [place], [place] [address]    Special Requests   Final    NONE Performed at [facility], [address], [place], [place] [address]    Culture   Final    NO GROWTH Performed at [facility], [address], [place], [place] [address]    Report Status 06/25/2019 FINAL  Final  CULTURE, BLOOD (ROUTINE X 2) w Reflex to ID Panel     Status: None   Collection Time: 06/24/19  3:26 PM   Specimen: BLOOD  Result Value Ref Range Status   Specimen Description BLOOD BLOOD LEFT WRIST  Final   Special Requests   Final    BOTTLES DRAWN AEROBIC AND ANAEROBIC Blood Culture adequate volume   Culture   Final    NO GROWTH 5 DAYS Performed at [facility], [address] [place], [place] [address]    Report Status 06/29/2019 FINAL  Final  CULTURE, BLOOD (ROUTINE X 2) w Reflex to ID Panel     Status: None   Collection Time: 06/24/19  3:32 PM   Specimen: BLOOD  Result Value Ref Range Status   Specimen Description BLOOD LEFT ANTECUBITAL  Final   Special Requests   Final    BOTTLES DRAWN AEROBIC AND ANAEROBIC Blood Culture results may not be optimal due to an excessive volume of blood received in culture bottles   Culture   Final    NO GROWTH 5 DAYS Performed at [facility], [address], [place], [place] [address]    Report Status 06/29/2019 FINAL  Final    RADIOLOGY:  No results found.  Follow up with PCP in 1 week.  Management plans discussed with the patient, family and they are in agreement.  CODE STATUS:  Code Status History    Date Active Date Inactive Code Status Order ID Comments User Context   06/07/2019 2253 07/09/2019 2008 Full Code [id number]  Schnier, Latina Craver, MD Inpatient   Advance Care Planning Activity      TOTAL TIME  TAKING CARE OF THIS PATIENT ON DAY OF DISCHARGE: more than 30 minutes.   Molinda Bailiff Bentlie Catanzaro M.D on 07/14/2019 at 12:50 PM  Between 7am to 6pm - Pager - [phone]  After 6pm go  to [email or web address] - password [email or web address]  [facility]  Office  [phone]  CC: Primary care physician; Patient, [name]  Note: This dictation was prepared with Dragon dictation along with smaller phrase technology. Any transcriptional errors that result from this process are unintentional.

## 2019-07-17 ENCOUNTER — Telehealth: Payer: Self-pay | Admitting: Licensed Clinical Social Worker

## 2019-07-17 NOTE — Telephone Encounter (Signed)
Clinical Social Worker (CSW) received a call this morning from patient and his significant other [name]. Per patient he has not heard from home health yet and stated that he would like PT. Patient reported that he is doing exercises at home and walking with a walker. CSW explained that the address patient provided CSW was an office building per [facility]. Per [name] it is an office building and studio apartments. CSW contacted [facility] home health representative and made her aware of above. Per Helene Kelp [facility] can see patient for PT this Friday 11/6 or Saturday 11/7. Per Irish Lack will contact patient and his significant other [name] to schedule an appointment. Patient and [name] are aware of above.   [employer], LCSW [phone]

## 2019-07-25 DIAGNOSIS — I998 Other disorder of circulatory system: Secondary | ICD-10-CM | POA: Insufficient documentation

## 2019-07-25 DIAGNOSIS — G9341 Metabolic encephalopathy: Secondary | ICD-10-CM | POA: Insufficient documentation

## 2019-07-25 DIAGNOSIS — S82851S Displaced trimalleolar fracture of right lower leg, sequela: Secondary | ICD-10-CM | POA: Insufficient documentation

## 2019-08-03 ENCOUNTER — Other Ambulatory Visit (INDEPENDENT_AMBULATORY_CARE_PROVIDER_SITE_OTHER): Payer: Self-pay | Admitting: Vascular Surgery

## 2019-08-03 DIAGNOSIS — I70221 Atherosclerosis of native arteries of extremities with rest pain, right leg: Secondary | ICD-10-CM

## 2019-08-03 DIAGNOSIS — Z95820 Peripheral vascular angioplasty status with implants and grafts: Secondary | ICD-10-CM

## 2019-08-13 ENCOUNTER — Other Ambulatory Visit: Payer: Self-pay

## 2019-08-13 ENCOUNTER — Encounter (INDEPENDENT_AMBULATORY_CARE_PROVIDER_SITE_OTHER): Payer: Self-pay | Admitting: Vascular Surgery

## 2019-08-13 ENCOUNTER — Ambulatory Visit (INDEPENDENT_AMBULATORY_CARE_PROVIDER_SITE_OTHER): Payer: Medicaid Other | Admitting: Vascular Surgery

## 2019-08-13 ENCOUNTER — Ambulatory Visit (INDEPENDENT_AMBULATORY_CARE_PROVIDER_SITE_OTHER): Payer: Medicaid Other

## 2019-08-13 VITALS — BP 120/87 | HR 106 | Resp 16

## 2019-08-13 DIAGNOSIS — I10 Essential (primary) hypertension: Secondary | ICD-10-CM

## 2019-08-13 DIAGNOSIS — I70221 Atherosclerosis of native arteries of extremities with rest pain, right leg: Secondary | ICD-10-CM | POA: Diagnosis not present

## 2019-08-13 DIAGNOSIS — J449 Chronic obstructive pulmonary disease, unspecified: Secondary | ICD-10-CM | POA: Diagnosis not present

## 2019-08-13 DIAGNOSIS — I6523 Occlusion and stenosis of bilateral carotid arteries: Secondary | ICD-10-CM

## 2019-08-13 DIAGNOSIS — Z95820 Peripheral vascular angioplasty status with implants and grafts: Secondary | ICD-10-CM

## 2019-08-13 DIAGNOSIS — I70213 Atherosclerosis of native arteries of extremities with intermittent claudication, bilateral legs: Secondary | ICD-10-CM | POA: Diagnosis not present

## 2019-08-13 DIAGNOSIS — I70219 Atherosclerosis of native arteries of extremities with intermittent claudication, unspecified extremity: Secondary | ICD-10-CM | POA: Insufficient documentation

## 2019-08-13 NOTE — Progress Notes (Signed)
MRN : [id number]  Thomas [name] is a 53 y.o. (Sep 04, 1966) male who presents with chief complaint of No chief complaint on file. Marland Kitchen  History of Present Illness:   The patient returns to the office for followup and review status post angiogram with intervention.  Procedure performed 06/07/2019:  Percutaneous transluminal angioplasty and stent placement right superficial femoral artery with percutaneous transluminal angioplasty and stent placement right external iliac artery.   The patient notes improvement in the lower extremity symptoms. No interval shortening of the patient's claudication distance or rest pain symptoms. Previous wounds have now healed.  No new ulcers or wounds have occurred since the last visit.  There have been no significant changes to the patient's overall health care.  The patient denies amaurosis fugax or recent TIA symptoms. There are no recent neurological changes noted. The patient denies history of DVT, PE or superficial thrombophlebitis. The patient denies recent episodes of angina or shortness of breath.   ABI's Rt=1.41 and Lt=1.26    No outpatient medications have been marked as taking for the 08/13/19 encounter (Appointment) with Gilda Crease, Latina Craver, MD.    Past Medical History:  Diagnosis Date  . Alcohol abuse   . Anemia   . Bipolar 1 disorder (HCC)   . Cervical spine fracture (HCC)   . Chronic, continuous use of opioids   . History of seizure   . HTN (hypertension)   . Insomnia   . MDD (major depressive disorder), recurrent, severe, with psychosis (HCC)   . Pancreatitis     Past Surgical History:  Procedure Laterality Date  . LOWER EXTREMITY ANGIOGRAPHY Right 06/07/2019   Procedure: Lower Extremity Angiography;  Surgeon: Renford Dills, MD;  Location: [facility] INVASIVE CV LAB;  Service: Cardiovascular;  Laterality: Right;  . ORIF ANKLE FRACTURE Right 06/08/2019   Procedure: OPEN REDUCTION INTERNAL FIXATION (ORIF) ANKLE FRACTURE;  Surgeon:  Deeann Saint, MD;  Location: [facility] ORS;  Service: Orthopedics;  Laterality: Right;    Social History Social History   Tobacco Use  . Smoking status: Current Every Day Smoker  . Smokeless tobacco: Never Used  Substance Use Topics  . Alcohol use: Yes    Comment: occasionally  . Drug use: Not on file    Family History No family history on file.  Allergies  Allergen Reactions  . Haldol [Haloperidol Lactate]      REVIEW OF SYSTEMS (Negative unless checked)  Constitutional: [] Weight loss  [] Fever  [] Chills Cardiac: [] Chest pain   [] Chest pressure   [] Palpitations   [] Shortness of breath when laying flat   [] Shortness of breath with exertion. Vascular:  [x] Pain in legs with walking   [] Pain in legs at rest  [] History of DVT   [] Phlebitis   [] Swelling in legs   [] Varicose veins   [] Non-healing ulcers Pulmonary:   [] Uses home oxygen   [] Productive cough   [] Hemoptysis   [] Wheeze  [] COPD   [] Asthma Neurologic:  [] Dizziness   [] Seizures   [] History of stroke   [] History of TIA  [] Aphasia   [] Vissual changes   [] Weakness or numbness in arm   [] Weakness or numbness in leg Musculoskeletal:   [] Joint swelling   [] Joint pain   [] Low back pain Hematologic:  [] Easy bruising  [] Easy bleeding   [] Hypercoagulable state   [] Anemic Gastrointestinal:  [] Diarrhea   [] Vomiting  [] Gastroesophageal reflux/heartburn   [] Difficulty swallowing. Genitourinary:  [] Chronic kidney disease   [] Difficult urination  [] Frequent urination   [] Blood in urine Skin:  [] Rashes   []   Ulcers  Psychological:  [] History of anxiety   []  History of major depression.  Physical Examination  There were no vitals filed for this visit. There is no height or weight on file to calculate BMI. Gen: WD/WN, NAD Head: [place]/AT, No temporalis wasting.  Ear/Nose/Throat: Hearing grossly intact, nares w/o erythema or drainage Eyes: PER, EOMI, sclera nonicteric.  Neck: Supple, no large masses.   Pulmonary:  Good air movement, no audible  wheezing bilaterally, no use of accessory muscles.  Cardiac: RRR, no JVD Vascular:  Vessel Right Left  Radial Palpable Palpable  PT Trace Palpable Trace Palpable  DP Trace Palpable Trace Palpable  Gastrointestinal: Non-distended. No guarding/no peritoneal signs.  Musculoskeletal: M/S 5/5 throughout.  No deformity or atrophy.  Neurologic: CN 2-12 intact. Symmetrical.  Speech is fluent. Motor exam as listed above. Psychiatric: Judgment intact, Mood & affect appropriate for pt's clinical situation. Dermatologic: No rashes or ulcers noted.  No changes consistent with cellulitis. Lymph : No lichenification or skin changes of chronic lymphedema.  CBC Lab Results  Component Value Date   WBC 7.1 06/26/2019   HGB 9.4 (L) 06/26/2019   HCT 29.0 (L) 06/26/2019   MCV 102.1 (H) 06/26/2019   PLT 344 06/26/2019    BMET    Component Value Date/Time   NA 139 06/30/2019 0405   K 4.5 06/30/2019 0405   CL 102 06/30/2019 0405   CO2 25 06/30/2019 0405   GLUCOSE 89 06/30/2019 0405   BUN 18 06/30/2019 0405   CREATININE 0.76 07/06/2019 0519   CALCIUM 9.2 06/30/2019 0405   GFRNONAA >60 07/06/2019 0519   GFRAA >60 07/06/2019 0519   CrCl cannot be calculated (Patient's most recent lab result is older than the maximum 21 days allowed.).  COAG Lab Results  Component Value Date   INR 1.0 06/07/2019    Radiology No results found.   Assessment/Plan 1. Atherosclerosis of native artery of both lower extremities with intermittent claudication (HCC)  Recommend:  The patient has evidence of atherosclerosis of the lower extremities with claudication.  The patient does not voice lifestyle limiting changes at this point in time.  Noninvasive studies do not suggest clinically significant change.  No invasive studies, angiography or surgery at this time The patient should continue walking and begin a more formal exercise program.  The patient should continue antiplatelet therapy and aggressive  treatment of the lipid abnormalities  No changes in the patient's medications at this time  The patient should continue wearing graduated compression socks 10-15 mmHg strength to control the mild edema.   - ABI; Future - LE ARTERIAL; Future  2. Calcification of both carotid arteries Recommend:  Given the patient's asymptomatic subcritical stenosis no further invasive testing or surgery at this time.  Continue antiplatelet therapy as prescribed Continue management of CAD, HTN and Hyperlipidemia Healthy heart diet,  encouraged exercise at least 4 times per week Follow up in 12 months with duplex ultrasound and physical exam   3. Chronic obstructive pulmonary disease, unspecified COPD type ([place]) Continue pulmonary medications and aerosols as already ordered, these medications have been reviewed and there are no changes at this time.    4. Essential hypertension Continue antihypertensive medications as already ordered, these medications have been reviewed and there are no changes at this time.   Hortencia Pilar, MD  08/13/2019 8:20 AM

## 2019-11-15 ENCOUNTER — Ambulatory Visit (INDEPENDENT_AMBULATORY_CARE_PROVIDER_SITE_OTHER): Payer: Medicaid Other

## 2019-11-15 ENCOUNTER — Other Ambulatory Visit: Payer: Self-pay

## 2019-11-15 ENCOUNTER — Encounter (INDEPENDENT_AMBULATORY_CARE_PROVIDER_SITE_OTHER): Payer: Self-pay | Admitting: Vascular Surgery

## 2019-11-15 ENCOUNTER — Ambulatory Visit (INDEPENDENT_AMBULATORY_CARE_PROVIDER_SITE_OTHER): Payer: Medicaid Other | Admitting: Vascular Surgery

## 2019-11-15 VITALS — BP 107/74 | HR 99 | Resp 12 | Ht 79.0 in | Wt 187.0 lb

## 2019-11-15 DIAGNOSIS — I6523 Occlusion and stenosis of bilateral carotid arteries: Secondary | ICD-10-CM

## 2019-11-15 DIAGNOSIS — J449 Chronic obstructive pulmonary disease, unspecified: Secondary | ICD-10-CM | POA: Diagnosis not present

## 2019-11-15 DIAGNOSIS — I70213 Atherosclerosis of native arteries of extremities with intermittent claudication, bilateral legs: Secondary | ICD-10-CM

## 2019-11-15 DIAGNOSIS — I10 Essential (primary) hypertension: Secondary | ICD-10-CM | POA: Diagnosis not present

## 2019-11-15 NOTE — Progress Notes (Signed)
MRN : [id number]  Thomas [name] is a 54 y.o. ([birth year]) male who presents with chief complaint of No chief complaint on file. Marland Kitchen  History of Present Illness:  The patient returns to the office for followup and review status post angiogram with intervention.  Procedure performed 06/07/2019:  Percutaneous transluminal angioplasty and stent placementrightsuperficial femoral artery with percutaneous transluminal angioplasty and stent placement right external iliac artery.   The patient continues to complain of pain over his right lateral malleolus as well as painful numbness of the third fourth and fifth toes of the right foot. No interval shortening of the patient's claudication distance or rest pain symptoms. Previous wounds have now healed.  No new ulcers or wounds have occurred since the last visit.  There have been no significant changes to the patient's overall health care.  The patient denies amaurosis fugax or recent TIA symptoms. There are no recent neurological changes noted. The patient denies history of DVT, PE or superficial thrombophlebitis. The patient denies recent episodes of angina or shortness of breath.   ABI's Rt=1.15 and Lt=1.21  (previous ABI's Rt=1.41 and Lt=1.26) Duplex ultrasound   No outpatient medications have been marked as taking for the 11/15/19 encounter (Appointment) with Gilda Crease, Latina Craver, MD.    Past Medical History:  Diagnosis Date  . Alcohol abuse   . Anemia   . Bipolar 1 disorder (HCC)   . Cervical spine fracture (HCC)   . Chronic, continuous use of opioids   . History of seizure   . HTN (hypertension)   . Insomnia   . MDD (major depressive disorder), recurrent, severe, with psychosis (HCC)   . Pancreatitis     Past Surgical History:  Procedure Laterality Date  . LOWER EXTREMITY ANGIOGRAPHY Right 06/07/2019   Procedure: Lower Extremity Angiography;  Surgeon: Renford Dills, MD;  Location: [facility] INVASIVE CV LAB;  Service:  Cardiovascular;  Laterality: Right;  . ORIF ANKLE FRACTURE Right 06/08/2019   Procedure: OPEN REDUCTION INTERNAL FIXATION (ORIF) ANKLE FRACTURE;  Surgeon: Deeann Saint, MD;  Location: [facility] ORS;  Service: Orthopedics;  Laterality: Right;    Social History Social History   Tobacco Use  . Smoking status: Current Every Day Smoker  . Smokeless tobacco: Never Used  Substance Use Topics  . Alcohol use: Yes    Comment: occasionally  . Drug use: Not on file    Family History Family History  Problem Relation Age of Onset  . Aneurysm Father     Allergies  Allergen Reactions  . Haldol [Haloperidol Lactate]   . Other      REVIEW OF SYSTEMS (Negative unless checked)  Constitutional: [] Weight loss  [] Fever  [] Chills Cardiac: [] Chest pain   [] Chest pressure   [] Palpitations   [] Shortness of breath when laying flat   [] Shortness of breath with exertion. Vascular:  [x] Pain in legs with walking   [] Pain in legs at rest  [] History of DVT   [] Phlebitis   [] Swelling in legs   [] Varicose veins   [] Non-healing ulcers Pulmonary:   [] Uses home oxygen   [] Productive cough   [] Hemoptysis   [] Wheeze  [] COPD   [] Asthma Neurologic:  [] Dizziness   [] Seizures   [] History of stroke   [] History of TIA  [] Aphasia   [] Vissual changes   [] Weakness or numbness in arm   [] Weakness or numbness in leg Musculoskeletal:   [] Joint swelling   [] Joint pain   [] Low back pain Hematologic:  [] Easy bruising  [] Easy bleeding   [] Hypercoagulable  state   [] Anemic Gastrointestinal:  [] Diarrhea   [] Vomiting  [] Gastroesophageal reflux/heartburn   [] Difficulty swallowing. Genitourinary:  [] Chronic kidney disease   [] Difficult urination  [] Frequent urination   [] Blood in urine Skin:  [] Rashes   [] Ulcers  Psychological:  [] History of anxiety   []  History of major depression.  Physical Examination  There were no vitals filed for this visit. There is no height or weight on file to calculate BMI. Gen: WD/WN, NAD Head: [place]/AT, No  temporalis wasting.  Ear/Nose/Throat: Hearing grossly intact, nares w/o erythema or drainage Eyes: PER, EOMI, sclera nonicteric.  Neck: Supple, no large masses.   Pulmonary:  Good air movement, no audible wheezing bilaterally, no use of accessory muscles.  Cardiac: RRR, no JVD Vascular: scattered varicosities present bilaterally.  Mild venous stasis changes to the legs bilaterally.  1+ soft pitting edema Vessel Right Left  Radial Palpable Palpable  PT Not Palpable Not Palpable  DP Not Palpable Not Palpable  Gastrointestinal: Non-distended. No guarding/no peritoneal signs.  Musculoskeletal: M/S 5/5 throughout.  No deformity or atrophy.  Neurologic: CN 2-12 intact. Symmetrical.  Speech is fluent. Motor exam as listed above. Psychiatric: Judgment intact, Mood & affect appropriate for pt's clinical situation. Dermatologic: No rashes or ulcers noted.  No changes consistent with cellulitis.   CBC Lab Results  Component Value Date   WBC 7.1 06/26/2019   HGB 9.4 (L) 06/26/2019   HCT 29.0 (L) 06/26/2019   MCV 102.1 (H) 06/26/2019   PLT 344 06/26/2019    BMET    Component Value Date/Time   NA 139 06/30/2019 0405   K 4.5 06/30/2019 0405   CL 102 06/30/2019 0405   CO2 25 06/30/2019 0405   GLUCOSE 89 06/30/2019 0405   BUN 18 06/30/2019 0405   CREATININE 0.76 07/06/2019 0519   CALCIUM 9.2 06/30/2019 0405   GFRNONAA >60 07/06/2019 0519   GFRAA >60 07/06/2019 0519   CrCl cannot be calculated (Patient's most recent lab result is older than the maximum 21 days allowed.).  COAG Lab Results  Component Value Date   INR 1.0 06/07/2019    Radiology No results found.   Assessment/Plan 1. Atherosclerosis of native artery of both lower extremities with intermittent claudication (HCC)  Recommend:  The patient has evidence of atherosclerosis of the lower extremities with claudication.  The patient does not voice lifestyle limiting changes at this point in time.  Noninvasive studies do  not suggest clinically significant change.  No invasive studies, angiography or surgery at this time The patient should continue walking and begin a more formal exercise program.  The patient should continue antiplatelet therapy and aggressive treatment of the lipid abnormalities  No changes in the patient's medications at this time  The patient should continue wearing graduated compression socks 10-15 mmHg strength to control the mild edema.   - VAS [place] ABI WITH/WO TBI; Future  2. Calcification of both carotid arteries Recommend:  Given the patient's asymptomatic subcritical stenosis no further invasive testing or surgery at this time.  Duplex ultrasound shows <50% stenosis bilaterally.  Continue antiplatelet therapy as prescribed Continue management of CAD, HTN and Hyperlipidemia Healthy heart diet,  encouraged exercise at least 4 times per week Follow up in 12 months with duplex ultrasound and physical exam  - VAS US CAROTID; Future  3. Essential hypertension Continue antihypertensive medications as already ordered, these medications have been reviewed and there are no changes at this time.   4. Chronic obstructive pulmonary disease, unspecified COPD type ([place])  Continue pulmonary medications and aerosols as already ordered, these medications have been reviewed and there are no changes at this time.    Levora Dredge, MD  11/15/2019 1:13 PM

## 2019-11-16 ENCOUNTER — Encounter (INDEPENDENT_AMBULATORY_CARE_PROVIDER_SITE_OTHER): Payer: Self-pay | Admitting: Vascular Surgery

## 2020-05-26 ENCOUNTER — Ambulatory Visit (INDEPENDENT_AMBULATORY_CARE_PROVIDER_SITE_OTHER): Payer: Medicaid Other | Admitting: Vascular Surgery

## 2020-05-26 ENCOUNTER — Encounter (INDEPENDENT_AMBULATORY_CARE_PROVIDER_SITE_OTHER): Payer: Medicaid Other

## 2020-06-16 ENCOUNTER — Ambulatory Visit (INDEPENDENT_AMBULATORY_CARE_PROVIDER_SITE_OTHER): Payer: Medicaid Other | Admitting: Vascular Surgery

## 2020-06-16 ENCOUNTER — Encounter (INDEPENDENT_AMBULATORY_CARE_PROVIDER_SITE_OTHER): Payer: Medicaid Other

## 2020-07-03 ENCOUNTER — Encounter (INDEPENDENT_AMBULATORY_CARE_PROVIDER_SITE_OTHER): Payer: Medicaid Other

## 2020-07-03 ENCOUNTER — Ambulatory Visit (INDEPENDENT_AMBULATORY_CARE_PROVIDER_SITE_OTHER): Payer: Medicaid Other | Admitting: Vascular Surgery

## 2020-10-26 IMAGING — DX DG ANKLE COMPLETE 3+V*R*
2 series · 2 of 2 positions shown · non-contrast
Comparison: None.

CLINICAL DATA: Per EMS report, patient slipped and fell in his
kitchen and was witnessed by his wife. Patient has deformity to
right ankle. Patient was given 37mcg of Fentanyl en route by EMT.
Patient denies relief of the pain. Patient arrives with right lower
leg splinted.

EXAM:
RIGHT ANKLE - COMPLETE 3+ VIEW

[ankle ap]
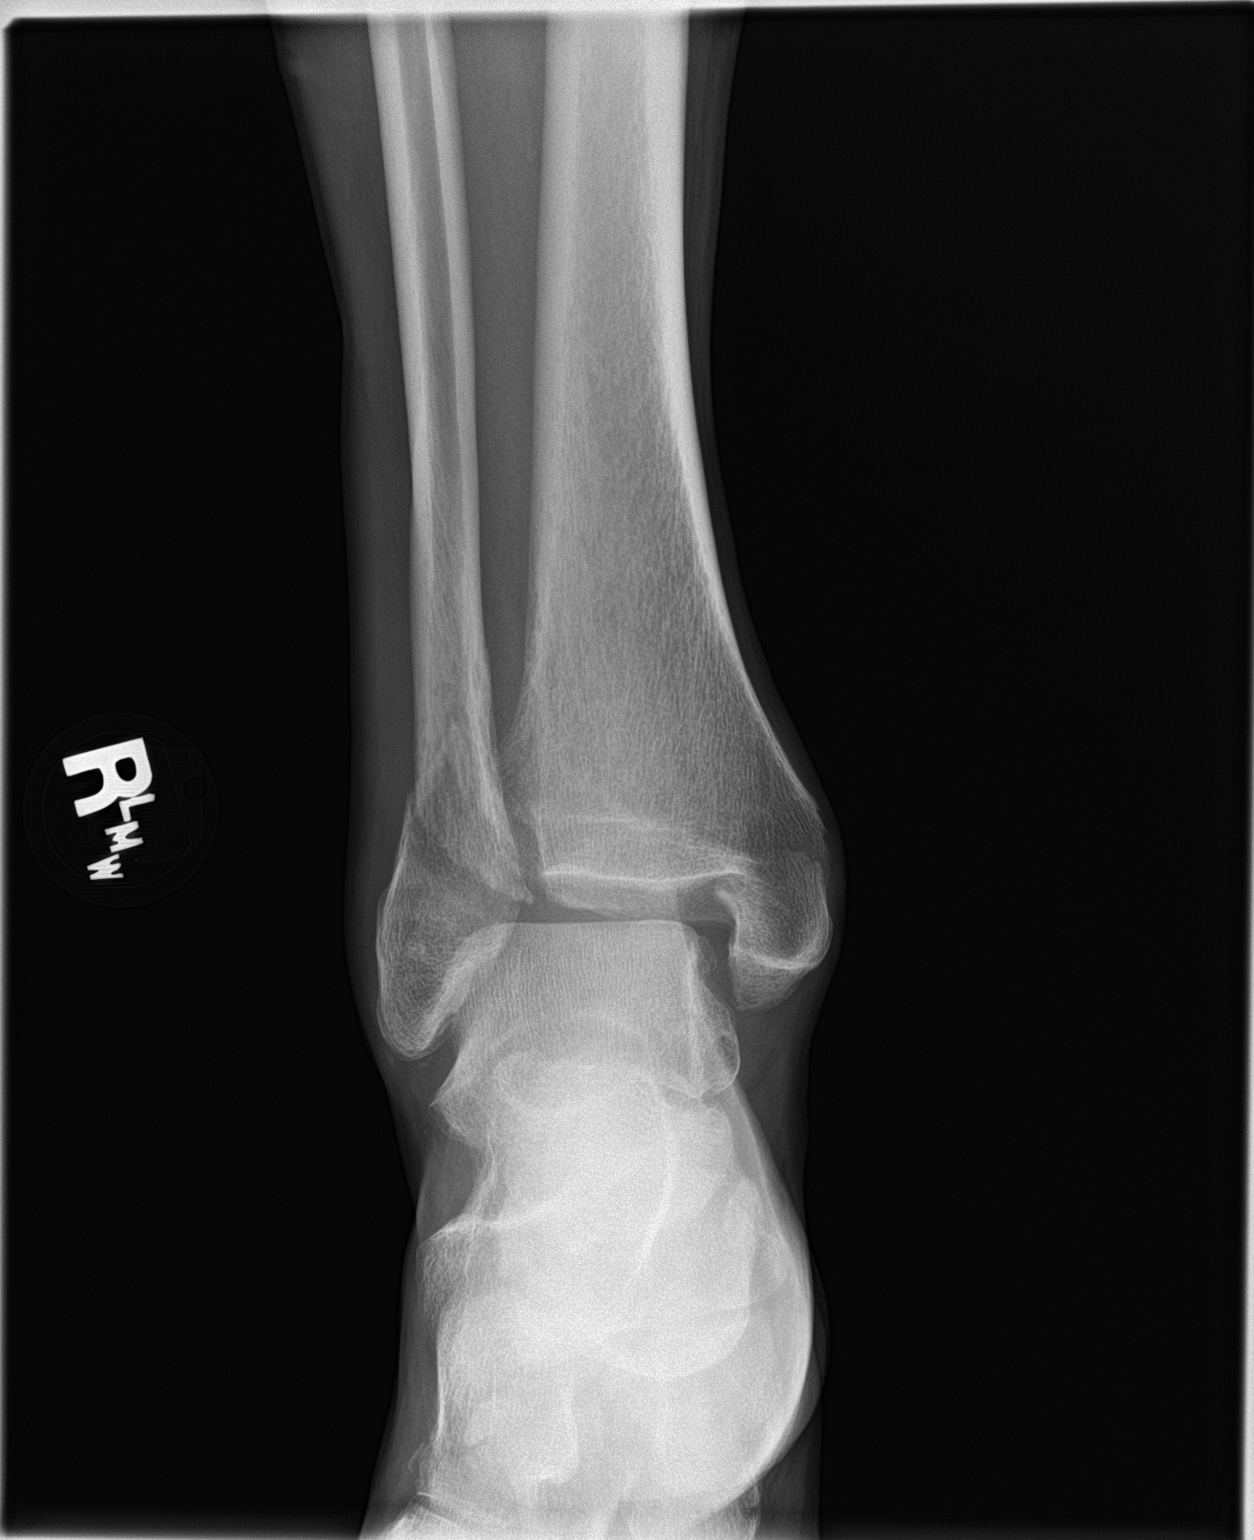

[ankle lat]
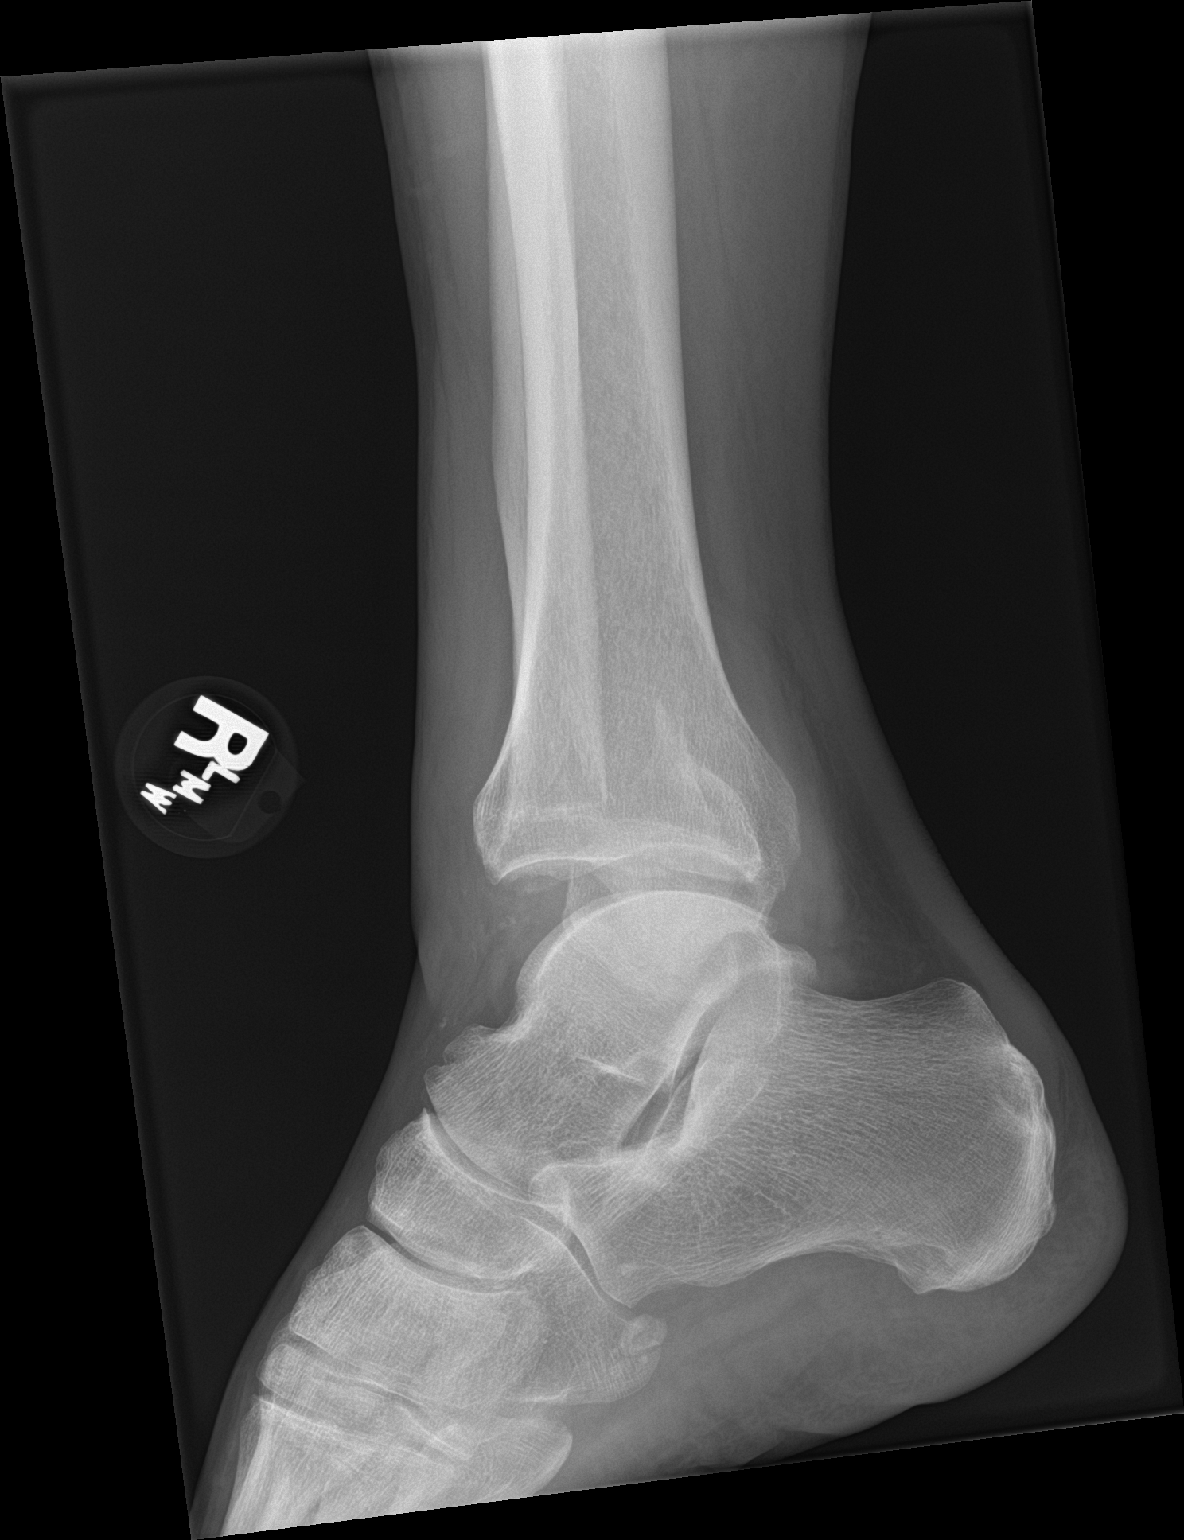

[2 of 2 positions shown; findings below may reference images not displayed]

FINDINGS: There is a transverse fracture across the base of the medial
malleolus and an oblique fracture of the distal fibula extending
from the metadiaphysis to the distal metaphysis at the level of the
ankle joint. The talus is subluxed posteriorly and laterally by
approximately 5 mm.

The distal fibular fracture is displaced posteriorly by 1.3 cm.

There is surrounding soft tissue swelling.
IMPRESSION: 1. Displaced fractures of the medial malleolus and distal fibula
with subluxation of the talus as detailed. No dislocation.

## 2020-10-29 IMAGING — DX DG CHEST 1V PORT
1 series · 2 of 2 positions shown · non-contrast
Comparison: None.

CLINICAL DATA: Endotracheal tube placement.

EXAM:
PORTABLE CHEST 1 VIEW

[Series 1: chest ap · 0.14mm/px · 2 of 2 slices shown]
[im 1/2]
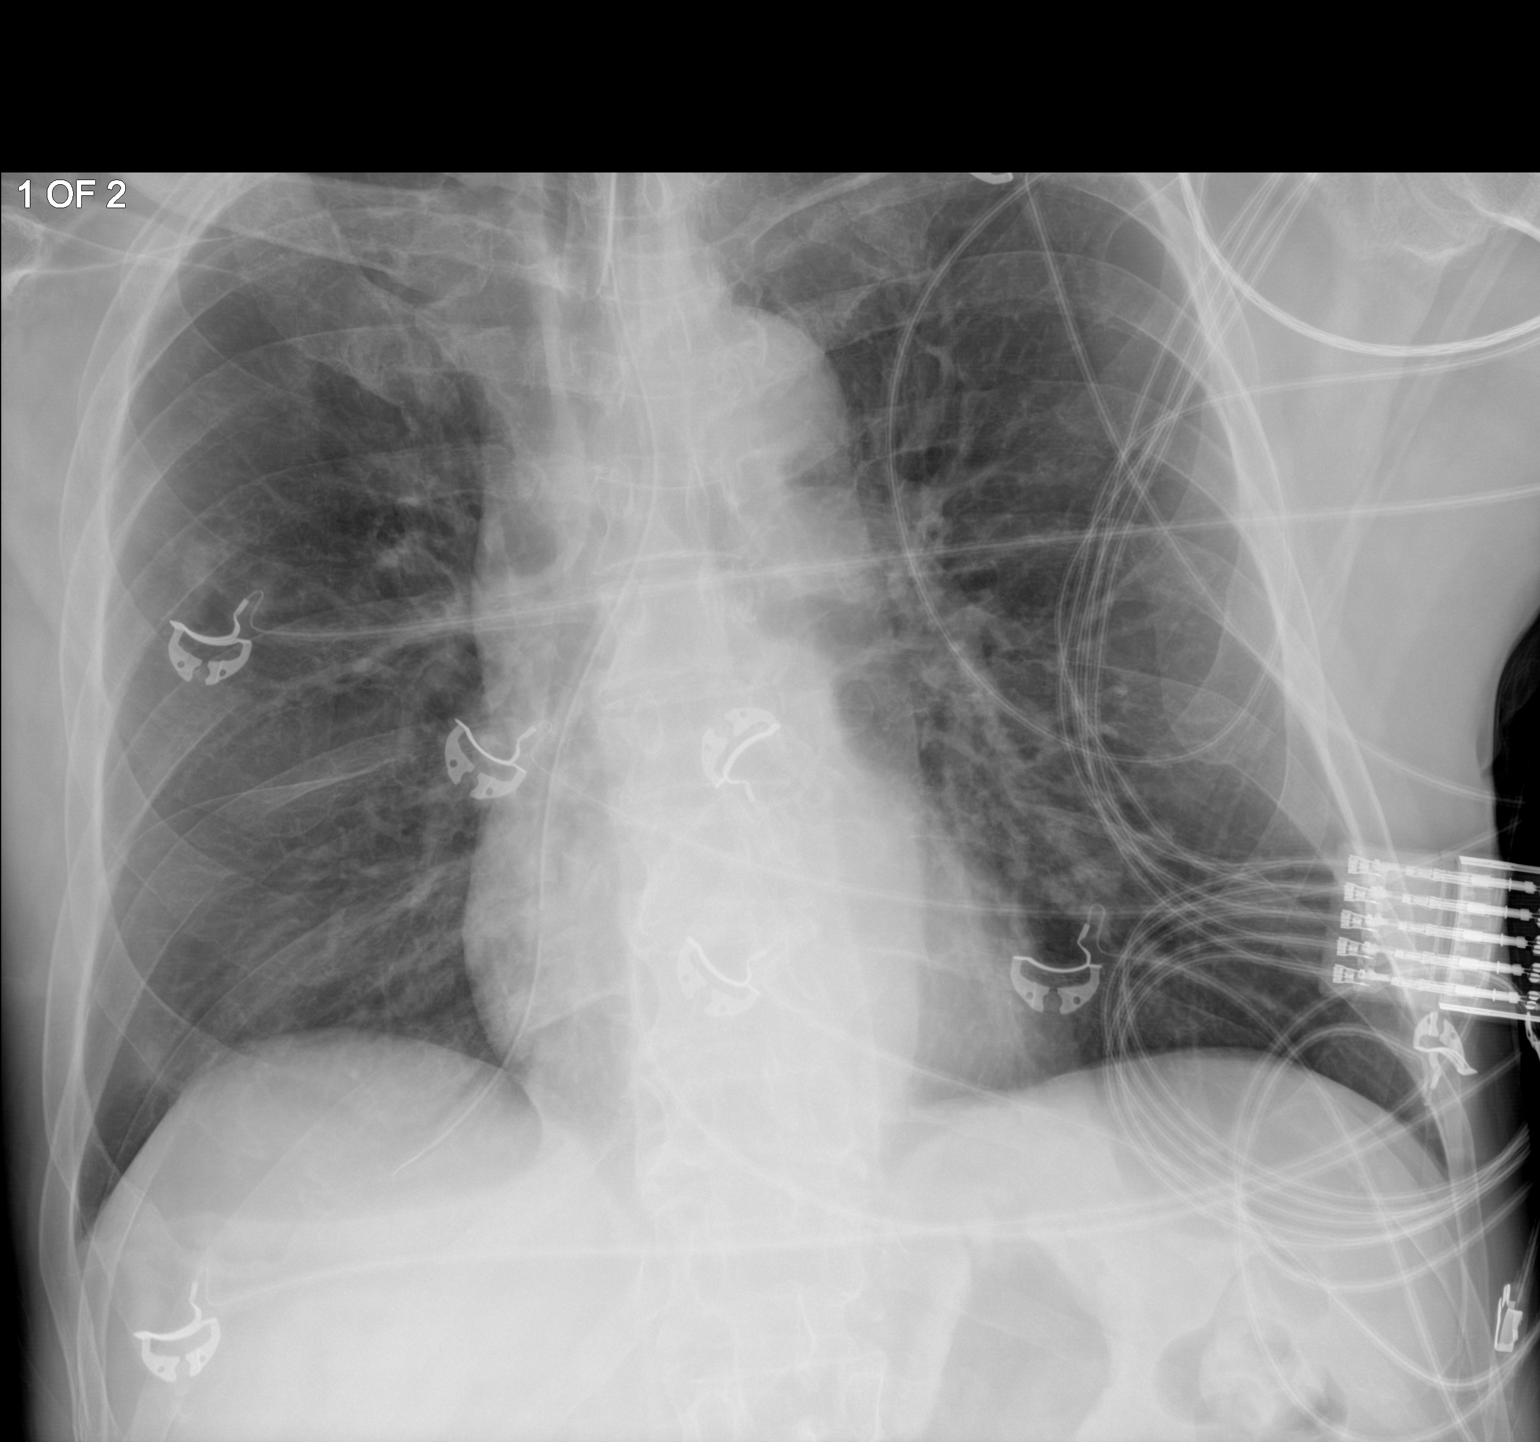
[im 2/2]
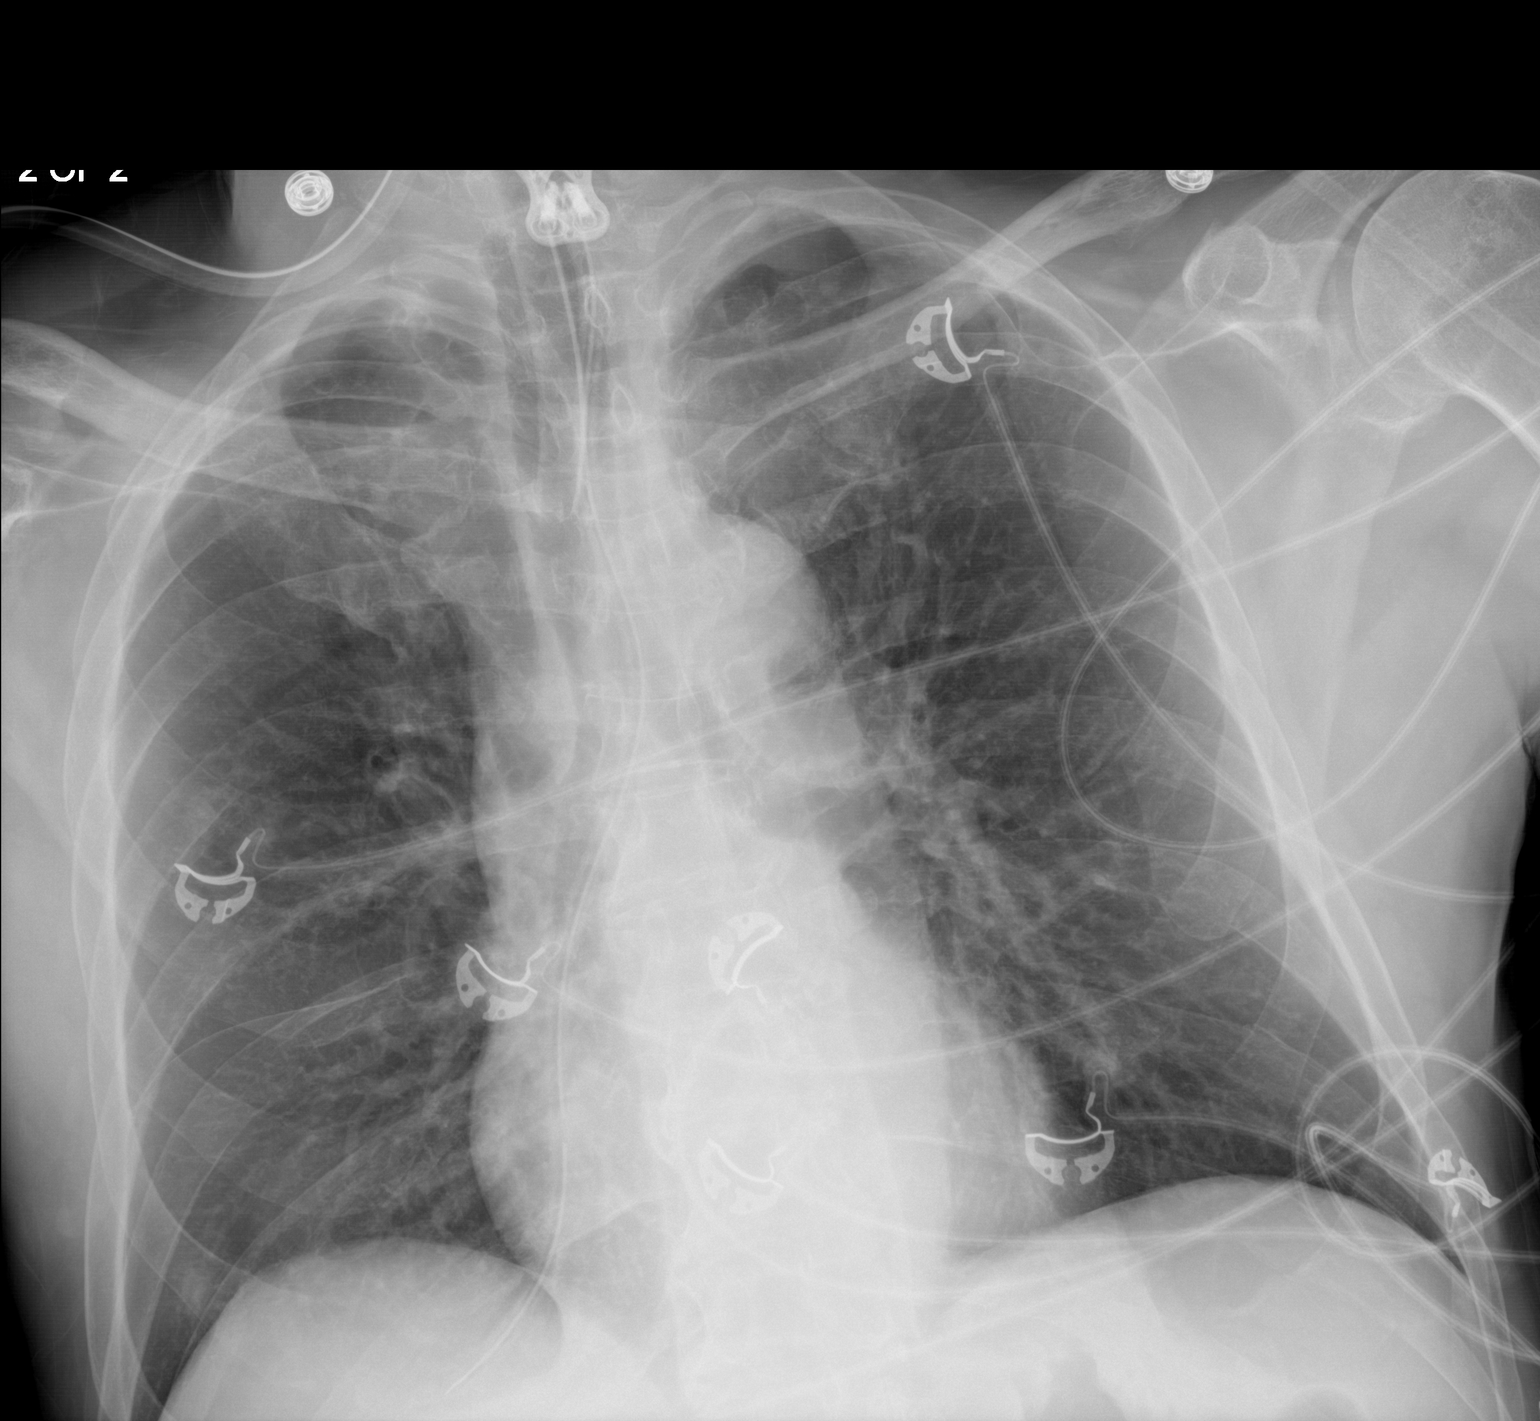

[2 of 2 positions shown; findings below may reference images not displayed]

FINDINGS: The heart size and mediastinal contours are within normal limits.
Endotracheal tube is in grossly good position. Nasogastric tube is
seen in right lower lobe bronchus. No pneumothorax or pleural
effusion is noted. Both lungs are clear. The visualized skeletal
structures are unremarkable.
IMPRESSION: Endotracheal tube in grossly good position. Nasogastric tube seen in
right lower lobe bronchus; I spoke with the patient's nurse, Ceejay,
who stated that they are aware of this finding. No other abnormality
seen in the chest.

## 2020-10-29 IMAGING — DX DG ABDOMEN 1V
1 series · 1 of 1 positions shown · non-contrast
Comparison: None.

CLINICAL DATA: Evaluate NG tube

EXAM:
ABDOMEN - 1 VIEW

[abdomen supine]
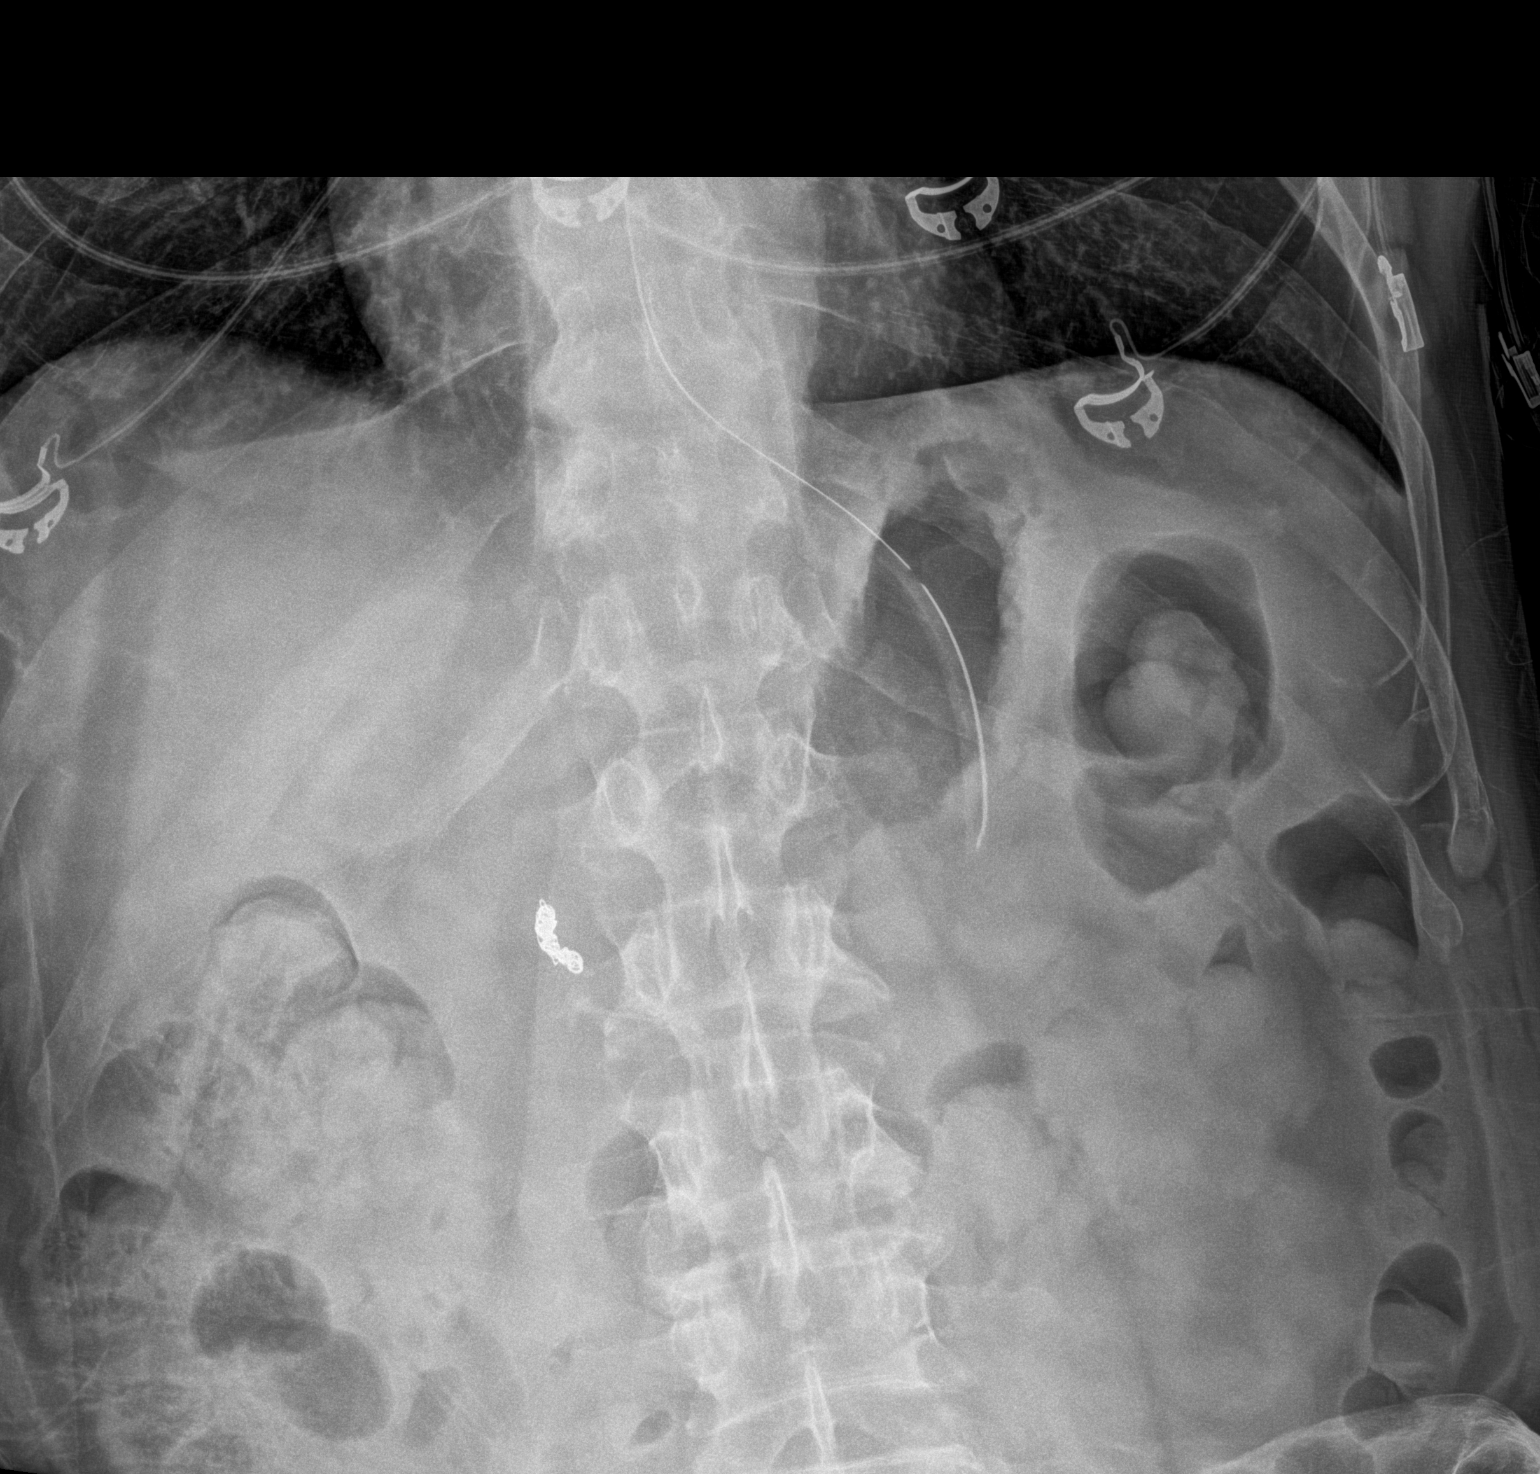

[1 of 1 positions shown; findings below may reference images not displayed]

FINDINGS: The NG tube is been reposition with the side port and distal tip now
located in the stomach.
IMPRESSION: The NG tube terminates in the stomach.

## 2020-10-30 IMAGING — DX DG ABDOMEN 1V
1 series · 1 of 1 positions shown · non-contrast
Comparison: 06/10/2019

CLINICAL DATA: Oral gastric tube placement

EXAM:
ABDOMEN - 1 VIEW

[abdomen supine]
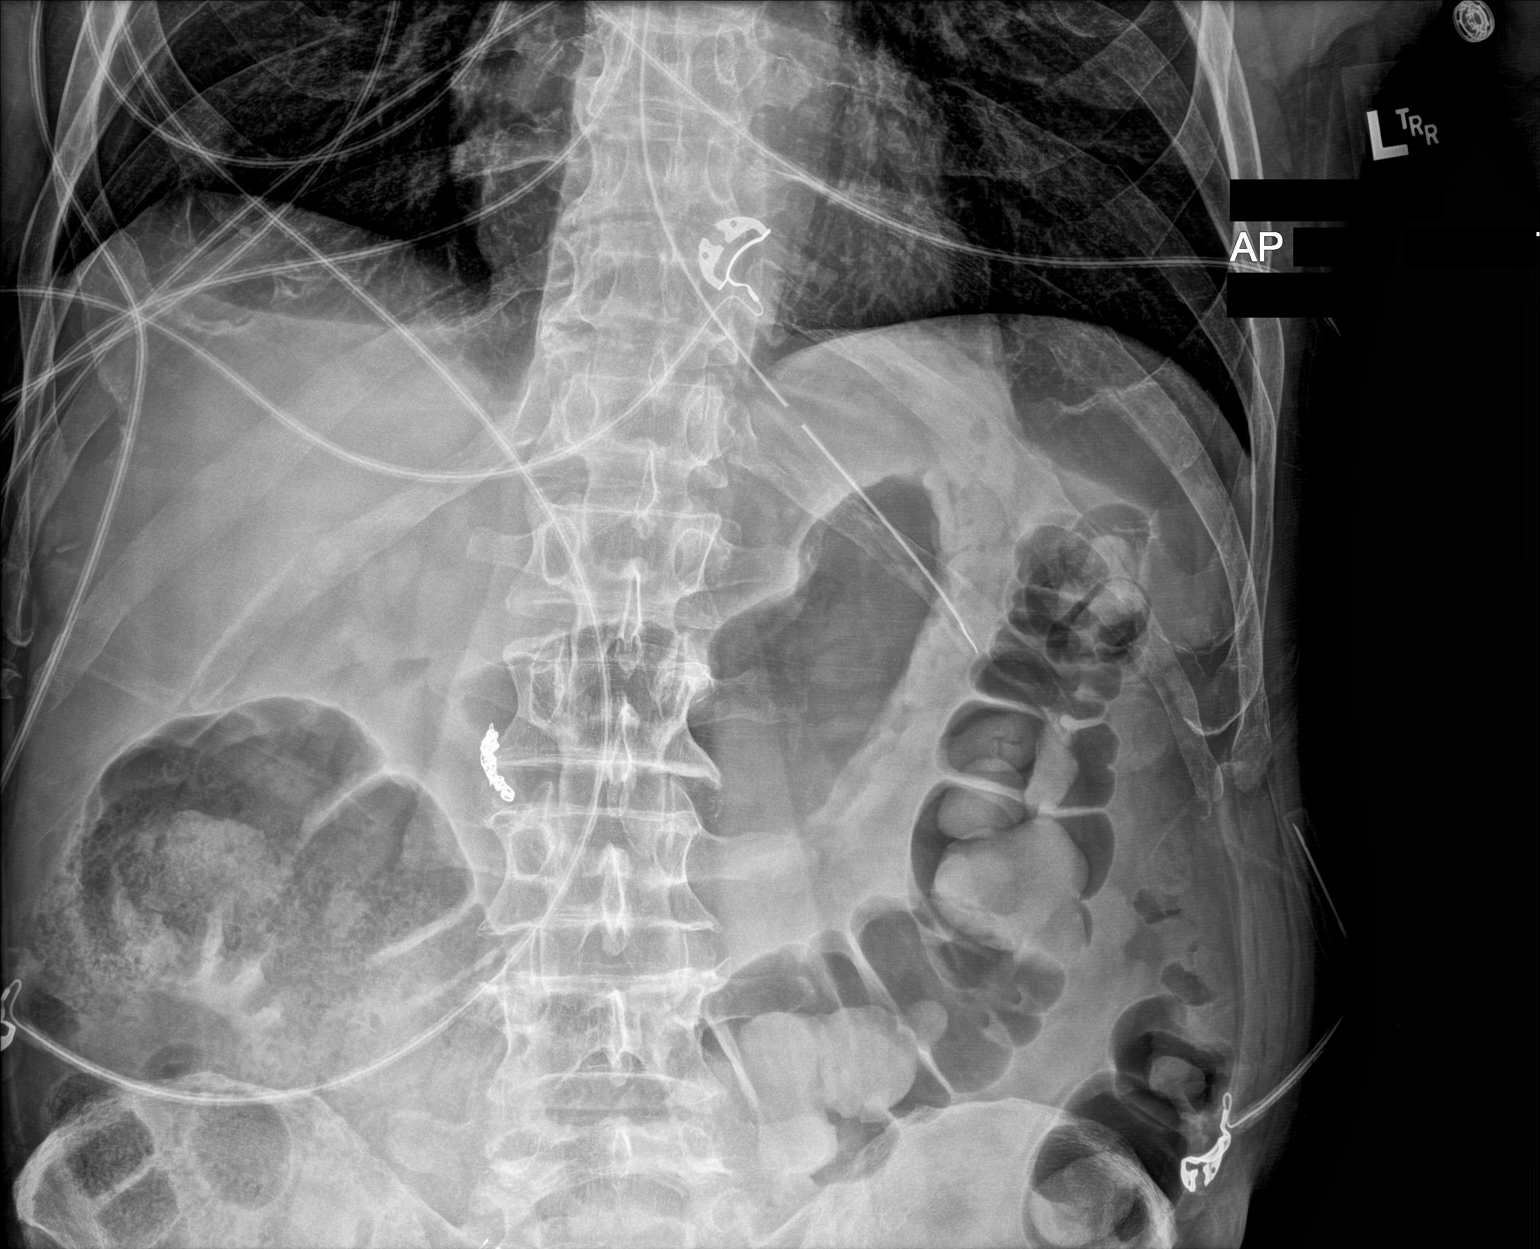

[1 of 1 positions shown; findings below may reference images not displayed]

FINDINGS: Lung bases are clear. Esophageal tube tip overlies the proximal
stomach, side-port at the cardia of the stomach. Large stool in the
upper abdomen. Coils to the right of low L2.
IMPRESSION: Esophageal tube tip overlies the proximal stomach.

## 2021-06-13 DEATH — deceased
